# Patient Record
Sex: Female | Born: 1985 | Race: Black or African American | Hispanic: No | Marital: Single | Smoking: Never smoker
Health system: Southern US, Community
[De-identification: ages and names within clinical notes are randomized; demographics above are authoritative.]

## PROBLEM LIST (undated history)

## (undated) ENCOUNTER — Ambulatory Visit (HOSPITAL_COMMUNITY): Admission: EM | Source: Home / Self Care

## (undated) DIAGNOSIS — Z8751 Personal history of pre-term labor: Secondary | ICD-10-CM

## (undated) DIAGNOSIS — I10 Essential (primary) hypertension: Secondary | ICD-10-CM

## (undated) DIAGNOSIS — O24419 Gestational diabetes mellitus in pregnancy, unspecified control: Secondary | ICD-10-CM

## (undated) HISTORY — PX: IUD REMOVAL: SHX5392

## (undated) HISTORY — DX: Gestational diabetes mellitus in pregnancy, unspecified control: O24.419

## (undated) HISTORY — DX: Personal history of pre-term labor: Z87.51

---
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
## 2006-09-03 ENCOUNTER — Emergency Department (HOSPITAL_COMMUNITY): Admission: EM | Admit: 2006-09-03 | Discharge: 2006-09-03 | Payer: Self-pay | Admitting: Family Medicine

## 2007-08-26 ENCOUNTER — Ambulatory Visit: Payer: Self-pay | Admitting: Obstetrics & Gynecology

## 2007-09-02 ENCOUNTER — Ambulatory Visit (HOSPITAL_COMMUNITY): Admission: RE | Admit: 2007-09-02 | Discharge: 2007-09-02 | Payer: Self-pay | Admitting: Family Medicine

## 2007-09-02 ENCOUNTER — Ambulatory Visit: Payer: Self-pay | Admitting: Obstetrics and Gynecology

## 2007-09-09 ENCOUNTER — Ambulatory Visit: Payer: Self-pay | Admitting: Obstetrics & Gynecology

## 2007-09-16 ENCOUNTER — Ambulatory Visit: Payer: Self-pay | Admitting: Obstetrics and Gynecology

## 2007-09-16 ENCOUNTER — Ambulatory Visit (HOSPITAL_COMMUNITY): Admission: RE | Admit: 2007-09-16 | Discharge: 2007-09-16 | Payer: Self-pay | Admitting: Obstetrics & Gynecology

## 2007-09-23 ENCOUNTER — Ambulatory Visit: Payer: Self-pay | Admitting: Obstetrics & Gynecology

## 2007-09-30 ENCOUNTER — Ambulatory Visit: Payer: Self-pay | Admitting: Obstetrics & Gynecology

## 2007-10-07 ENCOUNTER — Ambulatory Visit: Payer: Self-pay | Admitting: Obstetrics & Gynecology

## 2007-10-14 ENCOUNTER — Ambulatory Visit: Payer: Self-pay | Admitting: *Deleted

## 2007-10-21 ENCOUNTER — Ambulatory Visit: Payer: Self-pay | Admitting: Obstetrics and Gynecology

## 2007-10-28 ENCOUNTER — Ambulatory Visit: Payer: Self-pay | Admitting: Family Medicine

## 2007-11-04 ENCOUNTER — Ambulatory Visit: Payer: Self-pay | Admitting: Obstetrics and Gynecology

## 2007-11-11 ENCOUNTER — Ambulatory Visit: Payer: Self-pay | Admitting: Family Medicine

## 2007-11-18 ENCOUNTER — Ambulatory Visit: Payer: Self-pay | Admitting: Obstetrics & Gynecology

## 2007-11-25 ENCOUNTER — Ambulatory Visit: Payer: Self-pay | Admitting: Obstetrics & Gynecology

## 2007-12-02 ENCOUNTER — Ambulatory Visit: Payer: Self-pay | Admitting: Obstetrics & Gynecology

## 2007-12-09 ENCOUNTER — Ambulatory Visit: Payer: Self-pay | Admitting: Obstetrics & Gynecology

## 2007-12-16 ENCOUNTER — Ambulatory Visit: Payer: Self-pay | Admitting: Obstetrics & Gynecology

## 2007-12-23 ENCOUNTER — Ambulatory Visit: Payer: Self-pay | Admitting: Obstetrics & Gynecology

## 2007-12-30 ENCOUNTER — Ambulatory Visit: Payer: Self-pay | Admitting: Obstetrics & Gynecology

## 2008-01-06 ENCOUNTER — Ambulatory Visit: Payer: Self-pay | Admitting: Family Medicine

## 2008-01-11 ENCOUNTER — Ambulatory Visit (HOSPITAL_COMMUNITY): Admission: RE | Admit: 2008-01-11 | Discharge: 2008-01-11 | Payer: Self-pay | Admitting: Family Medicine

## 2008-01-13 ENCOUNTER — Ambulatory Visit: Payer: Self-pay | Admitting: Obstetrics and Gynecology

## 2008-01-20 ENCOUNTER — Ambulatory Visit: Payer: Self-pay | Admitting: *Deleted

## 2008-01-20 ENCOUNTER — Ambulatory Visit (HOSPITAL_COMMUNITY): Admission: RE | Admit: 2008-01-20 | Discharge: 2008-01-20 | Payer: Self-pay | Admitting: *Deleted

## 2008-01-22 ENCOUNTER — Inpatient Hospital Stay (HOSPITAL_COMMUNITY): Admission: AD | Admit: 2008-01-22 | Discharge: 2008-01-25 | Payer: Self-pay | Admitting: Family Medicine

## 2008-01-22 ENCOUNTER — Inpatient Hospital Stay (HOSPITAL_COMMUNITY): Admission: AD | Admit: 2008-01-22 | Discharge: 2008-01-25 | Payer: Self-pay | Admitting: Obstetrics & Gynecology

## 2008-01-22 ENCOUNTER — Ambulatory Visit: Payer: Self-pay | Admitting: Obstetrics and Gynecology

## 2008-03-22 ENCOUNTER — Emergency Department (HOSPITAL_COMMUNITY): Admission: EM | Admit: 2008-03-22 | Discharge: 2008-03-22 | Payer: Self-pay | Admitting: Emergency Medicine

## 2008-07-19 ENCOUNTER — Emergency Department (HOSPITAL_COMMUNITY): Admission: EM | Admit: 2008-07-19 | Discharge: 2008-07-19 | Payer: Self-pay | Admitting: Emergency Medicine

## 2010-03-29 ENCOUNTER — Inpatient Hospital Stay (HOSPITAL_COMMUNITY): Admission: AD | Admit: 2010-03-29 | Discharge: 2010-03-29 | Payer: Self-pay | Admitting: Obstetrics & Gynecology

## 2010-05-30 ENCOUNTER — Ambulatory Visit: Payer: Self-pay | Admitting: Family Medicine

## 2010-06-13 ENCOUNTER — Ambulatory Visit: Payer: Self-pay | Admitting: Obstetrics and Gynecology

## 2010-06-13 ENCOUNTER — Other Ambulatory Visit: Payer: Self-pay

## 2010-06-13 ENCOUNTER — Encounter: Payer: Self-pay | Admitting: Obstetrics and Gynecology

## 2010-06-13 LAB — CONVERTED CEMR LAB
ALT: 17 units/L (ref 0–35)
AST: 15 units/L (ref 0–37)
Albumin: 3.8 g/dL (ref 3.5–5.2)
Alkaline Phosphatase: 51 units/L (ref 39–117)
BUN: 6 mg/dL (ref 6–23)
CO2: 25 meq/L (ref 19–32)
Calcium: 9.2 mg/dL (ref 8.4–10.5)
Chloride: 103 meq/L (ref 96–112)
Collection Interval-CRCL: 24 hr
Creatinine 24 HR UR: 954 mg/24hr (ref 700–1800)
Creatinine Clearance: 116 mL/min — ABNORMAL HIGH (ref 75–115)
Creatinine, Ser: 0.56 mg/dL (ref 0.40–1.20)
Creatinine, Urine: 119.3 mg/dL
Glucose, Bld: 85 mg/dL (ref 70–99)
HCT: 35.4 % — ABNORMAL LOW (ref 36.0–46.0)
Hemoglobin: 11.7 g/dL — ABNORMAL LOW (ref 12.0–15.0)
MCHC: 33.1 g/dL (ref 30.0–36.0)
MCV: 91.5 fL (ref 78.0–100.0)
Platelets: 254 10*3/uL (ref 150–400)
Potassium: 4.1 meq/L (ref 3.5–5.3)
Protein, Ur: 32 mg/24hr — ABNORMAL LOW (ref 50–100)
RBC: 3.87 M/uL (ref 3.87–5.11)
RDW: 13.4 % (ref 11.5–15.5)
Sodium: 137 meq/L (ref 135–145)
Total Bilirubin: 0.4 mg/dL (ref 0.3–1.2)
Total Protein: 6.6 g/dL (ref 6.0–8.3)
WBC: 8.4 10*3/uL (ref 4.0–10.5)

## 2010-06-19 ENCOUNTER — Ambulatory Visit: Payer: Self-pay | Admitting: Obstetrics and Gynecology

## 2010-06-25 ENCOUNTER — Ambulatory Visit (HOSPITAL_COMMUNITY)
Admission: RE | Admit: 2010-06-25 | Discharge: 2010-06-25 | Payer: Self-pay | Source: Home / Self Care | Admitting: Family Medicine

## 2010-06-25 ENCOUNTER — Encounter: Payer: Self-pay | Admitting: Family Medicine

## 2010-06-26 ENCOUNTER — Ambulatory Visit: Payer: Self-pay | Admitting: Obstetrics and Gynecology

## 2010-07-04 ENCOUNTER — Encounter: Payer: Self-pay | Admitting: Physician Assistant

## 2010-07-04 ENCOUNTER — Ambulatory Visit: Payer: Self-pay | Admitting: Obstetrics & Gynecology

## 2010-07-11 ENCOUNTER — Ambulatory Visit: Payer: Self-pay | Admitting: Obstetrics & Gynecology

## 2010-07-18 ENCOUNTER — Ambulatory Visit: Payer: Self-pay | Admitting: Family Medicine

## 2010-07-25 ENCOUNTER — Ambulatory Visit: Payer: Self-pay | Admitting: Obstetrics & Gynecology

## 2010-08-01 ENCOUNTER — Ambulatory Visit
Admission: RE | Admit: 2010-08-01 | Discharge: 2010-08-01 | Payer: Self-pay | Source: Home / Self Care | Attending: Family Medicine | Admitting: Family Medicine

## 2010-08-01 LAB — POCT URINALYSIS DIPSTICK
Bilirubin Urine: NEGATIVE
Hemoglobin, Urine: NEGATIVE
Ketones, ur: NEGATIVE mg/dL
Nitrite: NEGATIVE
Protein, ur: NEGATIVE mg/dL
Specific Gravity, Urine: 1.02 (ref 1.005–1.030)
Urine Glucose, Fasting: NEGATIVE mg/dL
Urobilinogen, UA: 1 mg/dL (ref 0.0–1.0)
pH: 8 (ref 5.0–8.0)

## 2010-08-08 ENCOUNTER — Ambulatory Visit
Admission: RE | Admit: 2010-08-08 | Discharge: 2010-08-08 | Payer: Self-pay | Source: Home / Self Care | Attending: Obstetrics and Gynecology | Admitting: Obstetrics and Gynecology

## 2010-08-15 ENCOUNTER — Ambulatory Visit
Admission: RE | Admit: 2010-08-15 | Discharge: 2010-08-15 | Payer: Self-pay | Source: Home / Self Care | Attending: Obstetrics & Gynecology | Admitting: Obstetrics & Gynecology

## 2010-08-15 ENCOUNTER — Ambulatory Visit: Admit: 2010-08-15 | Payer: Self-pay | Admitting: Obstetrics and Gynecology

## 2010-08-22 ENCOUNTER — Ambulatory Visit
Admission: RE | Admit: 2010-08-22 | Discharge: 2010-08-22 | Payer: Self-pay | Source: Home / Self Care | Attending: Obstetrics and Gynecology | Admitting: Obstetrics and Gynecology

## 2010-08-22 ENCOUNTER — Encounter: Payer: Self-pay | Admitting: Obstetrics & Gynecology

## 2010-08-22 LAB — POCT URINALYSIS DIPSTICK
Bilirubin Urine: NEGATIVE
Ketones, ur: NEGATIVE mg/dL
Nitrite: NEGATIVE
Protein, ur: NEGATIVE mg/dL
Specific Gravity, Urine: 1.025 (ref 1.005–1.030)
Urine Glucose, Fasting: NEGATIVE mg/dL
Urobilinogen, UA: 1 mg/dL (ref 0.0–1.0)
pH: 7 (ref 5.0–8.0)

## 2010-08-22 LAB — CONVERTED CEMR LAB
HCT: 33.8 % — ABNORMAL LOW (ref 36.0–46.0)
HIV: NONREACTIVE
Hemoglobin: 11 g/dL — ABNORMAL LOW (ref 12.0–15.0)
MCHC: 32.5 g/dL (ref 30.0–36.0)
MCV: 93.4 fL (ref 78.0–100.0)
Platelets: 233 10*3/uL (ref 150–400)
RBC: 3.62 M/uL — ABNORMAL LOW (ref 3.87–5.11)
RDW: 13.7 % (ref 11.5–15.5)
WBC: 7.4 10*3/uL (ref 4.0–10.5)

## 2010-08-29 ENCOUNTER — Ambulatory Visit: Payer: Self-pay

## 2010-08-29 ENCOUNTER — Encounter: Payer: Self-pay | Admitting: Obstetrics & Gynecology

## 2010-08-29 DIAGNOSIS — O09219 Supervision of pregnancy with history of pre-term labor, unspecified trimester: Secondary | ICD-10-CM

## 2010-09-05 ENCOUNTER — Other Ambulatory Visit: Payer: Self-pay

## 2010-09-05 DIAGNOSIS — O09219 Supervision of pregnancy with history of pre-term labor, unspecified trimester: Secondary | ICD-10-CM

## 2010-09-05 LAB — POCT URINALYSIS DIPSTICK
Bilirubin Urine: NEGATIVE
Hgb urine dipstick: NEGATIVE
Ketones, ur: NEGATIVE mg/dL
Nitrite: NEGATIVE
Protein, ur: NEGATIVE mg/dL
Specific Gravity, Urine: 1.02 (ref 1.005–1.030)
Urine Glucose, Fasting: NEGATIVE mg/dL
Urobilinogen, UA: 0.2 mg/dL (ref 0.0–1.0)
pH: 7 (ref 5.0–8.0)

## 2010-09-12 ENCOUNTER — Ambulatory Visit: Payer: Medicaid Other

## 2010-09-12 DIAGNOSIS — O09219 Supervision of pregnancy with history of pre-term labor, unspecified trimester: Secondary | ICD-10-CM

## 2010-09-19 ENCOUNTER — Other Ambulatory Visit: Payer: Self-pay

## 2010-09-19 DIAGNOSIS — O219 Vomiting of pregnancy, unspecified: Secondary | ICD-10-CM

## 2010-09-19 DIAGNOSIS — O09219 Supervision of pregnancy with history of pre-term labor, unspecified trimester: Secondary | ICD-10-CM

## 2010-09-19 LAB — POCT URINALYSIS DIPSTICK
Bilirubin Urine: NEGATIVE
Hgb urine dipstick: NEGATIVE
Ketones, ur: NEGATIVE mg/dL
Nitrite: NEGATIVE
Protein, ur: NEGATIVE mg/dL
Specific Gravity, Urine: 1.02 (ref 1.005–1.030)
Urine Glucose, Fasting: NEGATIVE mg/dL
Urobilinogen, UA: 0.2 mg/dL (ref 0.0–1.0)
pH: 7 (ref 5.0–8.0)

## 2010-09-19 LAB — GLUCOSE, CAPILLARY: Glucose-Capillary: 87 mg/dL (ref 70–99)

## 2010-09-26 ENCOUNTER — Ambulatory Visit: Payer: Medicaid Other

## 2010-09-26 ENCOUNTER — Other Ambulatory Visit: Payer: Self-pay | Admitting: Family Medicine

## 2010-09-26 DIAGNOSIS — I10 Essential (primary) hypertension: Secondary | ICD-10-CM

## 2010-09-26 DIAGNOSIS — O09219 Supervision of pregnancy with history of pre-term labor, unspecified trimester: Secondary | ICD-10-CM

## 2010-09-26 DIAGNOSIS — Z8751 Personal history of pre-term labor: Secondary | ICD-10-CM

## 2010-09-30 ENCOUNTER — Ambulatory Visit (HOSPITAL_COMMUNITY)
Admission: RE | Admit: 2010-09-30 | Discharge: 2010-09-30 | Disposition: A | Discharge status: Home / Self Care | Payer: Medicaid Other | Source: Ambulatory Visit | Attending: Family Medicine | Admitting: Family Medicine

## 2010-09-30 ENCOUNTER — Inpatient Hospital Stay (HOSPITAL_COMMUNITY)
Admission: AD | Admit: 2010-09-30 | Discharge: 2010-10-01 | DRG: 778 | Disposition: A | Discharge status: Home / Self Care | Payer: Medicaid Other | Source: Ambulatory Visit | Attending: Obstetrics & Gynecology | Admitting: Obstetrics & Gynecology

## 2010-09-30 ENCOUNTER — Encounter (HOSPITAL_COMMUNITY): Payer: Self-pay

## 2010-09-30 DIAGNOSIS — Z8751 Personal history of pre-term labor: Secondary | ICD-10-CM

## 2010-09-30 DIAGNOSIS — O139 Gestational [pregnancy-induced] hypertension without significant proteinuria, unspecified trimester: Secondary | ICD-10-CM | POA: Insufficient documentation

## 2010-09-30 DIAGNOSIS — I10 Essential (primary) hypertension: Secondary | ICD-10-CM

## 2010-09-30 DIAGNOSIS — O26879 Cervical shortening, unspecified trimester: Secondary | ICD-10-CM | POA: Diagnosis present

## 2010-09-30 DIAGNOSIS — Z3689 Encounter for other specified antenatal screening: Secondary | ICD-10-CM | POA: Insufficient documentation

## 2010-09-30 DIAGNOSIS — O4700 False labor before 37 completed weeks of gestation, unspecified trimester: Principal | ICD-10-CM | POA: Diagnosis present

## 2010-10-01 LAB — GLUCOSE, CAPILLARY: Glucose-Capillary: 142 mg/dL — ABNORMAL HIGH (ref 70–99)

## 2010-10-03 ENCOUNTER — Other Ambulatory Visit: Payer: Self-pay

## 2010-10-03 DIAGNOSIS — O09219 Supervision of pregnancy with history of pre-term labor, unspecified trimester: Secondary | ICD-10-CM

## 2010-10-03 DIAGNOSIS — O169 Unspecified maternal hypertension, unspecified trimester: Secondary | ICD-10-CM

## 2010-10-03 LAB — POCT URINALYSIS DIPSTICK
Bilirubin Urine: NEGATIVE
Glucose, UA: NEGATIVE mg/dL
Ketones, ur: NEGATIVE mg/dL
Nitrite: NEGATIVE
Protein, ur: 30 mg/dL — AB
Specific Gravity, Urine: 1.02 (ref 1.005–1.030)
Urobilinogen, UA: 0.2 mg/dL (ref 0.0–1.0)
pH: 7 (ref 5.0–8.0)

## 2010-10-04 NOTE — Discharge Summary (Signed)
  NAME:  Mcphillips, Annis                  ACCOUNT NO.:  [id number]  MEDICAL RECORD NO.:  [id number]           PATIENT TYPE:  I  LOCATION:  9155                          [facility]  [facility]  PHYSICIAN:  Scheryl Darter, MD       DATE OF BIRTH:  1985/11/01  DATE OF ADMISSION:  09/30/2010 DATE OF DISCHARGE:  10/01/2010                              DISCHARGE SUMMARY   DIAGNOSES: 1. Intrauterine pregnancy at 32 weeks and 3 days' gestation. 2. History of preterm birth. 3. Threatened preterm labor.  The patient is a 25 year old black female, gravida 3, para 0-2-0-2, estimated date of confinement November 23, 2010.  She presented with ultrasound that demonstrated short cervix and cervical funneling. Because of her history preterm birth, she was receiving 17- hydroxyprogesterone caproate injections weekly at the [facility] at [facility].  The patient denied contractions, cramps, vaginal bleeding, or leaking fluid.  She had a good fetal movement.  PAST MEDICAL HISTORY:  Hypertension during her first pregnancy.  PAST SURGICAL HISTORY:  None.  MEDICATIONS: 1. 17-hydroxyprogesterone caproate 250 mg IM very week. 2. Tums daily for heartburn. 3. Prenatal vitamin daily.  No known drug allergies.  PHYSICAL EXAMINATION:  GENERAL:  No acute distress. VITAL SIGNS:  Stable, afebrile. CHEST:  Clear. HEART:  Regular rate and rhythm. ABDOMEN:  Gravid consistent with dates, nontender. PELVIC:  Cervix was 1.5 cm dilated, 80% effaced, -2 station, lower uterine segment bulging.  Ultrasound showed cervix 1.4 cm long with funneling.  Normal amniotic fluid.  Normal growth.  The patient was admitted to antenatal for observation.  She had contractions demonstrated on monitor about every 10 minutes.  She was given Procardia p.o. and contractions dissipated.  She had contractions next day.  She received two doses of betamethasone 12 mg IM 24 hours apart.  Cervix was checked at 1500 hours on October 01, 2010  and cervix was 50% effaced, fingertip, -3 station, posterior.  IMPRESSION: 1. Intrauterine pregnancy at 32 weeks, 3 days' gestation with     threatened preterm labor. 2. Short cervix. 3. History of preterm birth.  PLAN:  The patient was discharged home.  She was continued Procardia 30 mg XL 1 p.o. daily.  She was scheduled to return on October 03, 2010 at [facility] for her next 17 P injection.  Because of her heart burn, which was not relieved by antacids, I gave her a prescription for Protonix 40 mg p.o. daily.  I gave her precautions for preterm labor.     Scheryl Darter, MD     JA/MEDQ  D:  10/01/2010  T:  10/02/2010  Job:  [id number]  Electronically Signed by Scheryl Darter MD on 10/03/2010 01:54:36 PM

## 2010-10-07 LAB — POCT URINALYSIS DIPSTICK
Bilirubin Urine: NEGATIVE
Bilirubin Urine: NEGATIVE
Glucose, UA: NEGATIVE mg/dL
Glucose, UA: NEGATIVE mg/dL
Hgb urine dipstick: NEGATIVE
Ketones, ur: NEGATIVE mg/dL
Ketones, ur: NEGATIVE mg/dL
Nitrite: NEGATIVE
Nitrite: NEGATIVE
Protein, ur: NEGATIVE mg/dL
Protein, ur: NEGATIVE mg/dL
Specific Gravity, Urine: 1.02 (ref 1.005–1.030)
Specific Gravity, Urine: 1.02 (ref 1.005–1.030)
Urobilinogen, UA: 1 mg/dL (ref 0.0–1.0)
Urobilinogen, UA: 0.2 mg/dL (ref 0.0–1.0)
pH: 8.5 — ABNORMAL HIGH (ref 5.0–8.0)
pH: 7.5 (ref 5.0–8.0)

## 2010-10-08 LAB — POCT URINALYSIS DIPSTICK
Bilirubin Urine: NEGATIVE
Bilirubin Urine: NEGATIVE
Bilirubin Urine: NEGATIVE
Glucose, UA: NEGATIVE mg/dL
Glucose, UA: NEGATIVE mg/dL
Glucose, UA: NEGATIVE mg/dL
Hgb urine dipstick: NEGATIVE
Ketones, ur: NEGATIVE mg/dL
Ketones, ur: NEGATIVE mg/dL
Ketones, ur: NEGATIVE mg/dL
Nitrite: NEGATIVE
Nitrite: NEGATIVE
Nitrite: NEGATIVE
Protein, ur: NEGATIVE mg/dL
Protein, ur: NEGATIVE mg/dL
Protein, ur: NEGATIVE mg/dL
Specific Gravity, Urine: 1.025 (ref 1.005–1.030)
Specific Gravity, Urine: 1.025 (ref 1.005–1.030)
Specific Gravity, Urine: >=1.03 (ref 1.005–1.030)
Urobilinogen, UA: 0.2 mg/dL (ref 0.0–1.0)
Urobilinogen, UA: 0.2 mg/dL (ref 0.0–1.0)
Urobilinogen, UA: 1 mg/dL (ref 0.0–1.0)
pH: 7 (ref 5.0–8.0)
pH: 7 (ref 5.0–8.0)
pH: 7.5 (ref 5.0–8.0)

## 2010-10-10 ENCOUNTER — Other Ambulatory Visit: Payer: Medicaid Other

## 2010-10-10 ENCOUNTER — Other Ambulatory Visit: Payer: Self-pay

## 2010-10-10 DIAGNOSIS — O09219 Supervision of pregnancy with history of pre-term labor, unspecified trimester: Secondary | ICD-10-CM

## 2010-10-10 LAB — URINALYSIS, ROUTINE W REFLEX MICROSCOPIC
Bilirubin Urine: NEGATIVE
Glucose, UA: NEGATIVE mg/dL
Ketones, ur: NEGATIVE mg/dL
Nitrite: NEGATIVE
Protein, ur: NEGATIVE mg/dL
Specific Gravity, Urine: 1.02 (ref 1.005–1.030)
Urobilinogen, UA: 0.2 mg/dL (ref 0.0–1.0)
pH: 6.5 (ref 5.0–8.0)

## 2010-10-10 LAB — GLUCOSE, CAPILLARY: Glucose-Capillary: 96 mg/dL (ref 70–99)

## 2010-10-10 LAB — CBC
MCHC: 33.9 g/dL (ref 30.0–36.0)
Platelets: 257 10*3/uL (ref 150–400)
RDW: 13.2 % (ref 11.5–15.5)
WBC: 6.7 10*3/uL (ref 4.0–10.5)

## 2010-10-10 LAB — URINE MICROSCOPIC-ADD ON

## 2010-10-10 LAB — URINE CULTURE
Colony Count: >=100000
Culture  Setup Time: 201109022026

## 2010-10-10 LAB — HCG, QUANTITATIVE, PREGNANCY: hCG, Beta Chain, Quant, S: 16953 m[IU]/mL — ABNORMAL HIGH (ref <5)

## 2010-10-10 LAB — WET PREP, GENITAL: Yeast Wet Prep HPF POC: NONE SEEN

## 2010-10-10 LAB — ABO/RH: ABO/RH(D): O POS

## 2010-10-10 LAB — POCT PREGNANCY, URINE: Preg Test, Ur: POSITIVE

## 2010-10-17 ENCOUNTER — Other Ambulatory Visit: Payer: Self-pay | Admitting: Obstetrics & Gynecology

## 2010-10-17 DIAGNOSIS — O09219 Supervision of pregnancy with history of pre-term labor, unspecified trimester: Secondary | ICD-10-CM

## 2010-10-17 LAB — POCT URINALYSIS DIP (DEVICE)
Glucose, UA: NEGATIVE mg/dL
Ketones, ur: 80 mg/dL — AB
Nitrite: POSITIVE — AB
pH: 6 (ref 5.0–8.0)

## 2010-10-18 ENCOUNTER — Encounter: Payer: Self-pay | Admitting: Obstetrics & Gynecology

## 2010-10-24 ENCOUNTER — Ambulatory Visit: Payer: Medicaid Other

## 2010-10-24 DIAGNOSIS — O09219 Supervision of pregnancy with history of pre-term labor, unspecified trimester: Secondary | ICD-10-CM

## 2010-10-31 ENCOUNTER — Other Ambulatory Visit: Payer: Self-pay | Admitting: Obstetrics and Gynecology

## 2010-10-31 DIAGNOSIS — O09219 Supervision of pregnancy with history of pre-term labor, unspecified trimester: Secondary | ICD-10-CM

## 2010-10-31 LAB — POCT URINALYSIS DIP (DEVICE)
Bilirubin Urine: NEGATIVE
Ketones, ur: NEGATIVE mg/dL
Protein, ur: 30 mg/dL — AB
Specific Gravity, Urine: 1.02 (ref 1.005–1.030)
pH: 7 (ref 5.0–8.0)

## 2010-11-01 ENCOUNTER — Inpatient Hospital Stay (HOSPITAL_COMMUNITY)
Admission: AD | Admit: 2010-11-01 | Discharge: 2010-11-04 | DRG: 775 | Disposition: A | Discharge status: Home / Self Care | Payer: Medicaid Other | Source: Ambulatory Visit | Attending: Obstetrics & Gynecology | Admitting: Obstetrics & Gynecology

## 2010-11-01 LAB — CBC
HCT: 37.1 % (ref 36.0–46.0)
Hemoglobin: 12.3 g/dL (ref 12.0–15.0)
MCHC: 33.2 g/dL (ref 30.0–36.0)
MCV: 92.8 fL (ref 78.0–100.0)
WBC: 9 10*3/uL (ref 4.0–10.5)

## 2010-11-15 ENCOUNTER — Ambulatory Visit: Payer: Medicaid Other | Admitting: Family Medicine

## 2010-11-18 ENCOUNTER — Ambulatory Visit (INDEPENDENT_AMBULATORY_CARE_PROVIDER_SITE_OTHER): Payer: Medicaid Other | Admitting: Physician Assistant

## 2010-11-18 DIAGNOSIS — F329 Major depressive disorder, single episode, unspecified: Secondary | ICD-10-CM

## 2010-11-19 NOTE — Group Therapy Note (Signed)
NAME:  [name] [name]                  ACCOUNT NO.:  [id number]  MEDICAL RECORD NO.:  [id number]           PATIENT TYPE:  A  LOCATION:  [facility]                   FACILITY:  [facility]  PHYSICIAN:  Maylon Cos, CNM    DATE OF BIRTH:  09/20/1985  DATE OF SERVICE:  11/18/2010                                 CLINIC NOTE  The patient is being seen in GYN Clinic at [facility].  REASON FOR TODAY'S VISIT:  Two week postpartum exam and complaints of depression.  HISTORY OF PRESENT ILLNESS:  The patient is 2 weeks status post spontaneous vaginal delivery of a live female infant weighing 5 pounds and 9 ounces with a 9 and 9 Apgars.  Infant was delivered by [name] [name], nurse midwife.  Her pregnancy was complicated by history of 2 preterm births and chronic hypertension.  She returns today with complaints of depression.  She has uncontrolled crying and does state that approximately 5 days after delivery, she had thoughts of harming the baby, she did call her sister.  Her sister quickly came over and she was able to remove herself from the situation.  She states that she is now having improved bonding with the baby.  She is now picking the baby up where as the first week of life, she was not bonding at all, not picking the baby up at all.  She is tearful during our interaction and is desiring to help for her symptoms.  OTHER COMPLICATIONS:  She has had many tragedies in her recent history. Her boyfriend passed away in a shooting on New Year's Eve and her brother who she is very close to was recently incarcerated.  Her support system is mentally her sister right now who is also pregnant and is preoccupied by the upcoming arrival of her new baby.  Her [place] postpartum depression screen today scores an 18.  She is amenable to counseling and medication which we will start.  PHYSICAL EXAMINATION:  GENERAL:  Natasha [name] is a 25 year old African American female who is in no apparent distress. HEENT:   Grossly normal. VITAL SIGNS:  Stable.  Her temperature is 99.1, her pulse is 95, her blood pressure is 134/91, her weight is 134.2, and her height is 60 inches secondary to no complaints of the physical nature.  All her further examination was deferred secondary to the priority of her emotional state.  We will recheck blood pressure prior to leaving clinic today.  ASSESSMENT:  Postpartum depression, moderate to severe.  PLAN: 1. Start Zoloft 50 mg one p.o. bedtime. 2. Social worker to see patient in clinic today to refer for     Counseling Services immediately. 3. We have reviewed emergency need, if she is feeling symptoms of     harming herself or her baby, she is unable to stay at her resources     in the community and going to the ER.  FOLLOWUP:  The patient should follow up in 3 weeks in clinic or p.r.n. before if needed.          ______________________________ Maylon Cos, CNM    SS/MEDQ  D:  11/18/2010  T:  11/19/2010  Job:  [id number]

## 2010-11-25 ENCOUNTER — Ambulatory Visit: Payer: Medicaid Other | Admitting: Physician Assistant

## 2010-12-11 ENCOUNTER — Other Ambulatory Visit: Payer: Self-pay | Admitting: Physician Assistant

## 2010-12-11 ENCOUNTER — Ambulatory Visit (INDEPENDENT_AMBULATORY_CARE_PROVIDER_SITE_OTHER): Payer: Medicaid Other | Admitting: Physician Assistant

## 2010-12-11 DIAGNOSIS — Z30430 Encounter for insertion of intrauterine contraceptive device: Secondary | ICD-10-CM

## 2010-12-11 LAB — POCT PREGNANCY, URINE: Preg Test, Ur: NEGATIVE

## 2010-12-12 NOTE — Group Therapy Note (Signed)
NAME:  Natasha [name], Natasha [name]                  ACCOUNT NO.:  [id number]  MEDICAL RECORD NO.:  [id number]           PATIENT TYPE:  A  LOCATION:  [facility]                   FACILITY:  [facility]  PHYSICIAN:  Maylon Cos, CNM    DATE OF BIRTH:  04-Apr-1986  DATE OF SERVICE:  12/11/2010                                 CLINIC NOTE  REASON FOR TODAY'S VISIT:  IUD insertion and recheck of postpartum depression.  She is approximately 4 weeks status post from her previous visit, at which time she was 2 weeks postpartum with complaints of depression.  At that time, she was started on 50 mg of Zoloft at bedtime.  She says that she is feeling much better if she elected the Zoloft twice and she does state that she is feeling better, not feeling that she needs medications at this time.  She has not followed up with the social worker community counseling as recommended, however, she does have community resources available and is able to verbalize them.  She is interested in having Mirena IUD inserted today.  Her informed consent was obtained and time- out was done.  The patient was placed in lithotomy position.  Graves speculum was used to visualize the paracervix which was visually dilated to approximately 1/2 cm.  Cervix was prepared with Betadine solution x3. Tenaculum was placed at 4 o'clock on the cervix and uterus was sounded to approximately 7 cm.  Mirena IUD introducer was calibrated to the same and Mirena IUD was inserted without difficulty into the uterus.  IUD strings were trimmed to approximately 1 cm below the external os. Tenaculum was removed and Fox swab was used to remain hemostasis from the removal of the tenaculum.  Speculum was removed.  The patient tolerated this procedure well.  Post medications of 600 mg of ibuprofen were given for uterine cramping.  ASSESSMENT: 1. Postpartum depression, stable. 2. Mirena intrauterine device insertion.  PLAN: 1. The patient should follow up in 5-6  weeks for IUD string check. 2. The patient should follow up with community counseling and social     work as needed for depression management or sooner here in our     clinic p.r.n. problems.          ______________________________ Maylon Cos, CNM    SS/MEDQ  D:  12/11/2010  T:  12/12/2010  Job:  [id number]

## 2010-12-13 NOTE — Discharge Summary (Signed)
[facility]  Patient:    Natasha [name], Natasha [name] Visit Number: [id number] MRN: [id number]          Service Type: OBS Location: 910A 9109 [id number] Attending Physician:  Antionette Char Dictated by:   Marvis Moeller, C.N.M. Adm. Date:  [id number] Disc. Date: [id number]                             Discharge Summary  ADMISSION DIAGNOSES:          1. A 29-2/7 weeks intrauterine pregnancy.                               2. Preterm cervical contractions.  DISCHARGE DIAGNOSES:          1. A 30-3/7 weeks intrauterine pregnancy.                               2. Trichomonas vaginitis.                               3. Chlamydia cervicitis.                               4. Preterm contractions resolved.  HISTORY:                      This is a 25 year old primigravida who presented with preterm uterine contractions noted at her clinic visit noted earlier in the day. She was sent to [facility] for further evaluation. Her best dating criteria was by a 26-week ultrasound. It was nearly three weeks discrepant with her menstrual dating history. She was unaware of having contractions. She denied leaking or bleeding. Fetal activity was good. She had reported a yellow greenish vaginal discharge. Her cultures for beta strep and STDs had been done at the clinic prior to her arrival at [facility]. Her wet prep showed too-numerous-too-count WBCs, 3+ bacteria, and large amount of Trichomonas.  PREGNANCY COURSE:             Her prenatal care was at [facility] with onset at 23 weeks and to this point had been uncomplicated.  MEDICATIONS:                  Prenatal vitamin one a day.  ALLERGIES:                    None.  OBSTETRICAL HISTORY:          Primigravida.  MEDICAL AND GYNECOLOGIC HISTORY:                      Significant of having had Chlamydia and Trichomonas in the past.  FAMILY AND SOCIAL HISTORY:    She is single. Denied any drug, alcohol,  tobacco use.  PRENATAL LABORATORY DATA:     Significant for negative GC, positive Chlamydia, which was treated a couple of weeks ago at [facility] and she had also had Trichomonas vaginitis during the pregnancy.  Her ultrasound done at 26 weeks gave a cervical length of 3.1 and normal fluid.  ADMISSION PHYSICAL EXAMINATION:  Vital signs were stable with blood pressure 126/71, temperature 98.5. General: NAD. Abdomen was nontender. There was no edema in her lower extremities. Heart and lung exam were later found to be normal. Her speculum exam was not repeated on admission. The digital cervical exam showed a cervix that was 1 cm, posterior, 20% with a vertex at a high station. Fetal heart rate was 135 with good variability and no decelerations. Contractions were occurring every seven to 12 minutes lasting about 90 seconds and there was also uterine irritability.  HOSPITAL COURSE:              The patient was given IV antibiotics, Unasyn, and it was noted that her contractions continued onto day #2 of hospitalization despite being given the Unasyn and Procardia. For that reason, she was started on magnesium sulfate 4 gram load and 2 g/hr. This was discontinued one day later since her cervix exam was unchanged and she was not having uterine activity. On day of discharge, she was placed on oral terbutaline x one day which was controlling her uterine activity very well. She denied leaking or bleeding. She was not feeling any contractions. Vital signs were stable. An ultrasound done on January 26, 2001 showed good fetal growth and a cervical length of 3.3. The vaginal exam was posterior, external fingertip, long; some lower uterine segment development. Fetal heart rate was stable, 155 baseline. She was discharged home on modified bed rest with oral terbutaline 2.5 mg one p.o. q.4h. and prenatal vitamin one p.o. q.d. She expressed that she would be able to follow the pelvic  precautions and the modified bed rest.  DISCHARGE FOLLOWUP:           The patient is to follow up at [facility] in one week.  CONDITION ON DISCHARGE:       Good. DD:  03/15/01 TD:  03/15/01 Job: [id number]

## 2011-01-16 ENCOUNTER — Other Ambulatory Visit: Payer: Self-pay | Admitting: Obstetrics & Gynecology

## 2011-01-16 ENCOUNTER — Ambulatory Visit (INDEPENDENT_AMBULATORY_CARE_PROVIDER_SITE_OTHER): Payer: Medicaid Other | Admitting: Physician Assistant

## 2011-01-16 DIAGNOSIS — Z30431 Encounter for routine checking of intrauterine contraceptive device: Secondary | ICD-10-CM

## 2011-01-16 DIAGNOSIS — K113 Abscess of salivary gland: Secondary | ICD-10-CM

## 2011-01-22 ENCOUNTER — Ambulatory Visit (HOSPITAL_COMMUNITY)
Admission: RE | Admit: 2011-01-22 | Discharge: 2011-01-22 | Disposition: A | Discharge status: Home / Self Care | Payer: Medicaid Other | Source: Ambulatory Visit | Attending: Family Medicine | Admitting: Family Medicine

## 2011-01-22 ENCOUNTER — Other Ambulatory Visit: Payer: Self-pay | Admitting: Family Medicine

## 2011-01-22 ENCOUNTER — Other Ambulatory Visit (HOSPITAL_COMMUNITY): Payer: Medicaid Other

## 2011-01-22 ENCOUNTER — Ambulatory Visit: Payer: Medicaid Other | Admitting: Obstetrics & Gynecology

## 2011-01-22 ENCOUNTER — Ambulatory Visit (HOSPITAL_COMMUNITY): Payer: Medicaid Other

## 2011-01-22 ENCOUNTER — Ambulatory Visit (HOSPITAL_COMMUNITY)
Admission: RE | Admit: 2011-01-22 | Discharge: 2011-01-22 | Disposition: A | Discharge status: Home / Self Care | Payer: Medicaid Other | Source: Ambulatory Visit | Attending: Obstetrics & Gynecology | Admitting: Obstetrics & Gynecology

## 2011-01-22 ENCOUNTER — Encounter (HOSPITAL_COMMUNITY): Payer: Self-pay

## 2011-01-22 DIAGNOSIS — Z3049 Encounter for surveillance of other contraceptives: Secondary | ICD-10-CM

## 2011-01-22 DIAGNOSIS — Z01818 Encounter for other preprocedural examination: Secondary | ICD-10-CM

## 2011-01-22 DIAGNOSIS — Z30431 Encounter for routine checking of intrauterine contraceptive device: Secondary | ICD-10-CM | POA: Insufficient documentation

## 2011-01-22 DIAGNOSIS — N83209 Unspecified ovarian cyst, unspecified side: Secondary | ICD-10-CM | POA: Insufficient documentation

## 2011-01-22 DIAGNOSIS — T839XXA Unspecified complication of genitourinary prosthetic device, implant and graft, initial encounter: Secondary | ICD-10-CM

## 2011-01-22 DIAGNOSIS — N949 Unspecified condition associated with female genital organs and menstrual cycle: Secondary | ICD-10-CM | POA: Insufficient documentation

## 2011-01-22 MED ORDER — IOHEXOL 300 MG/ML  SOLN
100.0000 mL | Freq: Once | INTRAMUSCULAR | Status: AC | PRN
  Administered 2011-01-22: 100 mL via INTRAVENOUS

## 2011-01-23 NOTE — Group Therapy Note (Signed)
NAME:  Natasha [name], Natasha [name]                  ACCOUNT NO.:  [id number]  MEDICAL RECORD NO.:  [id number]           PATIENT TYPE:  A  LOCATION:  [facility]                   [facility]ACILITY:  [facility]  PHYSICIAN:  Jaynie Collins, MD     DATE OF BIRTH:  May 06, 1986  DATE OF SERVICE:  01/22/2011                                 CLINIC NOTE  REASON FOR VISIT:  Discussion of management of misplaced IUD.  HISTORY OF PRESENT ILLNESS:  The patient is a 25 year old gravida 3 para 1-2-0-3 who is here after recent diagnosis of a misplaced IUD.  The patient had a Mirena that was placed on Dec 11, 2010, without any complication.  However, when she came for her string check, the IUD strings were not able to be visualized, and she was sent for ultrasound. The ultrasound did not visualize the IUD and so she underwent more imaging modalities including a CT scan which apparently showed that the IUD was outside the uterus and overlying the sigmoid colon.  The patient was sent down here for further discussion and management.  She does report having occasional abdominal cramping but no other concerns.  PAST MEDICAL AND SURGICAL HISTORY:  Unremarkable.  PAST OB/GYN HISTORY:  The patient has had all vaginal deliveries.  She does have a history of Trichomonas and Chlamydia and abnormal Pap smears in the past but none recently.  MEDICATIONS:  Mirena IUD.  ALLERGIES:  No known drug allergies.  SOCIAL HISTORY:  The patient denies any smoking, alcohol, or illicit drug use.  FAMILY HISTORY:  Noncontributory.  PHYSICAL EXAMINATION:  VITAL SIGNS:  Temperature is 98.8, pulse 74, blood pressure 124/82, weight 136.7 pounds, height 60 inches. GENERAL:  No apparent distress. ABDOMEN:  Soft, nontender, and nondistended. LUNGS:  Clear to auscultation bilaterally. HEART:  Regular rate and rhythm. EXTREMITIES:  No cyanosis, clubbing, or edema. PELVIC:  Deferred.  ASSESSMENT/PLAN:  The patient is a 25 year old gravida 3, para  1-2-0-3 with a misplaced IUD.  The patient was counseled regarding needing laparoscopy and removal of the IUD.  The risk of the surgery were explained in detail including but not limited to bleeding, infection, injury to surrounding organs, need for additional procedures.  All her questions were answered.  The patient does say she is going to use condoms in the meantime for contraception and is planning to Implanon after all this is said and done.  Her surgery was booked on February 06, 2011 at 3:00 p.m. and routine preoperative instructions were given to the patient.  She was told to call or come back in if she has any worsening pain or any other concerning symptoms.          ______________________________ Jaynie Collins, MD    UA/MEDQ  D:  01/22/2011  T:  01/23/2011  Job:  [id number]

## 2011-02-04 ENCOUNTER — Encounter (HOSPITAL_COMMUNITY)
Admission: RE | Admit: 2011-02-04 | Discharge: 2011-02-04 | Disposition: A | Discharge status: Home / Self Care | Payer: Medicaid Other | Source: Ambulatory Visit | Attending: Obstetrics & Gynecology | Admitting: Obstetrics & Gynecology

## 2011-02-04 ENCOUNTER — Encounter (HOSPITAL_COMMUNITY): Payer: Self-pay

## 2011-02-04 LAB — CBC
HCT: 40.3 % (ref 36.0–46.0)
Hemoglobin: 13 g/dL (ref 12.0–15.0)
MCV: 93.9 fL (ref 78.0–100.0)
WBC: 7.4 10*3/uL (ref 4.0–10.5)

## 2011-02-04 LAB — SURGICAL PCR SCREEN: Staphylococcus aureus: NEGATIVE

## 2011-02-04 NOTE — Patient Instructions (Addendum)
20 Natasha [name]  02/04/2011   Your procedure is scheduled on:  Thursday  Report to [facility] at1:30PM Call this number if you have problems the morning of surgery: [phone]   Remember:   Do not eat food:After Midnight.  Do not drink clear liquids: 4 Hours before arrival.  Take these medicines the morning of surgery with A SIP OF WATER: N/A   Do not wear jewelry, make-up or nail polish.  Do not bring valuables to the hospital.  Contacts, dentures or bridgework may not be worn into surgery.  Leave suitcase in the car. After surgery it may be brought to your room.  For patients admitted to the hospital, checkout time is 11:00 AM the day of discharge.   Patients discharged the day of surgery will not be allowed to drive home.  Name and phone number of your driver: x  Special Instructions:    Please read over the following fact sheets that you were given:

## 2011-02-05 NOTE — H&P (Signed)
  REASON FOR VISIT: Discussion of management of misplaced IUD.  HISTORY OF PRESENT ILLNESS: The patient is Natasha [name] 25 year old gravida 3 para 1-2-0-3 who is here after recent diagnosis of Natasha [name] misplaced IUD. The patient had Natasha [name] Mirena that was placed on Dec 11, 2010, without any complication. However, when she came for her string check, the IUD strings were not able to be visualized, and she was sent for ultrasound. The ultrasound did not visualize the IUD and so she underwent more imaging modalities including Natasha [name] CT scan which apparently showed that the IUD was outside the uterus and overlying the sigmoid colon. The patient was sent to clinic for further discussion and management. She does report having occasional abdominal cramping but no other concerns.  PAST MEDICAL AND SURGICAL HISTORY: Unremarkable.  PAST OB/GYN HISTORY: The patient has had all vaginal deliveries. She does have Natasha [name] history of Trichomonas and Chlamydia and abnormal Pap smears in the past but none recently.  MEDICATIONS: Mirena IUD.  ALLERGIES: No known drug allergies.  SOCIAL HISTORY: The patient denies any smoking, alcohol, or illicit drug use.  FAMILY HISTORY: Noncontributory.  PHYSICAL EXAMINATION: VITAL SIGNS: Temperature is 98.4, pulse 80, blood pressure 128/70, weight 136.7 pounds, height 60 inches.  GENERAL: No apparent distress.  ABDOMEN: Soft, nontender, and nondistended.  LUNGS: Clear to auscultation bilaterally.  HEART: Regular rate and rhythm.  EXTREMITIES: No cyanosis, clubbing, or edema.  PELVIC: Deferred.  ASSESSMENT/PLAN: The patient is Natasha [name] 25 year old gravida 3, para 1-2-0-3 with Natasha [name] misplaced IUD. The patient was counseled regarding needing laparoscopy and removal of the IUD. The risks of the surgery were explained in detail including but not limited to bleeding, infection,  injury to surrounding organs, need for additional procedures. All her questions were answered. The patient does say she is going to use condoms in the  meantime for contraception and is planning to use Implanon after surgery. Her surgery was booked on February 06, 2011 at 3:00 p.m. and routine preoperative instructions were given to the patient. She was told to call or come back in if she has any worsening pain or any other concerning symptoms.   Natasha Natasha [name]

## 2011-02-06 ENCOUNTER — Encounter (HOSPITAL_COMMUNITY): Payer: Self-pay | Admitting: Anesthesiology

## 2011-02-06 ENCOUNTER — Ambulatory Visit (HOSPITAL_COMMUNITY)
Admission: AD | Admit: 2011-02-06 | Discharge: 2011-02-06 | Disposition: A | Discharge status: Home / Self Care | Payer: Medicaid Other | Source: Ambulatory Visit | Attending: Obstetrics & Gynecology | Admitting: Obstetrics & Gynecology

## 2011-02-06 ENCOUNTER — Encounter (HOSPITAL_COMMUNITY)
Admission: AD | Disposition: A | Discharge status: Home / Self Care | Payer: Self-pay | Source: Ambulatory Visit | Attending: Obstetrics & Gynecology

## 2011-02-06 ENCOUNTER — Other Ambulatory Visit: Payer: Self-pay | Admitting: Obstetrics & Gynecology

## 2011-02-06 ENCOUNTER — Ambulatory Visit (HOSPITAL_COMMUNITY): Payer: Medicaid Other | Admitting: Anesthesiology

## 2011-02-06 DIAGNOSIS — T839XXA Unspecified complication of genitourinary prosthetic device, implant and graft, initial encounter: Secondary | ICD-10-CM

## 2011-02-06 DIAGNOSIS — T8339XA Other mechanical complication of intrauterine contraceptive device, initial encounter: Secondary | ICD-10-CM | POA: Insufficient documentation

## 2011-02-06 DIAGNOSIS — Z01812 Encounter for preprocedural laboratory examination: Secondary | ICD-10-CM | POA: Insufficient documentation

## 2011-02-06 DIAGNOSIS — IMO0002 Reserved for concepts with insufficient information to code with codable children: Secondary | ICD-10-CM

## 2011-02-06 HISTORY — PX: PROCTOSCOPY: SHX2266

## 2011-02-06 HISTORY — PX: LAPAROSCOPY: SHX197

## 2011-02-06 SURGERY — LAPAROSCOPY OPERATIVE
Anesthesia: General | Site: Rectum | Wound class: Clean Contaminated

## 2011-02-06 MED ORDER — LACTATED RINGERS IV SOLN
INTRAVENOUS | Status: DC
  Administered 2011-02-06: 14:00:00 via INTRAVENOUS

## 2011-02-06 MED ORDER — CEFAZOLIN SODIUM 1-5 GM-% IV SOLN
INTRAVENOUS | Status: AC
  Filled 2011-02-06: qty 50

## 2011-02-06 MED ORDER — LACTATED RINGERS IR SOLN
Status: DC | PRN
  Administered 2011-02-06: 3000 mL

## 2011-02-06 MED ORDER — PROPOFOL 10 MG/ML IV EMUL
INTRAVENOUS | Status: DC | PRN
  Administered 2011-02-06: 150 mg via INTRAVENOUS

## 2011-02-06 MED ORDER — LIDOCAINE HCL (CARDIAC) 20 MG/ML IV SOLN
INTRAVENOUS | Status: AC
  Filled 2011-02-06: qty 5

## 2011-02-06 MED ORDER — KETOROLAC TROMETHAMINE 30 MG/ML IJ SOLN
INTRAMUSCULAR | Status: AC
  Filled 2011-02-06: qty 1

## 2011-02-06 MED ORDER — ROCURONIUM BROMIDE 100 MG/10ML IV SOLN
INTRAVENOUS | Status: DC | PRN
  Administered 2011-02-06: 10 mg via INTRAVENOUS
  Administered 2011-02-06: 5 mg via INTRAVENOUS
  Administered 2011-02-06: 35 mg via INTRAVENOUS

## 2011-02-06 MED ORDER — IBUPROFEN 600 MG PO TABS: 600.0000 mg | ORAL_TABLET | Freq: Four times a day (QID) | ORAL | Status: AC | PRN

## 2011-02-06 MED ORDER — FENTANYL CITRATE 0.05 MG/ML IJ SOLN
INTRAMUSCULAR | Status: AC
  Filled 2011-02-06: qty 5

## 2011-02-06 MED ORDER — ONDANSETRON HCL 4 MG/2ML IJ SOLN
INTRAMUSCULAR | Status: DC | PRN
  Administered 2011-02-06: 4 mg via INTRAVENOUS

## 2011-02-06 MED ORDER — METRONIDAZOLE IN NACL 5-0.79 MG/ML-% IV SOLN
500.0000 mg | INTRAVENOUS | Status: DC
  Filled 2011-02-06: qty 100

## 2011-02-06 MED ORDER — NEOSTIGMINE METHYLSULFATE 1 MG/ML IJ SOLN
INTRAMUSCULAR | Status: DC | PRN
  Administered 2011-02-06: 3 mg via INTRAMUSCULAR

## 2011-02-06 MED ORDER — MIDAZOLAM HCL 2 MG/2ML IJ SOLN
INTRAMUSCULAR | Status: AC
  Filled 2011-02-06: qty 2

## 2011-02-06 MED ORDER — PROPOFOL 10 MG/ML IV EMUL
INTRAVENOUS | Status: AC
  Filled 2011-02-06: qty 20

## 2011-02-06 MED ORDER — OXYCODONE-ACETAMINOPHEN 5-325 MG PO TABS: 1.0000 | ORAL_TABLET | ORAL | Status: AC | PRN

## 2011-02-06 MED ORDER — BUPIVACAINE HCL (PF) 0.25 % IJ SOLN
INTRAMUSCULAR | Status: DC | PRN
  Administered 2011-02-06 (×2): 2 mL

## 2011-02-06 MED ORDER — ROCURONIUM BROMIDE 50 MG/5ML IV SOLN
INTRAVENOUS | Status: AC
  Filled 2011-02-06: qty 1

## 2011-02-06 MED ORDER — METRONIDAZOLE IN NACL 5-0.79 MG/ML-% IV SOLN
INTRAVENOUS | Status: DC | PRN
  Administered 2011-02-06: 500 mg via INTRAVENOUS

## 2011-02-06 MED ORDER — FENTANYL CITRATE 0.05 MG/ML IJ SOLN
INTRAMUSCULAR | Status: DC | PRN
  Administered 2011-02-06: 100 ug via INTRAVENOUS
  Administered 2011-02-06: 50 ug via INTRAVENOUS
  Administered 2011-02-06: 100 ug via INTRAVENOUS

## 2011-02-06 MED ORDER — GLYCOPYRROLATE 0.2 MG/ML IJ SOLN
INTRAMUSCULAR | Status: AC
  Filled 2011-02-06: qty 1

## 2011-02-06 MED ORDER — LACTATED RINGERS IV SOLN
INTRAVENOUS | Status: DC | PRN
  Administered 2011-02-06 (×2): via INTRAVENOUS

## 2011-02-06 MED ORDER — KETOROLAC TROMETHAMINE 30 MG/ML IJ SOLN
30.0000 mg | Freq: Once | INTRAMUSCULAR | Status: AC
  Administered 2011-02-06: 30 mg via INTRAVENOUS

## 2011-02-06 MED ORDER — DEXAMETHASONE SODIUM PHOSPHATE 10 MG/ML IJ SOLN
INTRAMUSCULAR | Status: DC | PRN
  Administered 2011-02-06: 10 mg via INTRAVENOUS

## 2011-02-06 MED ORDER — NEOSTIGMINE METHYLSULFATE 1 MG/ML IJ SOLN
INTRAMUSCULAR | Status: AC
  Filled 2011-02-06: qty 10

## 2011-02-06 MED ORDER — FENTANYL CITRATE 0.05 MG/ML IJ SOLN
INTRAMUSCULAR | Status: AC
  Filled 2011-02-06: qty 2

## 2011-02-06 MED ORDER — CEFAZOLIN SODIUM 1-5 GM-% IV SOLN
INTRAVENOUS | Status: DC | PRN
  Administered 2011-02-06: 1 g via INTRAVENOUS

## 2011-02-06 MED ORDER — LIDOCAINE HCL (CARDIAC) 20 MG/ML IV SOLN
INTRAVENOUS | Status: DC | PRN
  Administered 2011-02-06: 80 mg via INTRAVENOUS

## 2011-02-06 MED ORDER — GLYCOPYRROLATE 0.2 MG/ML IJ SOLN
INTRAMUSCULAR | Status: DC | PRN
  Administered 2011-02-06: .4 mg via INTRAVENOUS

## 2011-02-06 MED ORDER — ONDANSETRON HCL 4 MG/2ML IJ SOLN
INTRAMUSCULAR | Status: AC
  Filled 2011-02-06: qty 2

## 2011-02-06 MED ORDER — MIDAZOLAM HCL 5 MG/5ML IJ SOLN
INTRAMUSCULAR | Status: DC | PRN
  Administered 2011-02-06: 2 mg via INTRAVENOUS

## 2011-02-06 MED ORDER — FENTANYL CITRATE 0.05 MG/ML IJ SOLN
25.0000 ug | INTRAMUSCULAR | Status: DC | PRN
  Administered 2011-02-06: 50 ug via INTRAVENOUS
  Administered 2011-02-06: 25 ug via INTRAVENOUS

## 2011-02-06 SURGICAL SUPPLY — 29 items
CABLE HIGH FREQUENCY MONO STRZ (ELECTRODE) IMPLANT
CHLORAPREP W/TINT 26ML (MISCELLANEOUS) ×3 IMPLANT
CLOTH BEACON ORANGE TIMEOUT ST (SAFETY) ×3 IMPLANT
DRAPE UTILITY XL STRL (DRAPES) ×3 IMPLANT
DRSG COVADERM PLUS 2X2 (GAUZE/BANDAGES/DRESSINGS) ×3 IMPLANT
GLOVE BIO SURGEON STRL SZ7 (GLOVE) ×3 IMPLANT
GLOVE BIOGEL PI IND STRL 7.0 (GLOVE) ×4 IMPLANT
GLOVE BIOGEL PI INDICATOR 7.0 (GLOVE) ×2
GOWN BRE IMP SLV AUR LG STRL (GOWN DISPOSABLE) ×6 IMPLANT
NEEDLE GYRUS 33CM (NEEDLE) IMPLANT
NEEDLE INSUFFLATION 14GA 120MM (NEEDLE) ×3 IMPLANT
NS IRRIG 1000ML POUR BTL (IV SOLUTION) ×3 IMPLANT
PACK LAPAROSCOPY BASIN (CUSTOM PROCEDURE TRAY) ×3 IMPLANT
POUCH SPECIMEN RETRIEVAL 10MM (ENDOMECHANICALS) IMPLANT
SEALER TISSUE G2 CVD JAW 35 (ENDOMECHANICALS) IMPLANT
SEALER TISSUE G2 CVD JAW 45CM (ENDOMECHANICALS) IMPLANT
SET IRRIG TUBING LAPAROSCOPIC (IRRIGATION / IRRIGATOR) ×3 IMPLANT
SUT VIC AB 2-0 CT1 27 (SUTURE)
SUT VIC AB 2-0 CT1 TAPERPNT 27 (SUTURE) IMPLANT
SUT VIC AB 3-0 X1 27 (SUTURE) IMPLANT
SUT VICRYL 0 UR6 27IN ABS (SUTURE) ×3 IMPLANT
SUT VICRYL 4-0 PS2 18IN ABS (SUTURE) ×3 IMPLANT
TOWEL OR 17X24 6PK STRL BLUE (TOWEL DISPOSABLE) ×6 IMPLANT
TRAY FOLEY CATH 14FR (SET/KITS/TRAYS/PACK) ×3 IMPLANT
TROCAR Z-THREAD BLADED 11X100M (TROCAR) ×3 IMPLANT
TROCAR Z-THREAD BLADED 5X100MM (TROCAR) ×6 IMPLANT
TUBING NON-CON 1/4 X 20 CONN (TUBING) ×3 IMPLANT
WARMER LAPAROSCOPE (MISCELLANEOUS) ×3 IMPLANT
WATER STERILE IRR 1000ML POUR (IV SOLUTION) IMPLANT

## 2011-02-06 NOTE — Op Note (Signed)
Operative Laparoscopy Procedure Note  Indications: The patient is a 25 y.o. female gravida 3 para 1-2-0-3 who is here after recent diagnosis of a misplaced IUD. The patient had a Mirena that was placed on Dec 11, 2010, without any complication. However, when she came for her string check, the IUD strings were not able to be visualized, and she was sent for ultrasound. The ultrasound did not visualize the IUD and so she underwent more imaging modalities including a CT scan which apparently showed that the IUD was outside the uterus and overlying the sigmoid colon.  Pre-operative Diagnosis: Misplaced IUD  Post-operative Diagnosis: The same  Procedures:  Operative Laparoscopy, enterolysis, removal of misplaced IUD from the serosa of the sigmoid colon, proctoscopy  Surgeon: Jaynie Collins A   Assistants: Dr. Estelle Grumbles ([name]) - Intra-operative consultation  Anesthesia: [name] endotracheal anesthesia  ASA Class: 1  Findings:   Patient had normal pelvic anatomy.   The Mirena IUD was found to be involved in the serosa of sigmoid colon and was also entangled with other small bowel. It was not able to be removed easily, and there was great concern about possible bowel injury. Subsequently, an intraoperative consultation was done with the [name] team. Dr. Estelle Grumbles  was able to evaluate the bowel and remove the IUD in its entirety. Proctoscopy was done after the procedure which showed no apparent bowel injury. For details of this portion of the procedure, please refer to separate dictated operative report.   Estimated Blood Loss:  Minimal         Drains: None         Total IV Fluids:         Specimens: None    Procedure Details  The patient was seen in the Holding Room. The risks, benefits, complications, treatment options, and expected outcomes were discussed with the patient. The possibilities of reaction to medication, pulmonary aspiration, perforation of viscus,  bleeding, recurrent infection, the need for additional procedures, failure to diagnose a condition, and creating a complication requiring transfusion or operation were discussed with the patient. The patient concurred with the proposed plan, giving informed consent. The patient was taken to the Operating Room, identified as Natasha [name] and the procedure verified as operative laparoscopy. A Time Out was held and the above information confirmed.  After induction of [name] anesthesia, the patient was placed in modified dorsal lithotomy position where she was prepped, draped, and catheterized in the normal, sterile fashion.  The cervix was visualized and an intrauterine manipulator was placed. A 10 mm umbilical incision was then performed. Veress needle was passed and pneumoperitoneum was established. A 10 mm trocar was then placed through the umbilicus, and a laparoscope was placed .The above findings were noted.  Bilateral  5 mm lower quadrant ports were then placed. Using blunt graspers, the IUD strings were able to be grasped, but the body of the IUD was unable to be visualized. After careful manipulation, part of the IUD was noted to be involved in the serosa of the sigmoid colon and also entangled with small bowel. There was no evidence of any inflammation or purulent material. Given that the IUD could not be easily removed without more extensive manipulation of the bowel, the decision was made to consult [name] surgery. Dr. Estelle Grumbles was the [name] surgeon who assisted with the rest of the case. Please refer to his dictated report for details. He was able to manipulate the bowel and freed the IUD  from the serosa of the sigmoid. Proctoscopy was done which did not show any bowel injury. The peritoneal cavity was irrigated and hemostasis was confirmed. The abdomen was then desufflated, and all instruments were removed. The fascial incision of the umbilical port was closed using 0 Vicryl. All skin sutures  were then closed using subcuticular sutures of 4-0 Vicryl. The intrauterine manipulator was then removed.  Instrument, sponge, and needle counts were correct prior to abdominal closure and at the conclusion of the case.          Complications:  None; patient tolerated the procedure well.         Disposition: PACU - hemodynamically stable.         Condition: stable

## 2011-02-06 NOTE — Anesthesia Procedure Notes (Addendum)
Procedure Name: Intubation Performed by: Truitt Leep Pre-anesthesia Checklist: Patient identified, Emergency Drugs available, Patient being monitored and Timeout performed Patient Re-evaluated:Patient Re-evaluated prior to inductionOxygen Delivery Method: Circle System Utilized Preoxygenation: Pre-oxygenation with 100% oxygen Intubation Type: IV induction Ventilation: Mask ventilation without difficulty Laryngoscope Size: Mac and 3 Grade View: Grade II Tube type: Oral Tube size: 7.0 mm Number of attempts: 1 Airway Equipment and Method: patient positioned with wedge pillow Secured at: 20 cm

## 2011-02-06 NOTE — Transfer of Care (Signed)
Immediate Anesthesia Transfer of Care Note  Patient: Natasha [name]  Procedure(s) Performed:  LAPAROSCOPY OPERATIVE - Removal of Mirena IUD from sigmoid serosa, enterolysis, proctoscopy; PROCTOSCOPY  Patient Location: PACU  Anesthesia Type: General  Level of Consciousness: awake, alert  and oriented  Airway & Oxygen Therapy: Patient Spontanous Breathing  Post-op Assessment: Report given to PACU RN  Post vital signs: stable  Complications: No apparent anesthesia complications

## 2011-02-06 NOTE — OR Nursing (Signed)
Dr. Jasmine Awe returned call to OR. States she will send out all page to find a surgeon to come here.

## 2011-02-06 NOTE — OR Nursing (Signed)
General surgeon called to speak to Dr. Macon Large at [phone]

## 2011-02-06 NOTE — Anesthesia Preprocedure Evaluation (Addendum)
Anesthesia Evaluation  Name, MR# and DOB Patient awake  General Assessment Comment  Reviewed: Allergy & Precautions, H&P  and Patient's Chart, lab work & pertinent test results  Airway Mallampati: II TM Distance: >3 FB Neck ROM: full    Dental No notable dental hx (+) Teeth Intact   Pulmonaryneg pulmonary ROS    clear to auscultation  pulmonary exam normal   Cardiovascular regular Normal+ Systolic murmurs   Neuro/PsychNegative Neurological ROS   GI/Hepatic/Renal negative GI ROS, negative Liver ROS, and negative Renal ROS (+)       Endo/Other  Negative Endocrine ROS (+)   Abdominal   Musculoskeletal  Hematology negative hematology ROS (+)   Peds  Reproductive/Obstetrics negative OB ROS   Anesthesia Other Findings             Anesthesia Physical Anesthesia Plan  ASA: II  Anesthesia Plan: General   Post-op Pain Management:    Induction:   Airway Management Planned:   Additional Equipment:   Intra-op Plan:   Post-operative Plan:   Informed Consent: I have reviewed the patients History and Physical, chart, labs and discussed the procedure including the risks, benefits and alternatives for the proposed anesthesia with the patient or authorized representative who has indicated his/her understanding and acceptance.   Dental Advisory Given  Plan Discussed with: Anesthesiologist  Anesthesia Plan Comments:         Anesthesia Quick Evaluation

## 2011-02-07 NOTE — Anesthesia Postprocedure Evaluation (Signed)
  Anesthesia Post-op Note  Patient: Natasha [name]  Procedure(s) Performed:  LAPAROSCOPY OPERATIVE - Removal of Mirena IUD from sigmoid serosa, enterolysis, proctoscopy; PROCTOSCOPY  Patient Location: PACU  Anesthesia Type: General  Level of Consciousness: awake, alert  and oriented  Airway and Oxygen Therapy: Patient Spontanous Breathing  Post-op Pain: mild  Post-op Assessment: Post-op Vital signs reviewed, Patient's Cardiovascular Status Stable, Respiratory Function Stable, Patent Airway and No signs of Nausea or vomiting  Post-op Vital Signs: Reviewed and stable  Complications: No apparent anesthesia complications

## 2011-02-07 NOTE — Op Note (Unsigned)
NAME:  Natasha [name], Natasha [name]                  ACCOUNT NO.:  [id number]  MEDICAL RECORD NO.:  [id number]  LOCATION:  SDC                           FACILITY:  [facility]  PHYSICIAN:  Ardeth Sportsman, MD     DATE OF BIRTH:  02/28/1986  DATE OF PROCEDURE: DATE OF DISCHARGE:  02/04/2011                              OPERATIVE REPORT   Requesting physician is Horton Chin, MD  SURGEON:  Ardeth Sportsman, MD  PREOPERATIVE DIAGNOSIS:  Migrating intrauterine contraceptive device, intraperitoneal question of colonic breach.  POSTOPERATIVE DIAGNOSIS:  Wandering intrauterine contraceptive device intraperitoneal cavity, no evidence of breach of digestive tract.  PROCEDURE PERFORMED:  Diagnostic laparoscopy, laparoscopic lysis of adhesions x3 minutes, and rigid proctoscopy.  ANESTHESIA:  General.  SPECIMENS:  IUD.  DRAINS:  None.  ESTIMATED BLOOD LOSS:  Minimal.  COMPLICATIONS:  None apparent.  INDICATIONS:  Ms. Natasha [name] is a 25 year old female who had an IUD placed several years ago.  She had complaints of worsening abdominal pain. Workup showed evidence of IUD outside of the uterus.  Dr. Macon [name] recommended diagnostic laparoscopy for probable migration into the peritoneal cavity.  The patient was sent to the operating room.  Dr. Macon [name] noted intra-abdominally that she could find the tales of the IUD in the peritoneal cavity, but it was very difficulty to find the IUD itself.  There was some inflammation along the bowel.  She was concerned about possible breach or erosion into the sigmoid colon.  She therefore requested the intraoperative surgical consult and evaluation.  I was available when he came in.  OPERATIVE FINDINGS:  She had an intraperitoneal IUD.  She had redundant sigmoid colon.  The epiploic appendages and sigmoid mesentery had wrapped around the IUD.  There was no evidence of any abscess.  There was no evidence of any breach into the sigmoid colon or any part of the digestive tract.   Rigid proctoscopy had a negative air leak test.  DESCRIPTION OF PROCEDURE:  The patient was already asleep.  She has already received IV cefazolin.  I requested she have IV Flagyl as well. She already had a periumbilical 10-mm port and 5-mm left lower quadrant port.  I placed the 5-mm port in the right lower quadrant.  Of note, the assist was Dr. Jaynie Collins.  Dr. Macon [name] and I performed surgery together.  She could elevate the uterus.  I could see the tales down in the pelvis.  It seemed to be wrapped around epiploic appendages.  It was difficult to tell initially what was first, but I found the descending colon followed by sigmoid colon.  It was quite redundant.  I could find the cecum in the appendix, which was normal.  I could find the terminal ileum.  The cecum, appendix, and terminal ileum were up in the abdominal cavity, but not down in the pelvis.  Therefore, we were able to elevate the uterus and find the peritoneal flexion of rectum.  We followed that proximally and found the tales.  The epiploic appendages of the mid redundant sigmoid colon were wrapped around the IUD.  Began to do some lysis of adhesions between the sigmoid colon and the  rectum as it started to fold upon itself accordion style.  With that, I could open up and see the left side of the sigmoid mesentery and proximal rectus better.  With that we could see the IUD from the tale ends.  We further freed off and opened up and gently, sharply freed off the epiploic appendages and with some careful blunt and sharp debridement and found the IUD lying on the distal sigmoid mesentery distally on the left side. With that elevated, we were able to pull tales and it easily came up.  I did not see any evidence of any perforation.  There was no abscess or stool.  We did copious irrigation and reinspected.  I inspected the appendix, colon, and small intestine, and appeared to be normal.  I went down and did rigid  proctoscopy.  She did have a moderate amount of stool in the rectal vault, so visualization was difficult.  However, I saw an obvious purulence, I did not see any blood.  I was able to insufflate the rectosigmoid.  Dr. Macon [name] held the colon clamped in the descending colon.  The sigmoid mesentery and rectum insufflated quite well.  We did copious irrigation.  There was no leaking of air and no air leak bubbles.  No gas leaking out.  This implied no evidence of any peritoneal breach.  We did more irrigation and did reinspection.  There was minimal inflammation at the epiploic appendages and sigmoid mesentery where the IUD was.  Therefore, I felt no evidence of peritoneal breach, and therefore no need for any stitching.  No need for colostomy.  I left the case with Dr. Horton Chin who completed some irrigation.     Ardeth Sportsman, MD     SCG/MEDQ  D:  02/06/2011  T:  02/07/2011  Job:  [id number]

## 2011-02-12 ENCOUNTER — Encounter (HOSPITAL_COMMUNITY): Payer: Self-pay | Admitting: *Deleted

## 2011-02-12 ENCOUNTER — Inpatient Hospital Stay (HOSPITAL_COMMUNITY)
Admission: AD | Admit: 2011-02-12 | Discharge: 2011-02-12 | Disposition: A | Discharge status: Home / Self Care | Payer: Medicaid Other | Source: Ambulatory Visit | Attending: Obstetrics and Gynecology | Admitting: Obstetrics and Gynecology

## 2011-02-12 DIAGNOSIS — L739 Follicular disorder, unspecified: Secondary | ICD-10-CM

## 2011-02-12 DIAGNOSIS — R5082 Postprocedural fever: Secondary | ICD-10-CM | POA: Insufficient documentation

## 2011-02-12 DIAGNOSIS — L738 Other specified follicular disorders: Secondary | ICD-10-CM | POA: Insufficient documentation

## 2011-02-12 LAB — CBC
HCT: 37.1 % (ref 36.0–46.0)
Hemoglobin: 12 g/dL (ref 12.0–15.0)
MCHC: 32.3 g/dL (ref 30.0–36.0)
RBC: 3.99 MIL/uL (ref 3.87–5.11)

## 2011-02-12 LAB — DIFFERENTIAL
Basophils Relative: 0 % (ref 0–1)
Lymphocytes Relative: 29 % (ref 12–46)
Lymphs Abs: 2.8 10*3/uL (ref 0.7–4.0)
Monocytes Absolute: 0.8 10*3/uL (ref 0.1–1.0)
Monocytes Relative: 8 % (ref 3–12)
Neutro Abs: 5.9 10*3/uL (ref 1.7–7.7)

## 2011-02-12 LAB — URINALYSIS, ROUTINE W REFLEX MICROSCOPIC
Bilirubin Urine: NEGATIVE
Ketones, ur: NEGATIVE mg/dL
Nitrite: NEGATIVE
Urobilinogen, UA: 1 mg/dL (ref 0.0–1.0)
pH: 8 (ref 5.0–8.0)

## 2011-02-12 LAB — URINE MICROSCOPIC-ADD ON

## 2011-02-12 MED ORDER — SULFAMETHOXAZOLE-TRIMETHOPRIM 800-160 MG PO TABS: 1.0000 | ORAL_TABLET | Freq: Two times a day (BID) | ORAL | Status: AC

## 2011-02-12 MED ORDER — SULFAMETHOXAZOLE-TRIMETHOPRIM 800-160 MG PO TABS: 1.0000 | ORAL_TABLET | Freq: Two times a day (BID) | ORAL | Status: DC

## 2011-02-12 NOTE — Progress Notes (Signed)
Had surgery last week for IUD removal, fever of 100.1, now feels dizzy

## 2011-02-12 NOTE — ED Provider Notes (Addendum)
History     Chief Complaint  Patient presents with  . Fever   HPI Pt seen at [facility] department today with temp of 100.4. Was told to come in for eval. 1 week s/p laproscopic removal of intraperitoneal Mirena IUD. Surgery was performed by Dr. Macon Large who called in consult Dr. Michaell Cowing d/t difficulty with removal of IUD because of involvement of sigmoid colon. No breach of peritoneum or colon was found. Pt reports only mild "soreness". Has not had any pain meds or medicine for fever today.   The patient also c/o a "boil" under her left arm that has been there for a few days. Tender.     Past Medical History  Diagnosis Date  . No pertinent past medical history     Past Surgical History  Procedure Date  . No past surgeries   . Iud removal     No family history on file.  History  Substance Use Topics  . Smoking status: Never Smoker   . Smokeless tobacco: Not on file  . Alcohol Use: No    Allergies: No Known Allergies  Prescriptions prior to admission  Medication Sig Dispense Refill  . acetaminophen (TYLENOL) 500 MG tablet Take 1,000 mg by mouth daily as needed. For pain.       Marland Kitchen ibuprofen (ADVIL,MOTRIN) 600 MG tablet Take 1 tablet (600 mg total) by mouth every 6 (six) hours as needed for pain.  30 tablet  1  . oxyCODONE-acetaminophen (ROXICET) 5-325 MG per tablet Take 1 tablet by mouth every 4 (four) hours as needed for pain.  30 tablet  0    ROS Physical Exam   Blood pressure 124/58, pulse 83, temperature 98.8 F (37.1 C), temperature source Oral, resp. rate 16, height 5' 0.5" (1.537 m), weight 62.143 kg (137 lb), last menstrual period 01/24/2011.  Physical Exam  Constitutional: She is oriented to person, place, and time. She appears well-developed and well-nourished. No distress.  Cardiovascular: Normal rate.   Respiratory: Effort normal.  GI: Soft. She exhibits no distension and no mass. There is no tenderness. There is no guarding.  Neurological: She is alert and  oriented to person, place, and time.  Skin: Skin is warm, dry and intact.       Abd: three laproscopic incision sites well healed, no erythema or d/c Left axilla: 1 cm round indurated area, mild erythema, tender to palpation, several ingrown hairs noted in area  Psychiatric: She has a normal mood and affect.    MAU Course  Procedures   Assessment and Plan  25 yo s/p surgical IUD removal with h/o fever, stable status, afebrile and CBC WNL - infection precautions rev'd Folliculitis left axilla - rx Bactrim D/C home  FRAZIER,NATALIE 02/12/2011, 7:40 PM

## 2011-02-12 NOTE — ED Notes (Signed)
N. Bascom Levels CNM notified of pt.

## 2011-02-12 NOTE — Progress Notes (Signed)
Pt had mirena surgically removed 7/12, was seen today at [facility] for birth control.  Temp of 100.4.  Pt was instructed to be seen in MAU for fever.

## 2011-02-19 ENCOUNTER — Ambulatory Visit: Payer: Medicaid Other | Admitting: Obstetrics & Gynecology

## 2011-02-21 DIAGNOSIS — F53 Postpartum depression: Secondary | ICD-10-CM

## 2011-02-21 DIAGNOSIS — O99345 Other mental disorders complicating the puerperium: Secondary | ICD-10-CM

## 2011-02-21 DIAGNOSIS — O99891 Other specified diseases and conditions complicating pregnancy: Secondary | ICD-10-CM | POA: Insufficient documentation

## 2011-02-21 DIAGNOSIS — Z8659 Personal history of other mental and behavioral disorders: Secondary | ICD-10-CM

## 2011-02-21 HISTORY — DX: Other specified diseases and conditions complicating pregnancy: O99.891

## 2011-02-21 HISTORY — DX: Personal history of other mental and behavioral disorders: Z86.59

## 2011-02-21 HISTORY — DX: Postpartum depression: F53.0

## 2011-02-25 ENCOUNTER — Encounter (HOSPITAL_COMMUNITY): Payer: Self-pay | Admitting: Obstetrics & Gynecology

## 2011-02-28 ENCOUNTER — Ambulatory Visit (INDEPENDENT_AMBULATORY_CARE_PROVIDER_SITE_OTHER): Payer: Medicaid Other | Admitting: Obstetrics & Gynecology

## 2011-02-28 ENCOUNTER — Encounter: Payer: Self-pay | Admitting: Obstetrics & Gynecology

## 2011-02-28 VITALS — BP 128/77 | HR 77 | Temp 97.7°F | Ht 60.0 in | Wt 137.7 lb

## 2011-02-28 DIAGNOSIS — Z9889 Other specified postprocedural states: Secondary | ICD-10-CM

## 2011-02-28 NOTE — Progress Notes (Signed)
Subjective:     Oney Tatlock Chim is a 25 y.o. female who presents to the clinic 3 weeks status post laparoscopy and IUD removal for misplaced IUD. Eating a regular diet without difficulty. Bowel movements are normal. The patient is not having any pain.  The following portions of the patient's history were reviewed and updated as appropriate: allergies, current medications, past family history, past medical history, past social history, past surgical history and problem list.  Review of Systems Pertinent items are noted in HPI.    Objective:    BP 128/77  Pulse 77  Temp(Src) 97.7 F (36.5 C) (Oral)  Ht 5' (1.524 m)  Wt 137 lb 11.2 oz (62.46 kg)  BMI 26.89 kg/m2  LMP 01/24/2011 General:  alert, cooperative and no distress  Abdomen: soft, bowel sounds active, non-tender  Incision:   healing well, no drainage, no erythema, no hernia, no seroma, no swelling, no dehiscence, incision well approximated     Assessment:    Doing well postoperatively. Operative findings again reviewed. Pathology report discussed.    Plan:    1. Continue any current medications. 2. Wound care discussed. 3. Activity restrictions: none 4. Anticipated return to work: now. 5. Follow up: As needed.

## 2011-04-17 LAB — POCT URINALYSIS DIP (DEVICE)
Bilirubin Urine: NEGATIVE
Glucose, UA: NEGATIVE
Hgb urine dipstick: NEGATIVE
Ketones, ur: NEGATIVE
Specific Gravity, Urine: 1.02
pH: 7.5

## 2011-04-18 LAB — POCT URINALYSIS DIP (DEVICE)
Bilirubin Urine: NEGATIVE
Glucose, UA: NEGATIVE
Hgb urine dipstick: NEGATIVE
Ketones, ur: NEGATIVE
Specific Gravity, Urine: 1.02
pH: 7.5

## 2011-04-21 LAB — POCT URINALYSIS DIP (DEVICE)
Bilirubin Urine: NEGATIVE
Bilirubin Urine: NEGATIVE
Glucose, UA: NEGATIVE
Glucose, UA: NEGATIVE
Hgb urine dipstick: NEGATIVE
Hgb urine dipstick: NEGATIVE
Ketones, ur: NEGATIVE
Ketones, ur: NEGATIVE
Nitrite: NEGATIVE
Nitrite: NEGATIVE
pH: 7
pH: 6

## 2011-04-22 LAB — POCT URINALYSIS DIP (DEVICE)
Bilirubin Urine: NEGATIVE
Bilirubin Urine: NEGATIVE
Bilirubin Urine: NEGATIVE
Glucose, UA: NEGATIVE
Glucose, UA: NEGATIVE
Glucose, UA: NEGATIVE
Hgb urine dipstick: NEGATIVE
Ketones, ur: NEGATIVE
Ketones, ur: NEGATIVE
Ketones, ur: NEGATIVE
Nitrite: NEGATIVE
Nitrite: NEGATIVE
Nitrite: NEGATIVE
Operator id: 200901
pH: 7

## 2011-04-23 LAB — POCT URINALYSIS DIP (DEVICE)
Bilirubin Urine: NEGATIVE
Bilirubin Urine: NEGATIVE
Glucose, UA: NEGATIVE
Glucose, UA: NEGATIVE
Ketones, ur: NEGATIVE
Nitrite: NEGATIVE
Nitrite: NEGATIVE
Operator id: 297281
Operator id: 200901
pH: 7
pH: 7

## 2011-04-24 LAB — CBC
MCHC: 33.6
RDW: 13.9

## 2011-04-24 LAB — POCT URINALYSIS DIP (DEVICE)
Bilirubin Urine: NEGATIVE
Glucose, UA: NEGATIVE
Glucose, UA: NEGATIVE
Hgb urine dipstick: NEGATIVE
Nitrite: NEGATIVE
Nitrite: NEGATIVE
Urobilinogen, UA: 1
Urobilinogen, UA: 0.2
pH: 6.5
pH: 7

## 2011-04-24 LAB — RPR: RPR Ser Ql: NONREACTIVE

## 2011-12-19 ENCOUNTER — Emergency Department (HOSPITAL_COMMUNITY)
Admission: EM | Admit: 2011-12-19 | Discharge: 2011-12-20 | Disposition: A | Discharge status: Home / Self Care | Payer: Medicaid Other | Attending: Emergency Medicine | Admitting: Emergency Medicine

## 2011-12-19 ENCOUNTER — Encounter (HOSPITAL_COMMUNITY): Payer: Self-pay | Admitting: *Deleted

## 2011-12-19 DIAGNOSIS — S0181XA Laceration without foreign body of other part of head, initial encounter: Secondary | ICD-10-CM

## 2011-12-19 DIAGNOSIS — S0180XA Unspecified open wound of other part of head, initial encounter: Secondary | ICD-10-CM | POA: Insufficient documentation
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
## 2011-12-20 NOTE — ED Provider Notes (Signed)
Medical screening examination/treatment/procedure(s) were performed by non-physician practitioner and as supervising physician I was immediately available for consultation/collaboration.   Coltrane Tugwell, MD 12/20/11 0846 

## 2011-12-20 NOTE — Discharge Instructions (Signed)
Facial Laceration A facial laceration is a cut on the face. It can take 1 to 2 years for the scar to heal completely. HOME CARE  For stitches (sutures):  Keep the cut clean and dry.   If you have a bandage (dressing), change it at least once a day. Change the bandage if it gets wet or dirty, or as told by your doctor.   Wash the cut with soap and water 2 times a day. Rinse the cut with water. Pat it dry with a clean towel.   Put a thin layer of medicated cream on the cut as told by your doctor.   You may shower after the first 24 hours. Do not soak the cut in water until the stitches are removed.   Only take medicines as told by your doctor.   Have your stitches removed as told by your doctor.   Do not wear makeup until a few days after your stitches are removed.  For skin adhesive strips:  Keep the cut clean and dry.   Do not get the strips wet. You may take a bath, but be careful to keep the cut dry.   If the cut gets wet, pat it dry with a clean towel.   The strips will fall off on their own. Do not remove the strips that are still stuck to the cut.  For wound glue:  You may shower or take baths. Do not soak or scrub the cut. Do not swim. Avoid heavy sweating until the glue falls off on its own. After a shower or bath, pat the cut dry with a clean towel.   Do not put medicine or makeup on your cut until the glue falls off.   If you have a bandage, do not put tape over the glue.   Avoid lots of sunlight or tanning lamps until the glue falls off. Put sunscreen on the cut for the first year to reduce your scar.   The glue will fall off on its own. Do not pick at the glue.  You may need a tetanus shot if:  You cannot remember when you had your last tetanus shot.   You have never had a tetanus shot.  If you need a tetanus shot and you choose not to have one, you may get tetanus. Sickness from tetanus can be serious. GET HELP RIGHT AWAY IF:   Your cut gets red, painful,  or puffy (swollen).   There is yellowish-white fluid (pus) coming from the cut.   You have chills or a fever.  MAKE SURE YOU:   Understand these instructions.   Will watch your condition.   Will get help right away if you are not doing well or get worse.  Document Released: 12/31/2007 Document Revised: 07/03/2011 Document Reviewed: 01/07/2011 [employer] Patient Information 2012 [employer].Stitches, Staples, or Skin Adhesive Strips  Stitches (sutures), staples, and skin adhesive strips hold the skin together as it heals. They will usually be in place for 7 days or less. HOME CARE  Wash your hands with soap and water before and after you touch your wound.   Only take medicine as told by your doctor.   Cover your wound only if your doctor told you to. Otherwise, leave it open to air.   Do not get your stitches wet or dirty. If they get dirty, dab them gently with a clean washcloth. Wet the washcloth with soapy water. Do not rub. Pat them dry gently.     Do not put medicine or medicated cream on your stitches unless your doctor told you to.   Do not take out your own stitches or staples. Skin adhesive strips will fall off by themselves.   Do not pick at the wound. Picking can cause an infection.   Do not miss your follow-up appointment.   If you have problems or questions, call your doctor.  GET HELP RIGHT AWAY IF:   You have a temperature by mouth above 102 F (38.9 C), not controlled by medicine.   You have chills.   You have redness or pain around your stitches.   There is puffiness (swelling) around your stitches.   You notice fluid (drainage) from your stitches.   There is a bad smell coming from your wound.  MAKE SURE YOU:  Understand these instructions.   Will watch your condition.   Will get help if you are not doing well or get worse.  Document Released: 05/11/2009 Document Revised: 07/03/2011 Document Reviewed: 05/11/2009 [employer] Patient Information  2012 [employer]. 
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
## 2012-11-09 IMAGING — US US EXAM
1 series · 12 of 16 positions shown · non-contrast
Comparison: none

[Series 1: us ob follow up + us ob transvaginal · 12 of 68 slices shown]
[im 1/68]
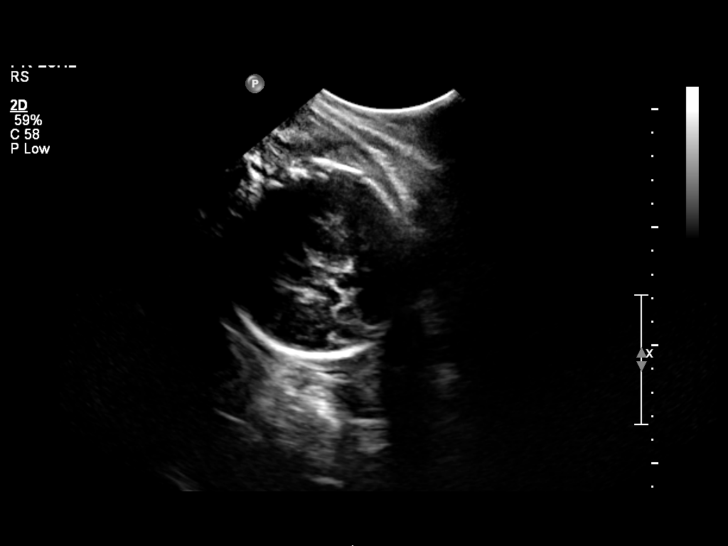
[im 9/68]
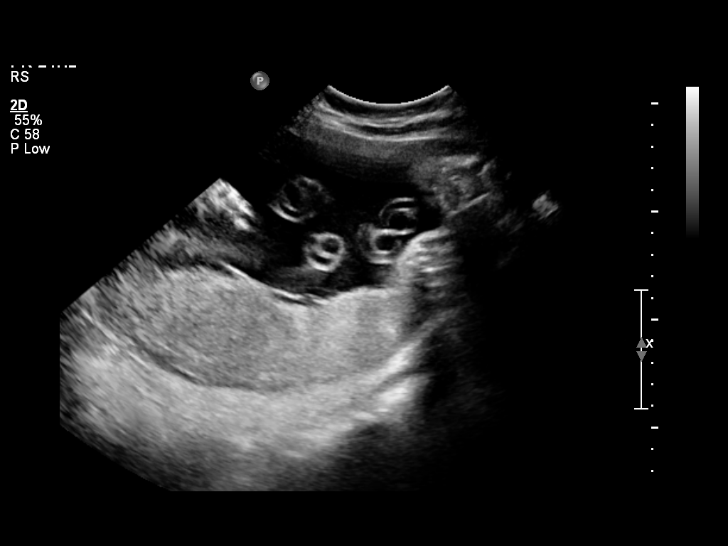
[im 14/68]
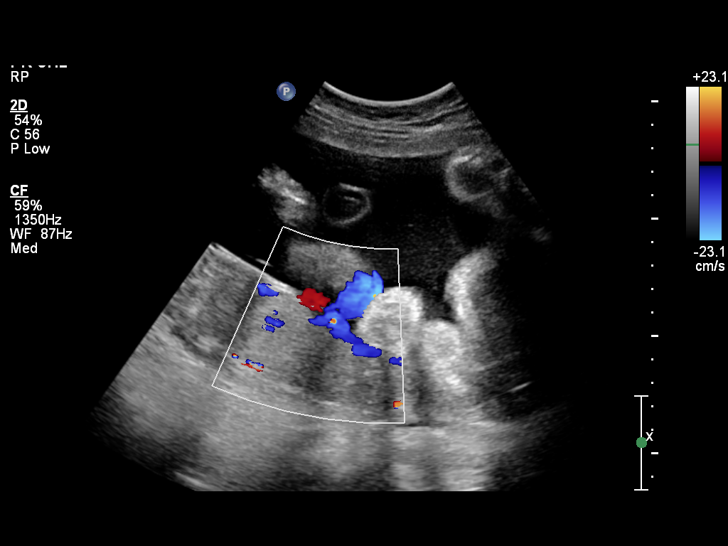
[im 18/68]
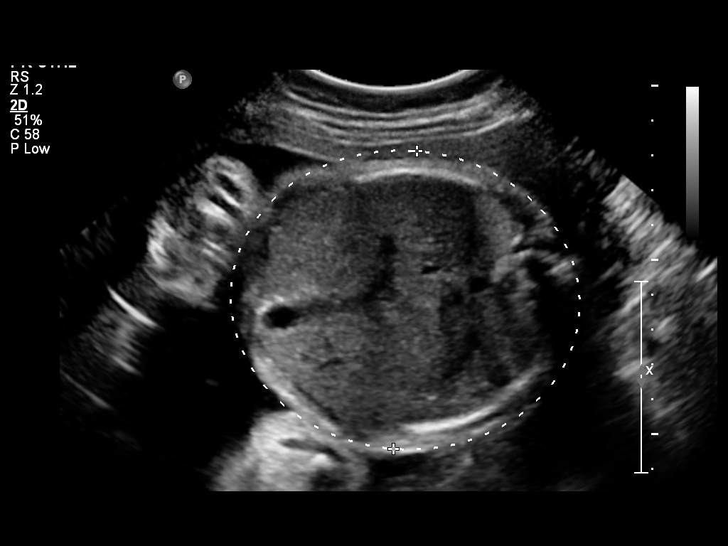
[im 27/68]
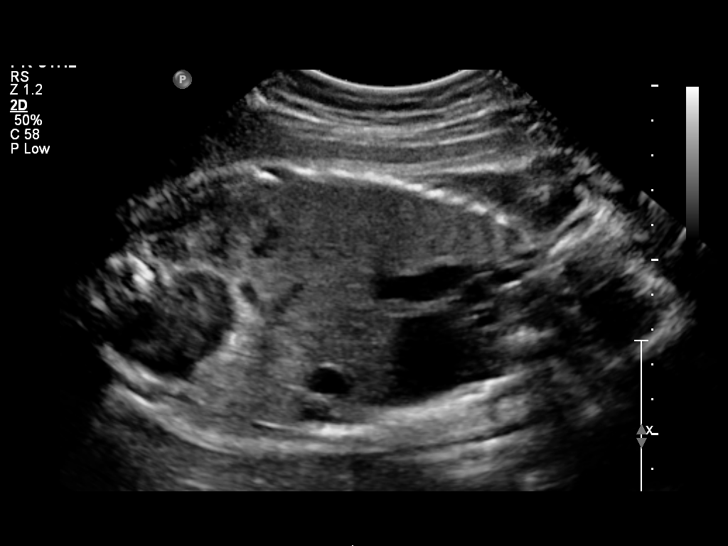
[im 32/68]
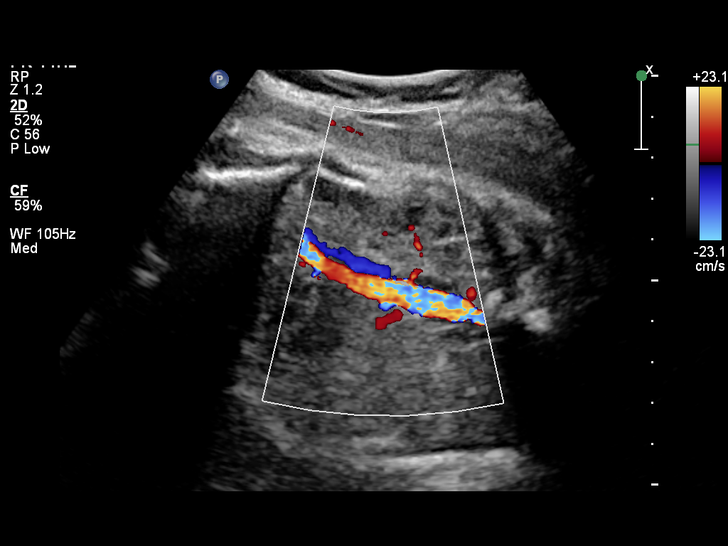
[im 36/68]
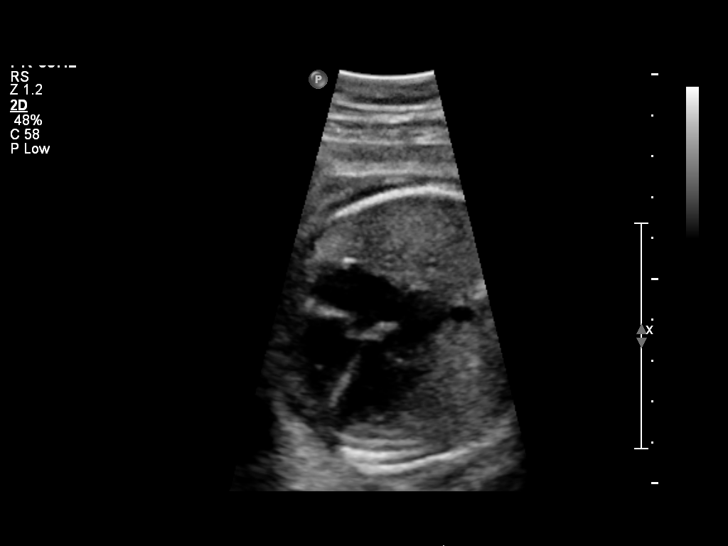
[im 45/68]
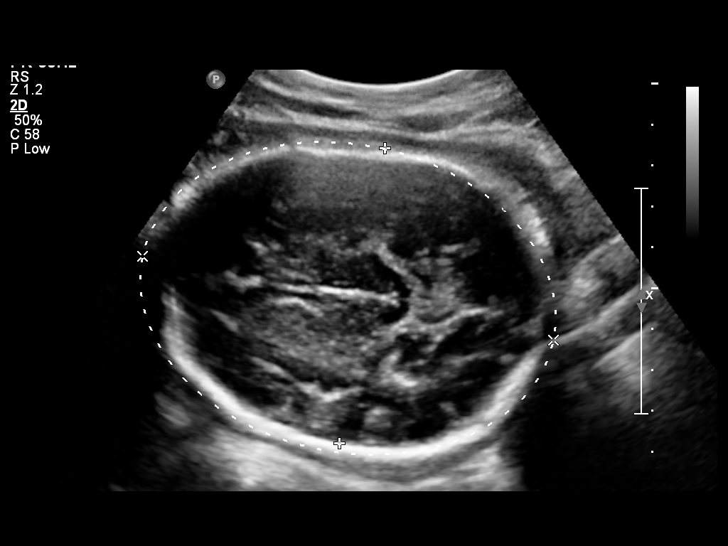
[im 50/68]
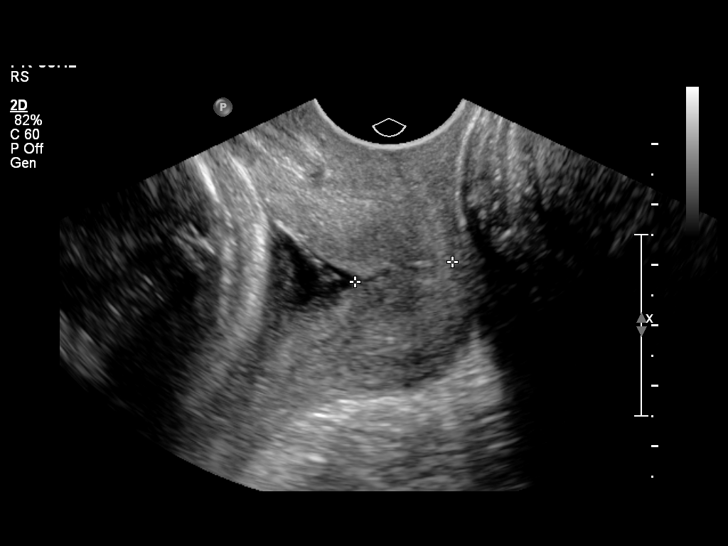
[im 54/68]
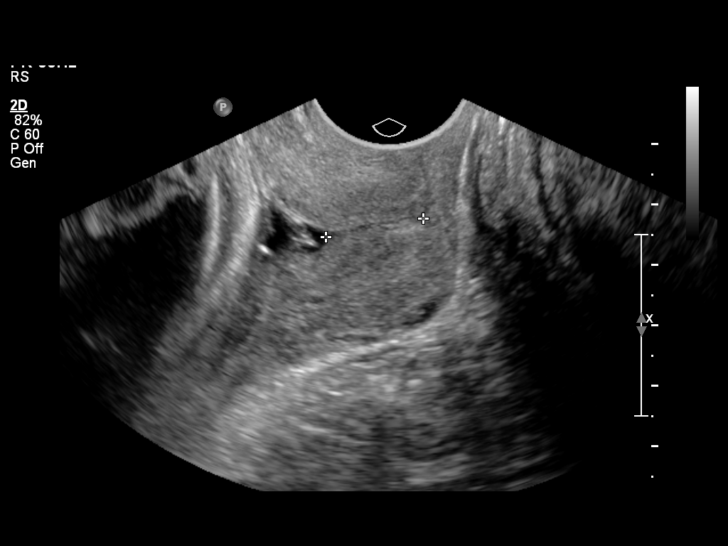
[im 63/68]
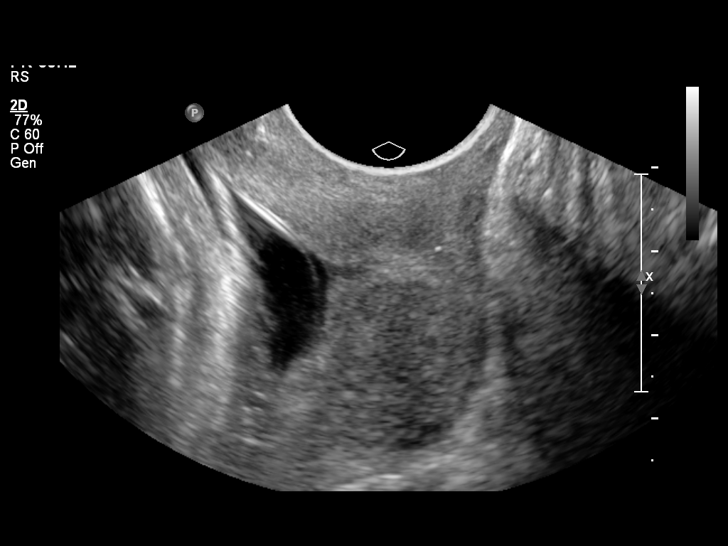
[im 68/68]
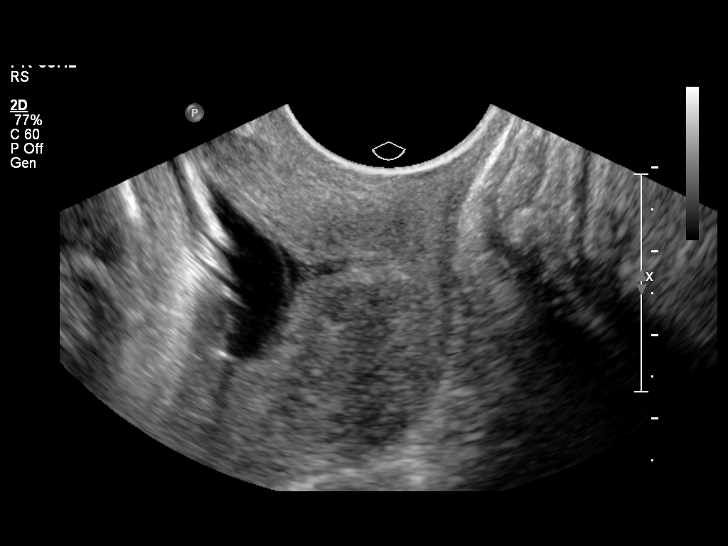

[12 of 16 positions shown; findings below may reference images not displayed]

OBSTETRICS REPORT
                      (Signed Final 09/30/2010 [DATE])

 Order#:         [id number]
Procedures

 US OB FOLLOW UP + US OB TRANSVAGINAL                  76816.4
Indications

 Poor obstetric history: Previous preterm delivery
 Assess Fetal Growth / Estimated Fetal Weight
 Assess cervical length
 Hypertension - Gestational
Fetal Evaluation

 Fetal Heart Rate:  150                          bpm
 Cardiac Activity:  Observed
 Presentation:      Cephalic
 Placenta:          Posterior, above cervical
                    os
 P. Cord            Previously Visualized
 Insertion:

 Amniotic Fluid
 AFI FV:      Subjectively within normal limits
 AFI Sum:     15.49   cm       55  %Tile     Larg Pckt:     4.5  cm
 RUQ:   4.5     cm   RLQ:    2.58   cm    LUQ:   4.07    cm   LLQ:    4.34   cm
Biometry

 BPD:     74.1  mm     G. Age:  29w 5d                CI:        70.61   70 - 86
                                                      FL/HC:      22.6   19.1 -

 HC:     281.1  mm     G. Age:  30w 6d      < 3  %    HC/AC:      1.00   0.96 -

 AC:     279.9  mm     G. Age:  32w 0d       42  %    FL/BPD:     85.8   71 - 87
 FL:      63.6  mm     G. Age:  32w 6d       54  %    FL/AC:      22.7   20 - 24

 Est. FW:    6888  gm      4 lb 2 oz     50  %
Gestational Age

 LMP:           32w 2d        Date:  02/16/10                 EDD:   11/23/10
 U/S Today:     31w 3d                                        EDD:   11/29/10
 Best:          32w 2d     Det. By:  LMP  (02/16/10)          EDD:   11/23/10
Anatomy
 Cranium:           Previously seen     Aortic Arch:       Previously seen
 Fetal Cavum:       Previously seen     Ductal Arch:       Previously seen
 Ventricles:        Appears normal      Diaphragm:         Appears normal
 Choroid Plexus:    Previously seen     Stomach:           Appears normal
 Cerebellum:        Previously seen     Abdomen:           Previously seen
 Posterior Fossa:   Previously seen     Abdominal Wall:    Previously seen
 Nuchal Fold:       Previously seen     Cord Vessels:      Previously seen
 Face:              Previously seen     Kidneys:           Appear normal
 Heart:             Appears normal      Bladder:           Appears normal
                    (4 chamber &
                    axis)
 RVOT:              Previously seen     Spine:             Previously seen
 LVOT:              Previously seen     Limbs:             Previously seen

 Other:     Female gender. Heels and 5th digit previously seen.
Cervix Uterus Adnexa

 Cervical Length:    1.4      cm

 Cervix:       Measured transvaginally.

 Left Ovary:    Not visualized. No adnexal mass visualized.
 Right Ovary:   Not visualized. No adnexal mass visualized.
Impression

 Assigned GA is currently 32w 2d.   Appropriate fetal growth,
 with EFW at 50 %ile.
 Amniotic fluid within normal limits, with AFI of 15.49 cm.
 Shortened cervix measuring 1.4cm in length, with funneling of
 internal os.

 questions or concerns.

## 2012-12-29 ENCOUNTER — Telehealth: Payer: Self-pay | Admitting: *Deleted

## 2012-12-29 NOTE — Telephone Encounter (Signed)
Patient called to ask the name of procedure that was done to remove her mirena. I told her that it was called a laparoscopy. Pt had no further questions.

## 2013-06-03 ENCOUNTER — Emergency Department (HOSPITAL_COMMUNITY)
Admission: EM | Admit: 2013-06-03 | Discharge: 2013-06-03 | Disposition: A | Discharge status: Home / Self Care | Payer: Self-pay | Source: Home / Self Care | Attending: Emergency Medicine | Admitting: Emergency Medicine

## 2013-06-03 ENCOUNTER — Encounter (HOSPITAL_COMMUNITY): Payer: Self-pay | Admitting: Emergency Medicine

## 2013-06-03 DIAGNOSIS — L732 Hidradenitis suppurativa: Secondary | ICD-10-CM

## 2013-06-03 MED ORDER — SULFAMETHOXAZOLE-TMP DS 800-160 MG PO TABS: 2.0000 | ORAL_TABLET | Freq: Two times a day (BID) | ORAL | Status: DC

## 2013-06-03 MED ORDER — PREDNISONE 20 MG PO TABS: 20.0000 mg | ORAL_TABLET | Freq: Two times a day (BID) | ORAL | Status: DC

## 2013-06-03 MED ORDER — CLINDAMYCIN PHOSPHATE 1 % EX SOLN: Freq: Two times a day (BID) | CUTANEOUS | Status: DC

## 2013-06-03 NOTE — ED Notes (Signed)
Abscess under right arm, history of the same.  Onset one week ago.

## 2013-06-03 NOTE — ED Provider Notes (Signed)
Chief Complaint:   Chief Complaint  Patient presents with  . Abscess    History of Present Illness:    Natasha [name] is a 27 year old female who has had a one-week history of a boil in her right axilla. She's had boils in both axillas off-and-on for years. Her last abscess was about 2 months ago. She's not had any abscess in her groin. She's had these incised and drained. She cannot recall ever hearing about the culture results. She's never been told that she has hidradenitis suppurativa.  Review of Systems:  Other than noted above, the patient denies any of the following symptoms: Systemic:  No fever, chills or sweats. Skin:  No rash or itching.  PMFSH:  Past medical history, family history, social history, meds, and allergies were reviewed.  No history of diabetes or prior history of MRSA.   Physical Exam:   Vital signs:  BP 124/72  Pulse 93  Temp(Src) 99.4 F (37.4 C) (Oral)  Resp 17  SpO2 96% Skin:  She has scarring in both axillas from previous abscesses. In the right axilla there is a 1.5 x 3 cm oval, fluctuant mass which was tender to palpation.  Skin exam was otherwise normal.  No rash. Ext:  Distal pulses were full, patient has full ROM of all joints.  Procedure:  Verbal informed consent was obtained.  The patient was informed of the risks and benefits of the procedure and understands and accepts.  Identity of the patient was verified verbally and by wristband.   The abscess area described above was prepped with Betadine and alcohol and anesthetized with 3 mL of 2% Xylocaine with epinephrine.  Using a #11 scalpel blade, a singe straight incision was made into the area of fluctulence, yielding a large amount of malodorous prurulent drainage.  Routine cultures were obtained.  Blunt dissection was used to break up loculations and the resulting wound cavity was packed with 1/4 inch Iodoform gauze.  A sterile pressure dressing was applied.  Assessment:  The encounter diagnosis was  Hidradenitis suppurativa.  Suggested followup with a dermatologist. Return here in 48 hours for removal of packing. She can start using Cleocin T for prevention after this has cleared up.  Plan:   1.  Meds:  The following meds were prescribed:   Discharge Medication List as of 06/03/2013  1:47 PM    START taking these medications   Details  clindamycin (CLEOCIN-T) 1 % external solution Apply topically 2 (two) times daily., Starting 06/03/2013, Until Discontinued, Normal    predniSONE (DELTASONE) 20 MG tablet Take 1 tablet (20 mg total) by mouth 2 (two) times daily., Starting 06/03/2013, Until Discontinued, Normal    sulfamethoxazole-trimethoprim (BACTRIM DS) 800-160 MG per tablet Take 2 tablets by mouth 2 (two) times daily., Starting 06/03/2013, Until Discontinued, Normal        2.  Patient Education/Counseling:  The patient was given appropriate handouts, self care instructions, and instructed in symptomatic relief.    3.  Follow up:  The patient was instructed to leave the dressing in place and return again in 48 hours for packing removal, if becoming worse in any way, and given some red flag symptoms such as fever which would prompt immediate return.  Follow up with a dermatologist once this has healed up for management of her hidradenitis suppurativa. I told her that this would be a chronic condition, and she would need to have long-term followup. She can use the Cleocin T topically for prevention.  Reuben Likes, MD 06/03/13 [phone]

## 2013-06-05 ENCOUNTER — Emergency Department (INDEPENDENT_AMBULATORY_CARE_PROVIDER_SITE_OTHER)
Admission: EM | Admit: 2013-06-05 | Discharge: 2013-06-05 | Disposition: A | Discharge status: Home / Self Care | Payer: Self-pay | Source: Home / Self Care | Attending: Family Medicine | Admitting: Family Medicine

## 2013-06-05 ENCOUNTER — Encounter (HOSPITAL_COMMUNITY): Payer: Self-pay | Admitting: Emergency Medicine

## 2013-06-05 DIAGNOSIS — L732 Hidradenitis suppurativa: Secondary | ICD-10-CM

## 2013-06-05 DIAGNOSIS — Z4800 Encounter for change or removal of nonsurgical wound dressing: Secondary | ICD-10-CM

## 2013-06-05 NOTE — ED Provider Notes (Signed)
CSN: [id number]     Arrival date & time 06/05/13  1335 History   First MD Initiated Contact with Patient 06/05/13 1403     Chief Complaint  Patient presents with  . Recurrent Skin Infections   (Consider location/radiation/quality/duration/timing/severity/associated sxs/prior Treatment) HPI Comments: 27 year old female presents for followup. She had an abscess of the right axilla incised and drained 2 days ago. She was prescribed prednisone, Bactrim, and clindamycin solution but she has not filled any of these prescriptions. She says the pain is significantly better but she is still having drainage. She says the packing is still in place. She denies any fever, chills, NVD, or any other systemic symptoms.   Past Medical History  Diagnosis Date  . No pertinent past medical history    Past Surgical History  Procedure Laterality Date  . No past surgeries    . Iud removal    . Laparoscopy  02/06/2011    Procedure: LAPAROSCOPY OPERATIVE;  Surgeon: Tereso Newcomer, MD;  Location: [facility];  Service: Gynecology;  Laterality: N/A;  Removal of Mirena IUD from sigmoid serosa, enterolysis, proctoscopy  . Proctoscopy  02/06/2011    Procedure: PROCTOSCOPY;  Surgeon: Ardeth Sportsman, MD;  Location: [facility];  Service: General;  Laterality: N/A;   Family History  Problem Relation Age of Onset  . Hypertension Maternal Grandmother   . Hypertension Maternal Grandfather   . Hypertension Paternal Grandmother   . Hypertension Paternal Grandfather    History  Substance Use Topics  . Smoking status: Never Smoker   . Smokeless tobacco: Never Used  . Alcohol Use: No   OB History   Grav Para Term Preterm Abortions TAB SAB Ect Mult Living   3 3 1 2      3      Review of Systems  Constitutional: Negative for fever and chills.  Eyes: Negative for visual disturbance.  Respiratory: Negative for cough and shortness of breath.   Cardiovascular: Negative for chest pain, palpitations and leg swelling.   Gastrointestinal: Negative for nausea, vomiting and abdominal pain.  Endocrine: Negative for polydipsia and polyuria.  Genitourinary: Negative for dysuria, urgency and frequency.  Musculoskeletal: Negative for arthralgias and myalgias.  Skin: Positive for wound. Negative for rash.  Neurological: Negative for dizziness, weakness and light-headedness.    Allergies  Review of patient's allergies indicates no known allergies.  Home Medications   Current Outpatient Rx  Name  Route  Sig  Dispense  Refill  . acetaminophen (TYLENOL) 500 MG tablet   Oral   Take 1,000 mg by mouth daily as needed. For pain.          . clindamycin (CLEOCIN-T) 1 % external solution   Topical   Apply topically 2 (two) times daily.   60 mL   99   . predniSONE (DELTASONE) 20 MG tablet   Oral   Take 1 tablet (20 mg total) by mouth 2 (two) times daily.   10 tablet   0   . sulfamethoxazole-trimethoprim (BACTRIM DS) 800-160 MG per tablet   Oral   Take 2 tablets by mouth 2 (two) times daily.   40 tablet   0    BP 116/78  Pulse 83  Temp(Src) 99.3 F (37.4 C) (Oral)  Resp 16  SpO2 100% Physical Exam  Nursing note and vitals reviewed. Constitutional: She is oriented to person, place, and time. Vital signs are normal. She appears well-developed and well-nourished. No distress.  HENT:  Head: Normocephalic and atraumatic.  Pulmonary/Chest: Effort  normal. No respiratory distress.  Neurological: She is alert and oriented to person, place, and time. She has normal strength. Coordination normal.  Skin: Skin is warm and dry. She is not diaphoretic.  Abscess under right axilla, packing in place.  Minimal surrounding induration. Site is still tender to palpation.  Psychiatric: She has a normal mood and affect. Judgment normal.    ED Course  Procedures (including critical care time) Labs Review Labs Reviewed - No data to display Imaging Review No results found.    MDM   1. Hydradenitis     Packing removed there is still a significant abscess cavity. Repacked with quarter-inch iodoform gauze. Followup in 3 days for removal and reassessment.    Graylon Good, PA-C 06/05/13 1505

## 2013-06-05 NOTE — ED Provider Notes (Signed)
Medical screening examination/treatment/procedure(s) were performed by resident physician or non-physician practitioner and as supervising physician I was immediately available for consultation/collaboration.   KINDL,JAMES DOUGLAS MD.   James D Kindl, MD 06/05/13 1959 

## 2013-06-05 NOTE — ED Notes (Signed)
FOLLOW UP ON BOIL UNDER RIGHT ARM PIT PATIENT HAS NOT BEEN TAKING ANTIBIOTICS

## 2013-06-06 ENCOUNTER — Encounter (HOSPITAL_COMMUNITY): Payer: Self-pay | Admitting: Emergency Medicine

## 2013-06-06 ENCOUNTER — Emergency Department (INDEPENDENT_AMBULATORY_CARE_PROVIDER_SITE_OTHER)
Admission: EM | Admit: 2013-06-06 | Discharge: 2013-06-06 | Disposition: A | Discharge status: Home / Self Care | Payer: Self-pay | Source: Home / Self Care

## 2013-06-06 DIAGNOSIS — L02411 Cutaneous abscess of right axilla: Secondary | ICD-10-CM

## 2013-06-06 DIAGNOSIS — Z4800 Encounter for change or removal of nonsurgical wound dressing: Secondary | ICD-10-CM

## 2013-06-06 DIAGNOSIS — IMO0002 Reserved for concepts with insufficient information to code with codable children: Secondary | ICD-10-CM

## 2013-06-06 NOTE — ED Provider Notes (Signed)
CSN: [id number]     Arrival date & time 06/06/13  1517 History   None    Chief Complaint  Patient presents with  . Follow-up   (Consider location/radiation/quality/duration/timing/severity/associated sxs/prior Treatment) Patient is a 27 y.o. female presenting with wound check.  Wound Check This is a new problem. Episode onset: seen 11/9 for recheck, wound was repacked, today packing came out, pt uncertain.  The problem has been rapidly improving.    Past Medical History  Diagnosis Date  . No pertinent past medical history    Past Surgical History  Procedure Laterality Date  . No past surgeries    . Iud removal    . Laparoscopy  02/06/2011    Procedure: LAPAROSCOPY OPERATIVE;  Surgeon: Tereso Newcomer, MD;  Location: [facility];  Service: Gynecology;  Laterality: N/A;  Removal of Mirena IUD from sigmoid serosa, enterolysis, proctoscopy  . Proctoscopy  02/06/2011    Procedure: PROCTOSCOPY;  Surgeon: Ardeth Sportsman, MD;  Location: [facility];  Service: General;  Laterality: N/A;   Family History  Problem Relation Age of Onset  . Hypertension Maternal Grandmother   . Hypertension Maternal Grandfather   . Hypertension Paternal Grandmother   . Hypertension Paternal Grandfather    History  Substance Use Topics  . Smoking status: Never Smoker   . Smokeless tobacco: Never Used  . Alcohol Use: No   OB History   Grav Para Term Preterm Abortions TAB SAB Ect Mult Living   3 3 1 2      3      Review of Systems  Constitutional: Negative.   Skin: Positive for wound.    Allergies  Review of patient's allergies indicates no known allergies.  Home Medications   Current Outpatient Rx  Name  Route  Sig  Dispense  Refill  . acetaminophen (TYLENOL) 500 MG tablet   Oral   Take 1,000 mg by mouth daily as needed. For pain.          . clindamycin (CLEOCIN-T) 1 % external solution   Topical   Apply topically 2 (two) times daily.   60 mL   99   . predniSONE (DELTASONE) 20 MG tablet  Oral   Take 1 tablet (20 mg total) by mouth 2 (two) times daily.   10 tablet   0   . sulfamethoxazole-trimethoprim (BACTRIM DS) 800-160 MG per tablet   Oral   Take 2 tablets by mouth 2 (two) times daily.   40 tablet   0    BP 117/60  Pulse 76  Temp(Src) 98.2 F (36.8 C) (Oral)  Resp 16  SpO2 100% Physical Exam  Nursing note and vitals reviewed. Constitutional: She is oriented to person, place, and time. She appears well-developed and well-nourished.  Neurological: She is alert and oriented to person, place, and time.  Skin: Skin is warm and dry.  Right axillary abscess healed, no drainage, no erythema, nontender.    ED Course  Procedures (including critical care time) Labs Review Labs Reviewed - No data to display Imaging Review No results found.  EKG Interpretation     Ventricular Rate:    PR Interval:    QRS Duration:   QT Interval:    QTC Calculation:   R Axis:     Text Interpretation:              MDM      Linna Hoff, MD 06/06/13 [phone]

## 2013-06-06 NOTE — ED Notes (Signed)
Packing fell out while bathing this AM; placed yesterday

## 2013-06-07 LAB — CULTURE, ROUTINE-ABSCESS

## 2013-06-07 NOTE — ED Notes (Signed)
Abscess culture R axilla:  Few Proteus Mirabilis. Pt. adequately treated with Bactrim DS and Cleocin external solution.   Vassie Moselle 06/07/2013

## 2013-07-02 ENCOUNTER — Encounter (HOSPITAL_COMMUNITY): Payer: Self-pay | Admitting: Emergency Medicine

## 2013-07-02 ENCOUNTER — Emergency Department (HOSPITAL_COMMUNITY): Payer: Self-pay

## 2013-07-02 ENCOUNTER — Emergency Department (HOSPITAL_COMMUNITY)
Admission: EM | Admit: 2013-07-02 | Discharge: 2013-07-02 | Disposition: A | Discharge status: Home / Self Care | Payer: Self-pay | Attending: Emergency Medicine | Admitting: Emergency Medicine

## 2013-07-02 DIAGNOSIS — S61219A Laceration without foreign body of unspecified finger without damage to nail, initial encounter: Secondary | ICD-10-CM

## 2013-07-02 DIAGNOSIS — Y929 Unspecified place or not applicable: Secondary | ICD-10-CM | POA: Insufficient documentation

## 2013-07-02 DIAGNOSIS — W230XXA Caught, crushed, jammed, or pinched between moving objects, initial encounter: Secondary | ICD-10-CM | POA: Insufficient documentation

## 2013-07-02 DIAGNOSIS — Z23 Encounter for immunization: Secondary | ICD-10-CM | POA: Insufficient documentation

## 2013-07-02 DIAGNOSIS — Y9389 Activity, other specified: Secondary | ICD-10-CM | POA: Insufficient documentation

## 2013-07-02 DIAGNOSIS — S61209A Unspecified open wound of unspecified finger without damage to nail, initial encounter: Secondary | ICD-10-CM | POA: Insufficient documentation

## 2013-07-02 MED ORDER — CEPHALEXIN 500 MG PO CAPS: 500.0000 mg | ORAL_CAPSULE | Freq: Two times a day (BID) | ORAL | Status: DC

## 2013-07-02 MED ORDER — IBUPROFEN 800 MG PO TABS: 800.0000 mg | ORAL_TABLET | Freq: Three times a day (TID) | ORAL | Status: DC | PRN

## 2013-07-02 MED ORDER — TETANUS-DIPHTH-ACELL PERTUSSIS 5-2.5-18.5 LF-MCG/0.5 IM SUSP
0.5000 mL | Freq: Once | INTRAMUSCULAR | Status: AC
  Administered 2013-07-02: 0.5 mL via INTRAMUSCULAR
  Filled 2013-07-02: qty 0.5

## 2013-07-02 NOTE — ED Provider Notes (Signed)
CSN: [id number]     Arrival date & time 07/02/13  1628 History  This chart was scribed for non-physician practitioner Trixie Dredge, PA-C working with Gerhard Munch, MD by Valera Castle, ED scribe. This patient was seen in room TR09C/TR09C and the patient's care was started at 4:40 PM.   Chief Complaint  Patient presents with  . Finger Injury    The history is provided by the patient. No language interpreter was used.   HPI Comments: Natasha [name] is a 27 y.o. female who presents to the Emergency Department complaining of small laceration to her right 4th finger, with associated sudden, mild, constant pain and tingling, onset immediately PTA when she smashed her finger in a car door while on her way to work. Pt reports being right hand dominant, and is able to move her right hand and fingers. She denies having taken any medication PTA. She denies knowing if her tetanus is UTD. She denies numbness in her right 4th finger, any other injuries, and any other associated symptoms. She denies any allergies to medication. She denies h/o bleeding problems, clots.  She requests a note for work today.   PCP - No PCP Per Patient  Past Medical History  Diagnosis Date  . No pertinent past medical history    Past Surgical History  Procedure Laterality Date  . No past surgeries    . Iud removal    . Laparoscopy  02/06/2011    Procedure: LAPAROSCOPY OPERATIVE;  Surgeon: Tereso Newcomer, MD;  Location: [facility];  Service: Gynecology;  Laterality: N/A;  Removal of Mirena IUD from sigmoid serosa, enterolysis, proctoscopy  . Proctoscopy  02/06/2011    Procedure: PROCTOSCOPY;  Surgeon: Ardeth Sportsman, MD;  Location: [facility];  Service: General;  Laterality: N/A;   Family History  Problem Relation Age of Onset  . Hypertension Maternal Grandmother   . Hypertension Maternal Grandfather   . Hypertension Paternal Grandmother   . Hypertension Paternal Grandfather    History  Substance Use Topics  . Smoking  status: Never Smoker   . Smokeless tobacco: Never Used  . Alcohol Use: No   OB History   Grav Para Term Preterm Abortions TAB SAB Ect Mult Living   3 3 1 2      3      Review of Systems  Skin: Positive for wound (laceration to right ring finger). Negative for color change.  Allergic/Immunologic: Negative for immunocompromised state.  Neurological: Negative for weakness and numbness.       Positive for tingling in her right ring finger.  Hematological: Does not bruise/bleed easily.    Allergies  Review of patient's allergies indicates no known allergies.  Home Medications   No current outpatient prescriptions on file.  Triage Vitals: BP 114/71  Pulse 90  Temp(Src) 98 F (36.7 C) (Oral)  Resp 16  Ht 5' (1.524 m)  Wt 135 lb 8 oz (61.462 kg)  BMI 26.46 kg/m2  SpO2 99%  Physical Exam  Nursing note and vitals reviewed. Constitutional: She is oriented to person, place, and time. She appears well-developed and well-nourished. No distress.  HENT:  Head: Normocephalic and atraumatic.  Eyes: EOM are normal.  Neck: Neck supple.  Cardiovascular: Normal rate.   Cap refill > 2 seconds.   Pulmonary/Chest: Effort normal.  Musculoskeletal:  Right 4th finger with small vertical laceration over DIP of palmar aspect. Pt has full active ROM of finger.   Neurological: She is alert and oriented to person, place,  and time.  Distal sensation intact.   Skin: She is not diaphoretic.  Psychiatric: She has a normal mood and affect. Her behavior is normal.    ED Course  Procedures (including critical care time)  DIAGNOSTIC STUDIES: Oxygen Saturation is 99% on room air, normal by my interpretation.    COORDINATION OF CARE: 4:43 PM-Discussed treatment plan which includes right ring finger X-ray with pt at bedside and pt agreed to plan.   5:57 PM - Discussed with pt imaging results were negative for fracture.   LACERATION REPAIR PROCEDURE NOTE The patient's identification was confirmed  and consent was obtained. This procedure was performed by Trixie Dredge, PA-C at 5:59 PM. Site: Right 4th finger Sterile procedures observed: Saline Anesthetic used (type and amt): Digital Block, 2 % Lidocaine w/o Epinephrine Suture type/size: 6.0 Vicryl  Length: 1.2 cm # of Sutures: 3 Technique: Uninterrupted Complexity: Simple Tetanus ordered  6:15 PM - Discussed with pt the sutures are dissolvable and to keep area clean and covered, especially at work. Advised pt she may desire to wear a glove. Advised pt to f/u if she notices infection, worsening symptoms.  Labs Review Labs Reviewed - No data to display Imaging Review Dg Finger Ring Right  07/02/2013   CLINICAL DATA:  Injury to the right ring finger.  EXAM: RIGHT RING FINGER 2+V  COMPARISON:  None.  FINDINGS: Negative for an acute fracture or dislocation. Mild hyperextension at the DIP joint which may be normal for this patient. Otherwise, alignment of the ring finger is normal.  IMPRESSION: No acute bone abnormality.   Electronically Signed   By: Richarda Overlie M.D.   On: 07/02/2013 17:44    EKG Interpretation   None       MDM   1. Finger laceration, initial encounter    Pt with injury to right 4th finger after accidentally closing finger in car door.  Very superficial laceration.  Neurovascularly intact.  Lac repaired in ED.  Discussed result, findings, treatment, and follow up  with patient.  Pt given return precautions.  Pt verbalizes understanding and agrees with plan.        I personally performed the services described in this documentation, which was scribed in my presence. The recorded information has been reviewed and is accurate.    Trixie Dredge, PA-C 07/02/13 1922

## 2013-07-02 NOTE — ED Notes (Signed)
Pt states she smashed finger in the car door. No bleeding noted at triage. Capillary refill noted. No numbness upon palpation.

## 2013-07-02 NOTE — ED Notes (Signed)
PA at bedside.

## 2013-07-03 NOTE — ED Provider Notes (Signed)
  Medical screening examination/treatment/procedure(s) were performed by non-physician practitioner and as supervising physician I was immediately available for consultation/collaboration.  EKG Interpretation   None          Tayna Smethurst, MD 07/03/13 0033 

## 2013-07-18 ENCOUNTER — Encounter (HOSPITAL_COMMUNITY): Payer: Self-pay | Admitting: Emergency Medicine

## 2013-07-18 ENCOUNTER — Emergency Department (HOSPITAL_COMMUNITY): Payer: Self-pay

## 2013-07-18 ENCOUNTER — Emergency Department (HOSPITAL_COMMUNITY)
Admission: EM | Admit: 2013-07-18 | Discharge: 2013-07-18 | Disposition: A | Discharge status: Home / Self Care | Payer: Self-pay | Attending: Emergency Medicine | Admitting: Emergency Medicine

## 2013-07-18 DIAGNOSIS — S0510XA Contusion of eyeball and orbital tissues, unspecified eye, initial encounter: Secondary | ICD-10-CM | POA: Insufficient documentation

## 2013-07-18 DIAGNOSIS — S0512XA Contusion of eyeball and orbital tissues, left eye, initial encounter: Secondary | ICD-10-CM

## 2013-07-18 MED ORDER — SODIUM CHLORIDE 0.9 % IV BOLUS (SEPSIS): 1000.0000 mL | Freq: Once | INTRAVENOUS | Status: DC

## 2013-07-18 MED ORDER — HYDROCODONE-ACETAMINOPHEN 5-325 MG PO TABS: 1.0000 | ORAL_TABLET | ORAL | Status: DC | PRN

## 2013-07-18 NOTE — ED Provider Notes (Signed)
Medical screening examination/treatment/procedure(s) were performed by non-physician practitioner and as supervising physician I was immediately available for consultation/collaboration.     Olivia Mackie, MD 07/18/13 [phone]
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
## 2014-05-29 ENCOUNTER — Encounter (HOSPITAL_COMMUNITY): Payer: Self-pay | Admitting: Emergency Medicine

## 2015-08-12 ENCOUNTER — Encounter (HOSPITAL_COMMUNITY): Payer: Self-pay

## 2015-08-12 DIAGNOSIS — N390 Urinary tract infection, site not specified: Secondary | ICD-10-CM | POA: Insufficient documentation

## 2015-08-12 DIAGNOSIS — Z3202 Encounter for pregnancy test, result negative: Secondary | ICD-10-CM | POA: Insufficient documentation

## 2015-08-12 LAB — LIPASE, BLOOD: LIPASE: 34 U/L (ref 11–51)

## 2015-08-12 LAB — URINE MICROSCOPIC-ADD ON

## 2015-08-12 LAB — COMPREHENSIVE METABOLIC PANEL
ALK PHOS: 60 U/L (ref 38–126)
ALT: 28 U/L (ref 14–54)
AST: 25 U/L (ref 15–41)
Albumin: 4 g/dL (ref 3.5–5.0)
Anion gap: 10 (ref 5–15)
BILIRUBIN TOTAL: 0.3 mg/dL (ref 0.3–1.2)
BUN: 13 mg/dL (ref 6–20)
CALCIUM: 9.6 mg/dL (ref 8.9–10.3)
CHLORIDE: 104 mmol/L (ref 101–111)
CO2: 26 mmol/L (ref 22–32)
CREATININE: 0.91 mg/dL (ref 0.44–1.00)
Glucose, Bld: 114 mg/dL — ABNORMAL HIGH (ref 65–99)
Potassium: 4.1 mmol/L (ref 3.5–5.1)
Sodium: 140 mmol/L (ref 135–145)
Total Protein: 7.2 g/dL (ref 6.5–8.1)

## 2015-08-12 LAB — CBC
HCT: 38.4 % (ref 36.0–46.0)
Hemoglobin: 12.6 g/dL (ref 12.0–15.0)
MCH: 30.4 pg (ref 26.0–34.0)
MCHC: 32.8 g/dL (ref 30.0–36.0)
MCV: 92.8 fL (ref 78.0–100.0)
PLATELETS: 267 10*3/uL (ref 150–400)
RBC: 4.14 MIL/uL (ref 3.87–5.11)
RDW: 13.3 % (ref 11.5–15.5)
WBC: 8.7 10*3/uL (ref 4.0–10.5)

## 2015-08-12 LAB — URINALYSIS, ROUTINE W REFLEX MICROSCOPIC
Bilirubin Urine: NEGATIVE
GLUCOSE, UA: NEGATIVE mg/dL
Ketones, ur: NEGATIVE mg/dL
LEUKOCYTES UA: NEGATIVE
Nitrite: NEGATIVE
PROTEIN: NEGATIVE mg/dL
Specific Gravity, Urine: 1.022 (ref 1.005–1.030)
pH: 8 (ref 5.0–8.0)

## 2015-08-12 LAB — POC URINE PREG, ED: PREG TEST UR: NEGATIVE

## 2015-08-12 NOTE — ED Notes (Signed)
Pt started having lower abd pain three days ago with nausea. Not vomiting or having any diarrhea. No vaginal bleeding or discharge.

## 2015-08-13 ENCOUNTER — Emergency Department (HOSPITAL_COMMUNITY)
Admission: EM | Admit: 2015-08-13 | Discharge: 2015-08-13 | Disposition: A | Discharge status: Home / Self Care | Payer: Medicaid Other | Attending: Emergency Medicine | Admitting: Emergency Medicine

## 2015-08-13 DIAGNOSIS — N390 Urinary tract infection, site not specified: Secondary | ICD-10-CM

## 2015-08-13 MED ORDER — NITROFURANTOIN MONOHYD MACRO 100 MG PO CAPS: 100.0000 mg | ORAL_CAPSULE | Freq: Two times a day (BID) | ORAL | Status: DC

## 2015-08-13 NOTE — ED Notes (Signed)
Pt reports possible UTI, states urinary frequency. Eating granola bar in room.

## 2015-08-13 NOTE — ED Provider Notes (Signed)
CSN: [id number]     Arrival date & time 08/12/15  2215 History   First MD Initiated Contact with Patient 08/13/15 [phone]     Chief Complaint  Patient presents with  . Abdominal Pain     (Consider location/radiation/quality/duration/timing/severity/associated sxs/prior Treatment) HPI Comments: 30 year old female who presents with lower abdominal pain. Patient reports 3 days of intermittent, mild lower abdominal pain associated with mild nausea. No vomiting or diarrhea. No vaginal bleeding or discharge. She denies any burning with urination but does endorse increased urinary frequency. She has had a UTI in the distant past and states that these symptoms are similar. No fevers. Able to eat and drink normally.  Patient is a 30 y.o. female presenting with abdominal pain. The history is provided by the patient.  Abdominal Pain   Past Medical History  Diagnosis Date  . No pertinent past medical history    Past Surgical History  Procedure Laterality Date  . No past surgeries    . Iud removal    . Laparoscopy  02/06/2011    Procedure: LAPAROSCOPY OPERATIVE;  Surgeon: Osborne Oman, MD;  Location: [facility];  Service: Gynecology;  Laterality: N/A;  Removal of Mirena IUD from sigmoid serosa, enterolysis, proctoscopy  . Proctoscopy  02/06/2011    Procedure: PROCTOSCOPY;  Surgeon: Adin Hector, MD;  Location: [facility];  Service: General;  Laterality: N/A;   Family History  Problem Relation Age of Onset  . Hypertension Maternal Grandmother   . Hypertension Maternal Grandfather   . Hypertension Paternal Grandmother   . Hypertension Paternal Grandfather    Social History  Substance Use Topics  . Smoking status: Never Smoker   . Smokeless tobacco: Never Used  . Alcohol Use: No   OB History    Gravida Para Term Preterm AB TAB SAB Ectopic Multiple Living   3 3 1 2      3      Review of Systems  Gastrointestinal: Positive for abdominal pain.   10 Systems reviewed and are negative for acute  change except as noted in the HPI.    Allergies  Review of patient's allergies indicates no known allergies.  Home Medications   Prior to Admission medications   Medication Sig Start Date End Date Taking? Authorizing Provider  HYDROcodone-acetaminophen (NORCO/VICODIN) 5-325 MG per tablet Take 1 tablet by mouth every 4 (four) hours as needed. 07/18/13   Marissa Sciacca, PA-C  nitrofurantoin, macrocrystal-monohydrate, (MACROBID) 100 MG capsule Take 1 capsule (100 mg total) by mouth 2 (two) times daily. 08/13/15   Wenda Overland Jasmane Brockway, MD   BP 108/66 mmHg  Pulse 85  Temp(Src) 98.6 F (37 C) (Oral)  Resp 16  SpO2 99% Physical Exam  Constitutional: She is oriented to person, place, and time. She appears well-developed and well-nourished. No distress.  HENT:  Head: Normocephalic and atraumatic.  Moist mucous membranes  Eyes: Conjunctivae are normal. Pupils are equal, round, and reactive to light.  Neck: Neck supple.  Cardiovascular: Normal rate, regular rhythm and normal heart sounds.   No murmur heard. Pulmonary/Chest: Effort normal and breath sounds normal.  Abdominal: Soft. Bowel sounds are normal. She exhibits no distension. There is no tenderness.  Musculoskeletal: She exhibits no edema.  Neurological: She is alert and oriented to person, place, and time.  Fluent speech  Skin: Skin is warm and dry.  Psychiatric: She has a normal mood and affect. Judgment normal.  Pleasant  Nursing note and vitals reviewed.   ED Course  Procedures (including critical  care time) Labs Review Labs Reviewed  COMPREHENSIVE METABOLIC PANEL - Abnormal; Notable for the following:    Glucose, Bld 114 (*)    All other components within normal limits  URINALYSIS, ROUTINE W REFLEX MICROSCOPIC (NOT AT [facility]) - Abnormal; Notable for the following:    APPearance CLOUDY (*)    Hgb urine dipstick TRACE (*)    All other components within normal limits  URINE MICROSCOPIC-ADD ON - Abnormal; Notable for the  following:    Squamous Epithelial / LPF 6-30 (*)    Bacteria, UA RARE (*)    All other components within normal limits  LIPASE, BLOOD  CBC  POC URINE PREG, ED    Imaging Review No results found. I have personally reviewed and evaluated these lab results as part of my medical decision-making.   EKG Interpretation None      MDM   Final diagnoses:  UTI (lower urinary tract infection)    Pt w/ 3 d intermittent lower abd pain w/ urinary frequency, no change in appetite or bowel movements. She was well-appearing with normal vital signs. No abdominal tenderness on exam. Labs show negative UPT, UA with small amount of blood and rare bacteria. Given that patient's symptoms are similar to previous UTI, discussed treatment with Macrobid. Given that she has normal appetite and is otherwise well-appearing, my suspicion for life-threatening processes such as appendicitis is low. I did discuss return precautions and patient voiced understanding. Patient discharged in satisfactory condition.  Sharlett Iles, MD 08/13/15 [phone]

## 2015-10-26 LAB — HM PAP SMEAR: HM Pap smear: NEGATIVE

## 2015-12-31 ENCOUNTER — Ambulatory Visit (HOSPITAL_COMMUNITY)
Admission: EM | Admit: 2015-12-31 | Discharge: 2015-12-31 | Disposition: A | Discharge status: Home / Self Care | Payer: Medicaid Other | Attending: Family Medicine | Admitting: Family Medicine

## 2015-12-31 ENCOUNTER — Encounter (HOSPITAL_COMMUNITY): Payer: Self-pay | Admitting: Family Medicine

## 2015-12-31 DIAGNOSIS — L0291 Cutaneous abscess, unspecified: Secondary | ICD-10-CM | POA: Diagnosis not present

## 2015-12-31 MED ORDER — CLINDAMYCIN HCL 150 MG PO CAPS: 150.0000 mg | ORAL_CAPSULE | Freq: Four times a day (QID) | ORAL | Status: DC

## 2015-12-31 NOTE — Discharge Instructions (Signed)
Abscess °An abscess (boil or furuncle) is an infected area on or under the skin. This area is filled with yellowish-white fluid (pus) and other material (debris). °HOME CARE  °· Only take medicines as told by your doctor. °· If you were given antibiotic medicine, take it as directed. Finish the medicine even if you start to feel better. °· If gauze is used, follow your doctor's directions for changing the gauze. °· To avoid spreading the infection: °¨ Keep your abscess covered with a bandage. °¨ Wash your hands well. °¨ Do not share personal care items, towels, or whirlpools with others. °¨ Avoid skin contact with others. °· Keep your skin and clothes clean around the abscess. °· Keep all doctor visits as told. °GET HELP RIGHT AWAY IF:  °· You have more pain, puffiness (swelling), or redness in the wound site. °· You have more fluid or blood coming from the wound site. °· You have muscle aches, chills, or you feel sick. °· You have a fever. °MAKE SURE YOU:  °· Understand these instructions. °· Will watch your condition. °· Will get help right away if you are not doing well or get worse. °  °This information is not intended to replace advice given to you by your health care provider. Make sure you discuss any questions you have with your health care provider. °  °Document Released: 12/31/2007 Document Revised: 01/13/2012 Document Reviewed: 09/27/2011 °Elsevier Interactive Patient Education ©2016 [employer] ° °
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
## 2015-12-31 NOTE — ED Provider Notes (Signed)
CSN: [id number]     Arrival date & time 12/31/15  1724 History   First MD Initiated Contact with Patient 12/31/15 [place]     Chief Complaint  Patient presents with  . Abscess   (Consider location/radiation/quality/duration/timing/severity/associated sxs/prior Treatment) HPI Comments:       Patient is a 30 y.o. female presenting with abscess. The history is provided by the patient. No language interpreter was used.  Abscess Location:  Shoulder/arm Shoulder/arm abscess location:  L axilla Size:  2 Abscess quality: induration, painful and warmth   Red streaking: no   Duration:  2 days Progression:  Worsening Pain details:    Quality:  Dull, pressure and tightness   Duration:  2 days   Timing:  Constant   Progression:  Worsening   Past Medical History  Diagnosis Date  . No pertinent past medical history    Past Surgical History  Procedure Laterality Date  . No past surgeries    . Iud removal    . Laparoscopy  02/06/2011    Procedure: LAPAROSCOPY OPERATIVE;  Surgeon: Osborne Oman, MD;  Location: [facility];  Service: Gynecology;  Laterality: N/A;  Removal of Mirena IUD from sigmoid serosa, enterolysis, proctoscopy  . Proctoscopy  02/06/2011    Procedure: PROCTOSCOPY;  Surgeon: Adin Hector, MD;  Location: [facility];  Service: General;  Laterality: N/A;   Family History  Problem Relation Age of Onset  . Hypertension Maternal Grandmother   . Hypertension Maternal Grandfather   . Hypertension Paternal Grandmother   . Hypertension Paternal Grandfather    Social History  Substance Use Topics  . Smoking status: Never Smoker   . Smokeless tobacco: Never Used  . Alcohol Use: No   OB History    Gravida Para Term Preterm AB TAB SAB Ectopic Multiple Living   3 3 1 2      3      Review of Systems  Constitutional: Negative for activity change.  HENT: Negative.   Eyes: Negative.   Respiratory: Negative.   Cardiovascular: Negative.   Genitourinary: Negative.   Skin:        Abscess    Allergies  Review of patient's allergies indicates no known allergies.  Home Medications   Prior to Admission medications   Medication Sig Start Date End Date Taking? Authorizing Provider  HYDROcodone-acetaminophen (NORCO/VICODIN) 5-325 MG per tablet Take 1 tablet by mouth every 4 (four) hours as needed. 07/18/13   Marissa Sciacca, PA-C  nitrofurantoin, macrocrystal-monohydrate, (MACROBID) 100 MG capsule Take 1 capsule (100 mg total) by mouth 2 (two) times daily. 08/13/15   Sharlett Iles, MD   Meds Ordered and Administered this Visit  Medications - No data to display  BP 115/78 mmHg  Pulse 86  Temp(Src) 97.4 F (36.3 C) (Oral)  Resp 12  SpO2 96% No data found.   Physical Exam  Constitutional: She is oriented to person, place, and time. She appears well-developed and well-nourished.  HENT:  Head: Normocephalic and atraumatic.  Pulmonary/Chest: Effort normal and breath sounds normal.  Neurological: She is alert and oriented to person, place, and time.  Skin: Skin is warm and dry.  Left axilla, swollen tender, firm lesion, 2 cm diameter not fluctuant.   Psychiatric: She has a normal mood and affect.  Nursing note and vitals reviewed.   ED Course  Procedures (including critical care time)  Labs Review Labs Reviewed - No data to display  Imaging Review No results found.   Visual Acuity Review  Right Eye Distance:   Left Eye Distance:   Bilateral Distance:    Right Eye Near:   Left Eye Near:    Bilateral Near:         MDM   1. Abscess     Patient is reassured that there are no issues that require transfer to higher level of care at this time or additional tests. Patient is advised to continue home symptomatic treatment. Patient is advised that if there are new or worsening symptoms to attend the emergency department, contact primary care provider, or return to UC. Instructions of care provided discharged home in stable  condition.    THIS NOTE WAS GENERATED USING A VOICE RECOGNITION SOFTWARE PROGRAM. ALL REASONABLE EFFORTS  WERE MADE TO PROOFREAD THIS DOCUMENT FOR ACCURACY.  I have verbally reviewed the discharge instructions with the patient. A printed AVS was given to the patient.  All questions were answered prior to discharge.      Konrad Felix, [place] 01/02/16 1304

## 2016-07-28 NOTE — L&D Delivery Note (Signed)
Patient is 31 y.o. [id number] [redacted]w[redacted]d admitted for preterm labor. SROM around 2045. Prenatal course also complicated by history of preterm delivery and A1GDM.  Delivery Note At 1:24 AM a viable female was delivered via Vaginal, Spontaneous (Presentation: cephalic, OA).  APGAR: 8 ,9 ; weight pending  .   Placenta status: spontaneous,intact .  Cord: 3 vessel    Anesthesia:  none Episiotomy: None Lacerations: None Suture Repair: n/a Est. Blood Loss (mL): 250  Mom to postpartum.  Baby to NICU.    Upon arrival, patient was complete. She pushed with good maternal effort to deliver a viable female infant in cephalic, OA position over intact perineum. No nuchal cord present. Anterior shoulder delivered easily. Baby was noted to have good tone and place on maternal abdomen for oral suctioning, drying and stimulation. Delayed cord clamping performed. Placenta delivered spontaneously with gentle cord traction. Fundus firm with massage and Pitocin. Perineum inspected and found to have no laceration, which was found to be hemostatic. Counts of sharps, instruments, and lap pads were all correct. Placenta to pathology.   Dannielle Huh, DO [place] Fellow

## 2016-12-12 DIAGNOSIS — Z3200 Encounter for pregnancy test, result unknown: Secondary | ICD-10-CM | POA: Diagnosis not present

## 2017-01-19 ENCOUNTER — Encounter: Payer: Self-pay | Admitting: Advanced Practice Midwife

## 2017-01-19 ENCOUNTER — Ambulatory Visit (INDEPENDENT_AMBULATORY_CARE_PROVIDER_SITE_OTHER): Payer: BLUE CROSS/BLUE SHIELD | Admitting: Advanced Practice Midwife

## 2017-01-19 ENCOUNTER — Encounter: Payer: BLUE CROSS/BLUE SHIELD | Admitting: Family Medicine

## 2017-01-19 VITALS — BP 121/82 | HR 100 | Wt 163.0 lb

## 2017-01-19 DIAGNOSIS — Z8751 Personal history of pre-term labor: Secondary | ICD-10-CM

## 2017-01-19 DIAGNOSIS — Z113 Encounter for screening for infections with a predominantly sexual mode of transmission: Secondary | ICD-10-CM | POA: Diagnosis not present

## 2017-01-19 DIAGNOSIS — Z3492 Encounter for supervision of normal pregnancy, unspecified, second trimester: Secondary | ICD-10-CM

## 2017-01-19 DIAGNOSIS — O09212 Supervision of pregnancy with history of pre-term labor, second trimester: Secondary | ICD-10-CM

## 2017-01-19 DIAGNOSIS — O0992 Supervision of high risk pregnancy, unspecified, second trimester: Secondary | ICD-10-CM | POA: Diagnosis not present

## 2017-01-19 DIAGNOSIS — Z3491 Encounter for supervision of normal pregnancy, unspecified, first trimester: Secondary | ICD-10-CM

## 2017-01-19 DIAGNOSIS — O0990 Supervision of high risk pregnancy, unspecified, unspecified trimester: Secondary | ICD-10-CM

## 2017-01-19 HISTORY — DX: Personal history of pre-term labor: Z87.51

## 2017-01-19 NOTE — Progress Notes (Signed)
Pap smear completed at the [facility] May 2016

## 2017-01-19 NOTE — Patient Instructions (Signed)
First Trimester of Pregnancy The first trimester of pregnancy is from week 1 until the end of week 13 (months 1 through 3). A week after a sperm fertilizes an egg, the egg will implant on the wall of the uterus. This embryo will begin to develop into a baby. Genes from you and your partner will form the baby. The female genes will determine whether the baby will be a boy or a girl. At 6-8 weeks, the eyes and face will be formed, and the heartbeat can be seen on ultrasound. At the end of 12 weeks, all the baby's organs will be formed. Now that you are pregnant, you will want to do everything you can to have a healthy baby. Two of the most important things are to get good prenatal care and to follow your health care provider's instructions. Prenatal care is all the medical care you receive before the baby's birth. This care will help prevent, find, and treat any problems during the pregnancy and childbirth. Body changes during your first trimester Your body goes through many changes during pregnancy. The changes vary from woman to woman.  You may gain or lose a couple of pounds at first.  You may feel sick to your stomach (nauseous) and you may throw up (vomit). If the vomiting is uncontrollable, call your health care provider.  You may tire easily.  You may develop headaches that can be relieved by medicines. All medicines should be approved by your health care provider.  You may urinate more often. Painful urination may mean you have a bladder infection.  You may develop heartburn as a result of your pregnancy.  You may develop constipation because certain hormones are causing the muscles that push stool through your intestines to slow down.  You may develop hemorrhoids or swollen veins (varicose veins).  Your breasts may begin to grow larger and become tender. Your nipples may stick out more, and the tissue that surrounds them (areola) may become darker.  Your gums may bleed and may be  sensitive to brushing and flossing.  Dark spots or blotches (chloasma, mask of pregnancy) may develop on your face. This will likely fade after the baby is born.  Your menstrual periods will stop.  You may have a loss of appetite.  You may develop cravings for certain kinds of food.  You may have changes in your emotions from day to day, such as being excited to be pregnant or being concerned that something may go wrong with the pregnancy and baby.  You may have more vivid and strange dreams.  You may have changes in your hair. These can include thickening of your hair, rapid growth, and changes in texture. Some women also have hair loss during or after pregnancy, or hair that feels dry or thin. Your hair will most likely return to normal after your baby is born.  What to expect at prenatal visits During a routine prenatal visit:  You will be weighed to make sure you and the baby are growing normally.  Your blood pressure will be taken.  Your abdomen will be measured to track your baby's growth.  The fetal heartbeat will be listened to between weeks 10 and 14 of your pregnancy.  Test results from any previous visits will be discussed.  Your health care provider may ask you:  How you are feeling.  If you are feeling the baby move.  If you have had any abnormal symptoms, such as leaking fluid, bleeding, severe headaches,   or abdominal cramping.  If you are using any tobacco products, including cigarettes, chewing tobacco, and electronic cigarettes.  If you have any questions.  Other tests that may be performed during your first trimester include:  Blood tests to find your blood type and to check for the presence of any previous infections. The tests will also be used to check for low iron levels (anemia) and protein on red blood cells (Rh antibodies). Depending on your risk factors, or if you previously had diabetes during pregnancy, you may have tests to check for high blood  sugar that affects pregnant women (gestational diabetes).  Urine tests to check for infections, diabetes, or protein in the urine.  An ultrasound to confirm the proper growth and development of the baby.  Fetal screens for spinal cord problems (spina bifida) and Down syndrome.  HIV (human immunodeficiency virus) testing. Routine prenatal testing includes screening for HIV, unless you choose not to have this test.  You may need other tests to make sure you and the baby are doing well.  Follow these instructions at home: Medicines  Follow your health care provider's instructions regarding medicine use. Specific medicines may be either safe or unsafe to take during pregnancy.  Take a prenatal vitamin that contains at least 600 micrograms (mcg) of folic acid.  If you develop constipation, try taking a stool softener if your health care provider approves. Eating and drinking  Eat a balanced diet that includes fresh fruits and vegetables, whole grains, good sources of protein such as meat, eggs, or tofu, and low-fat dairy. Your health care provider will help you determine the amount of weight gain that is right for you.  Avoid raw meat and uncooked cheese. These carry germs that can cause birth defects in the baby.  Eating four or five small meals rather than three large meals a day may help relieve nausea and vomiting. If you start to feel nauseous, eating a few soda crackers can be helpful. Drinking liquids between meals, instead of during meals, also seems to help ease nausea and vomiting.  Limit foods that are high in fat and processed sugars, such as fried and sweet foods.  To prevent constipation: ? Eat foods that are high in fiber, such as fresh fruits and vegetables, whole grains, and beans. ? Drink enough fluid to keep your urine clear or pale yellow. Activity  Exercise only as directed by your health care provider. Most women can continue their usual exercise routine during  pregnancy. Try to exercise for 30 minutes at least 5 days a week. Exercising will help you: ? Control your weight. ? Stay in shape. ? Be prepared for labor and delivery.  Experiencing pain or cramping in the lower abdomen or lower back is a good sign that you should stop exercising. Check with your health care provider before continuing with normal exercises.  Try to avoid standing for long periods of time. Move your legs often if you must stand in one place for a long time.  Avoid heavy lifting.  Wear low-heeled shoes and practice good posture.  You may continue to have sex unless your health care provider tells you not to. Relieving pain and discomfort  Wear a good support bra to relieve breast tenderness.  Take warm sitz baths to soothe any pain or discomfort caused by hemorrhoids. Use hemorrhoid cream if your health care provider approves.  Rest with your legs elevated if you have leg cramps or low back pain.  If you develop   varicose veins in your legs, wear support hose. Elevate your feet for 15 minutes, 3-4 times a day. Limit salt in your diet. Prenatal care  Schedule your prenatal visits by the twelfth week of pregnancy. They are usually scheduled monthly at first, then more often in the last 2 months before delivery.  Write down your questions. Take them to your prenatal visits.  Keep all your prenatal visits as told by your health care provider. This is important. Safety  Wear your seat belt at all times when driving.  Make a list of emergency phone numbers, including numbers for family, friends, the hospital, and police and fire departments. General instructions  Ask your health care provider for a referral to a local prenatal education class. Begin classes no later than the beginning of month 6 of your pregnancy.  Ask for help if you have counseling or nutritional needs during pregnancy. Your health care provider can offer advice or refer you to specialists for help  with various needs.  Do not use hot tubs, steam rooms, or saunas.  Do not douche or use tampons or scented sanitary pads.  Do not cross your legs for long periods of time.  Avoid cat litter boxes and soil used by cats. These carry germs that can cause birth defects in the baby and possibly loss of the fetus by miscarriage or stillbirth.  Avoid all smoking, herbs, alcohol, and medicines not prescribed by your health care provider. Chemicals in these products affect the formation and growth of the baby.  Do not use any products that contain nicotine or tobacco, such as cigarettes and e-cigarettes. If you need help quitting, ask your health care provider. You may receive counseling support and other resources to help you quit.  Schedule a dentist appointment. At home, brush your teeth with a soft toothbrush and be gentle when you floss. Contact a health care provider if:  You have dizziness.  You have mild pelvic cramps, pelvic pressure, or nagging pain in the abdominal area.  You have persistent nausea, vomiting, or diarrhea.  You have a bad smelling vaginal discharge.  You have pain when you urinate.  You notice increased swelling in your face, hands, legs, or ankles.  You are exposed to fifth disease or chickenpox.  You are exposed to German measles (rubella) and have never had it. Get help right away if:  You have a fever.  You are leaking fluid from your vagina.  You have spotting or bleeding from your vagina.  You have severe abdominal cramping or pain.  You have rapid weight gain or loss.  You vomit blood or material that looks like coffee grounds.  You develop a severe headache.  You have shortness of breath.  You have any kind of trauma, such as from a fall or a car accident. Summary  The first trimester of pregnancy is from week 1 until the end of week 13 (months 1 through 3).  Your body goes through many changes during pregnancy. The changes vary from  woman to woman.  You will have routine prenatal visits. During those visits, your health care provider will examine you, discuss any test results you may have, and talk with you about how you are feeling. This information is not intended to replace advice given to you by your health care provider. Make sure you discuss any questions you have with your health care provider. Document Released: 07/08/2001 Document Revised: 06/25/2016 Document Reviewed: 06/25/2016 Elsevier Interactive Patient Education  2017 Elsevier   Inc.  

## 2017-01-19 NOTE — Progress Notes (Signed)
Subjective:    Natasha [name] is a [id number] [redacted]w[redacted]d being seen today for her first obstetrical visit.  Her obstetrical history is significant for Preterm delivery x 3. Patient does intend to breast feed. Pregnancy history fully reviewed.  Patient reports no complaints.  Vitals:   01/19/17 1301  BP: 121/82  Pulse: 100  Weight: 163 lb (73.9 kg)    HISTORY: OB History  Gravida Para Term Preterm AB Living  4 3 0 3   3  SAB TAB Ectopic Multiple Live Births          3    # Outcome Date GA Lbr Len/2nd Weight Sex Delivery Anes PTL Lv  4 Current           3 Preterm 11/02/10 [redacted]w[redacted]d   F   Y LIV  2 Preterm 01/23/08 [redacted]w[redacted]d  5 lb 4 oz (2.381 kg)    Y LIV  1 Preterm 03/08/01 [redacted]w[redacted]d  4 lb 5 oz (1.956 kg) F Vag-Spont  Y LIV     Past Medical History:  Diagnosis Date  . No pertinent past medical history   . Vaginal Pap smear, abnormal    Past Surgical History:  Procedure Laterality Date  . IUD REMOVAL    . LAPAROSCOPY  02/06/2011   Procedure: LAPAROSCOPY OPERATIVE;  Surgeon: Osborne Oman, MD;  Location: [facility];  Service: Gynecology;  Laterality: N/A;  Removal of Mirena IUD from sigmoid serosa, enterolysis, proctoscopy  . NO PAST SURGERIES    . PROCTOSCOPY  02/06/2011   Procedure: PROCTOSCOPY;  Surgeon: Adin Hector, MD;  Location: [facility];  Service: General;  Laterality: N/A;   Family History  Problem Relation Age of Onset  . Hypertension Paternal Grandmother   . Hypertension Paternal Grandfather   . Hypertension Maternal Grandmother   . Hypertension Maternal Grandfather      Exam    Uterus:     Pelvic Exam:    Perineum: No Hemorrhoids, Normal Perineum   Vulva: Bartholin's, Urethra, Skene's normal   Vagina:  normal discharge   pH:    Cervix: no cervical motion tenderness   Cervix is firm, closed, and about 2-2.5cm long   Adnexa: no mass, fullness, tenderness   Bony Pelvis: gynecoid  System: Breast:  normal appearance, no masses or tenderness   Skin: normal coloration and  turgor, no rashes    Neurologic: oriented, grossly non-focal   Extremities: normal strength, tone, and muscle mass   HEENT neck supple with midline trachea   Mouth/Teeth mucous membranes moist, pharynx normal without lesions   Neck supple   Cardiovascular: regular rate and rhythm   Respiratory:  appears well, vitals normal, no respiratory distress, acyanotic, normal RR, ear and throat exam is normal, neck free of mass or lymphadenopathy, chest clear, no wheezing, crepitations, rhonchi, normal symmetric air entry   Abdomen: soft, non-tender; bowel sounds normal; no masses,  no organomegaly   Urinary: urethral meatus normal      Assessment:    Pregnancy: [id number] Patient Active Problem List   Diagnosis Date Noted  . History of preterm delivery 01/19/2017  . 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 Prenatal vitamins. Problem list reviewed and updated. Genetic Screening discussed First Screen: ordered.  Ultrasound discussed; fetal survey: ordered.  Follow up in 4 weeks. 50% of 30 min visit spent on counseling and coordination of care.   Routines reviewed Discussed Renata Caprice, patient agrees Baseline cervix  exam done, closed and firm   Hansel Feinstein 01/19/2017

## 2017-01-20 LAB — PRENATAL PROFILE I(LABCORP)
Antibody Screen: NEGATIVE
Basophils Absolute: 0 10*3/uL (ref 0.0–0.2)
Basos: 0 %
EOS (ABSOLUTE): 0.1 10*3/uL (ref 0.0–0.4)
Eos: 1 %
HEMOGLOBIN: 12.3 g/dL (ref 11.1–15.9)
Hematocrit: 37.7 % (ref 34.0–46.6)
Hepatitis B Surface Ag: NEGATIVE
Immature Grans (Abs): 0 10*3/uL (ref 0.0–0.1)
Immature Granulocytes: 0 %
LYMPHS ABS: 2.6 10*3/uL (ref 0.7–3.1)
Lymphs: 31 %
MCH: 29.4 pg (ref 26.6–33.0)
MCHC: 32.6 g/dL (ref 31.5–35.7)
MCV: 90 fL (ref 79–97)
Monocytes Absolute: 0.5 10*3/uL (ref 0.1–0.9)
Monocytes: 6 %
NEUTROS PCT: 62 %
Neutrophils Absolute: 5.1 10*3/uL (ref 1.4–7.0)
Platelets: 300 10*3/uL (ref 150–379)
RBC: 4.19 x10E6/uL (ref 3.77–5.28)
RDW: 14.1 % (ref 12.3–15.4)
RPR Ser Ql: NONREACTIVE
Rh Factor: POSITIVE
Rubella Antibodies, IGG: 5.5 index (ref >0.99)
WBC: 8.3 10*3/uL (ref 3.4–10.8)

## 2017-01-20 LAB — CERVICOVAGINAL ANCILLARY ONLY
CHLAMYDIA, DNA PROBE: NEGATIVE
Neisseria Gonorrhea: NEGATIVE

## 2017-01-20 LAB — HIV ANTIBODY (ROUTINE TESTING W REFLEX): HIV SCREEN 4TH GENERATION: NONREACTIVE

## 2017-01-21 LAB — CULTURE, OB URINE

## 2017-01-21 LAB — URINE CULTURE, OB REFLEX

## 2017-01-26 DIAGNOSIS — O0990 Supervision of high risk pregnancy, unspecified, unspecified trimester: Secondary | ICD-10-CM | POA: Insufficient documentation

## 2017-02-02 ENCOUNTER — Encounter (HOSPITAL_COMMUNITY): Payer: Self-pay

## 2017-02-02 ENCOUNTER — Ambulatory Visit (HOSPITAL_COMMUNITY)
Admission: RE | Admit: 2017-02-02 | Discharge: 2017-02-02 | Disposition: A | Discharge status: Home / Self Care | Payer: BLUE CROSS/BLUE SHIELD | Source: Ambulatory Visit | Attending: Advanced Practice Midwife | Admitting: Advanced Practice Midwife

## 2017-02-02 DIAGNOSIS — Z3A13 13 weeks gestation of pregnancy: Secondary | ICD-10-CM | POA: Insufficient documentation

## 2017-02-02 DIAGNOSIS — O09211 Supervision of pregnancy with history of pre-term labor, first trimester: Secondary | ICD-10-CM | POA: Diagnosis not present

## 2017-02-02 DIAGNOSIS — O281 Abnormal biochemical finding on antenatal screening of mother: Secondary | ICD-10-CM | POA: Diagnosis not present

## 2017-02-02 DIAGNOSIS — Z3491 Encounter for supervision of normal pregnancy, unspecified, first trimester: Secondary | ICD-10-CM

## 2017-02-02 DIAGNOSIS — Z3682 Encounter for antenatal screening for nuchal translucency: Secondary | ICD-10-CM | POA: Diagnosis not present

## 2017-02-11 ENCOUNTER — Other Ambulatory Visit (HOSPITAL_COMMUNITY): Payer: Self-pay

## 2017-02-16 ENCOUNTER — Encounter: Payer: Self-pay | Admitting: Family Medicine

## 2017-02-16 ENCOUNTER — Ambulatory Visit (INDEPENDENT_AMBULATORY_CARE_PROVIDER_SITE_OTHER): Payer: BLUE CROSS/BLUE SHIELD | Admitting: Family Medicine

## 2017-02-16 VITALS — BP 130/67 | HR 102 | Wt 162.1 lb

## 2017-02-16 DIAGNOSIS — Z8751 Personal history of pre-term labor: Secondary | ICD-10-CM

## 2017-02-16 DIAGNOSIS — O0990 Supervision of high risk pregnancy, unspecified, unspecified trimester: Secondary | ICD-10-CM

## 2017-02-16 DIAGNOSIS — O09212 Supervision of pregnancy with history of pre-term labor, second trimester: Secondary | ICD-10-CM

## 2017-02-16 DIAGNOSIS — O0992 Supervision of high risk pregnancy, unspecified, second trimester: Secondary | ICD-10-CM

## 2017-02-16 NOTE — Patient Instructions (Signed)
Ready to Activate Your MyChart Account? Your Activation Code is:  [email or web address] Expires: 04/17/2017  1:11 PM   Go to  [email or web address]  or download the MyChart app.   Go to the app store, search "MyChart", open the app, select [facility], and activate your account.   Need technical help? Call 336-83-CHART, or email [email or web address] For Medical questions please contact your provider.

## 2017-02-16 NOTE — Progress Notes (Signed)
   PRENATAL VISIT NOTE  Subjective:  Natasha [name] is a 31 y.o. [id number] at [redacted]w[redacted]d being seen today for ongoing prenatal care.  She is currently monitored for the following issues for this high-risk pregnancy and has Post partum depression; History of preterm delivery; and Supervision of high risk pregnancy, antepartum on her problem list.  Patient reports no complaints.  Contractions: Not present. Vag. Bleeding: None.  Movement: Present. Denies leaking of fluid.   The following portions of the patient's history were reviewed and updated as appropriate: allergies, current medications, past family history, past medical history, past social history, past surgical history and problem list. Problem list updated.  Objective:   Vitals:   02/16/17 1246  BP: 130/67  Pulse: (!) 102  Weight: 162 lb 1.6 oz (73.5 kg)    Fetal Status: Fetal Heart Rate (bpm): 154   Movement: Present     General:  Alert, oriented and cooperative. Patient is in no acute distress.  Skin: Skin is warm and dry. No rash noted.   Cardiovascular: Normal heart rate noted  Respiratory: Normal respiratory effort, no problems with respiration noted  Abdomen: Soft, gravid, appropriate for gestational age.  Pain/Pressure: Present     Pelvic: Cervical exam deferred        Extremities: Normal range of motion.  Edema: None  Mental Status:  Normal mood and affect. Normal behavior. Normal judgment and thought content.   Assessment and Plan:  Pregnancy: [id number] at [redacted]w[redacted]d  1. Supervision of high risk pregnancy, antepartum FHT and FH normal for GA.   2. History of preterm delivery Makena paperwork signed - [place] MFM OB DETAIL +14 WK; Future - AFP, Serum, Open Spina Bifida - [place] MFM OB Transvaginal; Future  Preterm labor symptoms and general obstetric precautions including but not limited to vaginal bleeding, contractions, leaking of fluid and fetal movement were reviewed in detail with the patient. Please refer to After Visit Summary  for other counseling recommendations.  Return in about 4 weeks (around 03/16/2017) for HR OB f/u.   Truett Mainland, DO

## 2017-02-16 NOTE — Progress Notes (Signed)
Makena application faxed. Pt notified that we will contact her once medication arrives.  Pt stated understanding.  Anatomy and cervical length [place] scheduled for August 17 @ 1300.  Pt notified.

## 2017-02-20 LAB — AFP, SERUM, OPEN SPINA BIFIDA
AFP MoM: 1.86
AFP VALUE AFPOSL: 56.9 ng/mL
GEST. AGE ON COLLECTION DATE: 15.3 wk
MATERNAL AGE AT EDD: 33.6 a
OSBR Risk 1 IN: 2217
Test Results:: NEGATIVE
WEIGHT: 162 [lb_av]

## 2017-03-03 ENCOUNTER — Telehealth: Payer: Self-pay

## 2017-03-03 NOTE — Telephone Encounter (Signed)
Patient called wanting to know when can she start [place]. I called patient but got no answer. Left message stating that the medication has not come in yet but when it does we will notify her.Left message to call the office with any further questions.

## 2017-03-13 ENCOUNTER — Other Ambulatory Visit: Payer: Self-pay

## 2017-03-13 ENCOUNTER — Ambulatory Visit (HOSPITAL_COMMUNITY)
Admission: RE | Admit: 2017-03-13 | Discharge: 2017-03-13 | Disposition: A | Discharge status: Home / Self Care | Payer: BLUE CROSS/BLUE SHIELD | Source: Ambulatory Visit | Attending: Family Medicine | Admitting: Family Medicine

## 2017-03-13 ENCOUNTER — Encounter (HOSPITAL_COMMUNITY): Payer: Self-pay

## 2017-03-13 ENCOUNTER — Other Ambulatory Visit: Payer: Self-pay | Admitting: Family Medicine

## 2017-03-13 DIAGNOSIS — Z3689 Encounter for other specified antenatal screening: Secondary | ICD-10-CM | POA: Diagnosis not present

## 2017-03-13 DIAGNOSIS — Z3686 Encounter for antenatal screening for cervical length: Secondary | ICD-10-CM | POA: Diagnosis not present

## 2017-03-13 DIAGNOSIS — Z8751 Personal history of pre-term labor: Secondary | ICD-10-CM

## 2017-03-13 DIAGNOSIS — O09212 Supervision of pregnancy with history of pre-term labor, second trimester: Secondary | ICD-10-CM | POA: Insufficient documentation

## 2017-03-13 DIAGNOSIS — Z3A18 18 weeks gestation of pregnancy: Secondary | ICD-10-CM

## 2017-03-16 ENCOUNTER — Ambulatory Visit (INDEPENDENT_AMBULATORY_CARE_PROVIDER_SITE_OTHER): Payer: BLUE CROSS/BLUE SHIELD | Admitting: Obstetrics and Gynecology

## 2017-03-16 ENCOUNTER — Other Ambulatory Visit (HOSPITAL_COMMUNITY): Payer: Self-pay | Admitting: *Deleted

## 2017-03-16 ENCOUNTER — Encounter: Payer: Self-pay | Admitting: Obstetrics and Gynecology

## 2017-03-16 VITALS — BP 132/78 | HR 98 | Wt 166.4 lb

## 2017-03-16 DIAGNOSIS — O280 Abnormal hematological finding on antenatal screening of mother: Secondary | ICD-10-CM

## 2017-03-16 DIAGNOSIS — O09899 Supervision of other high risk pregnancies, unspecified trimester: Secondary | ICD-10-CM

## 2017-03-16 DIAGNOSIS — Z8751 Personal history of pre-term labor: Secondary | ICD-10-CM

## 2017-03-16 DIAGNOSIS — O09892 Supervision of other high risk pregnancies, second trimester: Secondary | ICD-10-CM

## 2017-03-16 DIAGNOSIS — O0990 Supervision of high risk pregnancy, unspecified, unspecified trimester: Secondary | ICD-10-CM

## 2017-03-16 DIAGNOSIS — O0992 Supervision of high risk pregnancy, unspecified, second trimester: Secondary | ICD-10-CM

## 2017-03-16 LAB — POCT URINALYSIS DIP (DEVICE)
BILIRUBIN URINE: NEGATIVE
Glucose, UA: NEGATIVE mg/dL
Ketones, ur: NEGATIVE mg/dL
LEUKOCYTES UA: NEGATIVE
NITRITE: NEGATIVE
PH: 7.5 (ref 5.0–8.0)
Protein, ur: NEGATIVE mg/dL
SPECIFIC GRAVITY, URINE: 1.02 (ref 1.005–1.030)
Urobilinogen, UA: 1 mg/dL (ref 0.0–1.0)

## 2017-03-16 NOTE — Progress Notes (Signed)
Prenatal Visit Note Date: 03/16/2017 Clinic: [facility]  Subjective:  Natasha [name] is a 31 y.o. [phone] at [redacted]w[redacted]d being seen today for ongoing prenatal care.  She is currently monitored for the following issues for this high-risk pregnancy and has Post partum depression; History of preterm delivery; Supervision of high risk pregnancy, antepartum; and High risk pregnancy with low PAPPA (pregnancy-associated plasma protein A) on her problem list.  Patient reports no complaints.   Contractions: Not present. Vag. Bleeding: None.  Movement: Present. Denies leaking of fluid.   The following portions of the patient's history were reviewed and updated as appropriate: allergies, current medications, past family history, past medical history, past social history, past surgical history and problem list. Problem list updated.  Objective:   Vitals:   03/16/17 1258  BP: 132/78  Pulse: 98  Weight: 166 lb 6.4 oz (75.5 kg)    Fetal Status: Fetal Heart Rate (bpm): 149   Movement: Present     General:  Alert, oriented and cooperative. Patient is in no acute distress.  Skin: Skin is warm and dry. No rash noted.   Cardiovascular: Normal heart rate noted  Respiratory: Normal respiratory effort, no problems with respiration noted  Abdomen: Soft, gravid, appropriate for gestational age. Pain/Pressure: Present     Pelvic:  Cervical exam deferred        Extremities: Normal range of motion.  Edema: None  Mental Status: Normal mood and affect. Normal behavior. Normal judgment and thought content.   Urinalysis: Urine Protein: Negative Urine Glucose: Negative  Assessment and Plan:  Pregnancy: [id number] at [redacted]w[redacted]d  1. Supervision of high risk pregnancy, antepartum Routine care.   2. History of preterm delivery 8/17 negative anatomy u/s and CL >3cm on TVUS. Rpt scheduled by MFM for routine surveillance. Insurance company called her last week and RN to work on today about getting her 17p in  and started  3. High risk pregnancy with low PAPPA (pregnancy-associated plasma protein A) surveiillance growth in the 3rd trimester  Preterm labor symptoms and general obstetric precautions including but not limited to vaginal bleeding, contractions, leaking of fluid and fetal movement were reviewed in detail with the patient. Please refer to After Visit Summary for other counseling recommendations.  Return in about 1 week (around 03/23/2017) for for 17p and qwk thereafter. 3wk rob.   Aletha Halim, MD

## 2017-03-20 ENCOUNTER — Telehealth: Payer: Self-pay

## 2017-03-20 NOTE — Telephone Encounter (Signed)
Patient called stating that she needs a refill on her Oxycodone. Called patient to inform her that she will need to be seen in the office for a refill. Patient verbalized understanding and had no questions.

## 2017-03-23 ENCOUNTER — Ambulatory Visit: Payer: BLUE CROSS/BLUE SHIELD

## 2017-03-24 ENCOUNTER — Ambulatory Visit: Payer: BLUE CROSS/BLUE SHIELD

## 2017-03-24 DIAGNOSIS — Z8751 Personal history of pre-term labor: Secondary | ICD-10-CM

## 2017-03-24 NOTE — Progress Notes (Signed)
Patient  Presented to the office today for a 17-p however we have not received her Makena injection. I have placed a call to our point of contact with Makena and left a detail message regarding patient medications and when she can expect to start receiving.

## 2017-03-25 ENCOUNTER — Telehealth: Payer: Self-pay | Admitting: General Practice

## 2017-03-25 NOTE — Telephone Encounter (Signed)
[facility] called to set up shipment of patient's Makena. Called back and asked for med to be over-nighted. Makena should be here Friday.    Called and informed patient. Discussed with patient that she call prior to coming to ensure we have medication. Patient verbalized understanding and had no questions

## 2017-03-31 ENCOUNTER — Ambulatory Visit (INDEPENDENT_AMBULATORY_CARE_PROVIDER_SITE_OTHER): Payer: BLUE CROSS/BLUE SHIELD

## 2017-03-31 DIAGNOSIS — Z8751 Personal history of pre-term labor: Secondary | ICD-10-CM

## 2017-03-31 DIAGNOSIS — O09212 Supervision of pregnancy with history of pre-term labor, second trimester: Secondary | ICD-10-CM

## 2017-03-31 MED ORDER — HYDROXYPROGESTERONE CAPROATE 250 MG/ML IM OIL
250.0000 mg | TOPICAL_OIL | Freq: Once | INTRAMUSCULAR | Status: AC
  Administered 2017-03-31: 250 mg via INTRAMUSCULAR

## 2017-03-31 NOTE — Progress Notes (Signed)
Patient presented to the office for a 17p injection. Patient tolerated well. Advised patient to schedule next 17p at the front desk. Patient verbalized understanding and had no questions.

## 2017-04-01 NOTE — Progress Notes (Signed)
Agree with nursing staff's documentation of this patient's clinic encounter.  Kerry Hough, PA-C

## 2017-04-07 ENCOUNTER — Ambulatory Visit (INDEPENDENT_AMBULATORY_CARE_PROVIDER_SITE_OTHER): Payer: BLUE CROSS/BLUE SHIELD | Admitting: Obstetrics & Gynecology

## 2017-04-07 ENCOUNTER — Encounter: Payer: BLUE CROSS/BLUE SHIELD | Admitting: Obstetrics and Gynecology

## 2017-04-07 ENCOUNTER — Encounter: Payer: BLUE CROSS/BLUE SHIELD | Admitting: Family Medicine

## 2017-04-07 VITALS — BP 132/71 | HR 103 | Wt 167.1 lb

## 2017-04-07 DIAGNOSIS — Z8751 Personal history of pre-term labor: Secondary | ICD-10-CM

## 2017-04-07 DIAGNOSIS — O0990 Supervision of high risk pregnancy, unspecified, unspecified trimester: Secondary | ICD-10-CM

## 2017-04-07 DIAGNOSIS — O09212 Supervision of pregnancy with history of pre-term labor, second trimester: Secondary | ICD-10-CM

## 2017-04-07 MED ORDER — HYDROXYPROGESTERONE CAPROATE 250 MG/ML IM OIL
250.0000 mg | TOPICAL_OIL | Freq: Once | INTRAMUSCULAR | Status: AC
  Administered 2017-04-07: 250 mg via INTRAMUSCULAR

## 2017-04-07 NOTE — Progress Notes (Signed)
   PRENATAL VISIT NOTE  Subjective:  Natasha [name] is a 31 y.o. [phone] at [redacted]w[redacted]d being seen today for ongoing prenatal care.  She is currently monitored for the following issues for this high-risk pregnancy and has Post partum depression; History of preterm delivery; Supervision of high risk pregnancy, antepartum; and High risk pregnancy with low PAPPA (pregnancy-associated plasma protein A) on her problem list.  Patient reports no complaints.  Contractions: Not present.  .  Movement: Present. Denies leaking of fluid.   The following portions of the patient's history were reviewed and updated as appropriate: allergies, current medications, past family history, past medical history, past social history, past surgical history and problem list. Problem list updated.  Objective:   Vitals:   04/07/17 1538  BP: 132/71  Pulse: (!) 103  Weight: 75.8 kg (167 lb 1.6 oz)    Fetal Status: Fetal Heart Rate (bpm): 151   Movement: Present     General:  Alert, oriented and cooperative. Patient is in no acute distress.  Skin: Skin is warm and dry. No rash noted.   Cardiovascular: Normal heart rate noted  Respiratory: Normal respiratory effort, no problems with respiration noted  Abdomen: Soft, gravid, appropriate for gestational age.  Pain/Pressure: Present     Pelvic: Cervical exam deferred        Extremities: Normal range of motion.     Mental Status:  Normal mood and affect. Normal behavior. Normal judgment and thought content.   Assessment and Plan:  Pregnancy: [id number] at [redacted]w[redacted]d  1. Supervision of high risk pregnancy, antepartum F/u US in 3 days  2. History of preterm delivery 17 P weekly   Preterm labor symptoms and general obstetric precautions including but not limited to vaginal bleeding, contractions, leaking of fluid and fetal movement were reviewed in detail with the patient. Please refer to After Visit Summary for other counseling recommendations.  Return in about 4 weeks (around  05/05/2017).   Emeterio Reeve, MD

## 2017-04-07 NOTE — Patient Instructions (Signed)
Second Trimester of Pregnancy The second trimester is from week 13 through week 28, month 4 through 6. This is often the time in pregnancy that you feel your best. Often times, morning sickness has lessened or quit. You may have more energy, and you may get hungry more often. Your unborn baby (fetus) is growing rapidly. At the end of the sixth month, he or she is about 9 inches long and weighs about 1 pounds. You will likely feel the baby move (quickening) between 18 and 20 weeks of pregnancy. Follow these instructions at home:  Avoid all smoking, herbs, and alcohol. Avoid drugs not approved by your doctor.  Do not use any tobacco products, including cigarettes, chewing tobacco, and electronic cigarettes. If you need help quitting, ask your doctor. You may get counseling or other support to help you quit.  Only take medicine as told by your doctor. Some medicines are safe and some are not during pregnancy.  Exercise only as told by your doctor. Stop exercising if you start having cramps.  Eat regular, healthy meals.  Wear a good support bra if your breasts are tender.  Do not use hot tubs, steam rooms, or saunas.  Wear your seat belt when driving.  Avoid raw meat, uncooked cheese, and liter boxes and soil used by cats.  Take your prenatal vitamins.  Take 1500-2000 milligrams of calcium daily starting at the 20th week of pregnancy until you deliver your baby.  Try taking medicine that helps you poop (stool softener) as needed, and if your doctor approves. Eat more fiber by eating fresh fruit, vegetables, and whole grains. Drink enough fluids to keep your pee (urine) clear or pale yellow.  Take warm water baths (sitz baths) to soothe pain or discomfort caused by hemorrhoids. Use hemorrhoid cream if your doctor approves.  If you have puffy, bulging veins (varicose veins), wear support hose. Raise (elevate) your feet for 15 minutes, 3-4 times a day. Limit salt in your diet.  Avoid heavy  lifting, wear low heals, and sit up straight.  Rest with your legs raised if you have leg cramps or low back pain.  Visit your dentist if you have not gone during your pregnancy. Use a soft toothbrush to brush your teeth. Be gentle when you floss.  You can have sex (intercourse) unless your doctor tells you not to.  Go to your doctor visits. Get help if:  You feel dizzy.  You have mild cramps or pressure in your lower belly (abdomen).  You have a nagging pain in your belly area.  You continue to feel sick to your stomach (nauseous), throw up (vomit), or have watery poop (diarrhea).  You have bad smelling fluid coming from your vagina.  You have pain with peeing (urination). Get help right away if:  You have a fever.  You are leaking fluid from your vagina.  You have spotting or bleeding from your vagina.  You have severe belly cramping or pain.  You lose or gain weight rapidly.  You have trouble catching your breath and have chest pain.  You notice sudden or extreme puffiness (swelling) of your face, hands, ankles, feet, or legs.  You have not felt the baby move in over an hour.  You have severe headaches that do not go away with medicine.  You have vision changes. This information is not intended to replace advice given to you by your health care provider. Make sure you discuss any questions you have with your health care   provider. Document Released: 10/08/2009 Document Revised: 12/20/2015 Document Reviewed: 09/14/2012 Elsevier Interactive Patient Education  2017 [employer]  

## 2017-04-07 NOTE — Progress Notes (Signed)
17 p given

## 2017-04-10 ENCOUNTER — Other Ambulatory Visit (HOSPITAL_COMMUNITY): Payer: Self-pay | Admitting: Obstetrics and Gynecology

## 2017-04-10 ENCOUNTER — Encounter (HOSPITAL_COMMUNITY): Payer: Self-pay

## 2017-04-10 ENCOUNTER — Ambulatory Visit (HOSPITAL_COMMUNITY)
Admission: RE | Admit: 2017-04-10 | Discharge: 2017-04-10 | Disposition: A | Discharge status: Home / Self Care | Payer: BLUE CROSS/BLUE SHIELD | Source: Ambulatory Visit | Attending: Obstetrics & Gynecology | Admitting: Obstetrics & Gynecology

## 2017-04-10 ENCOUNTER — Encounter: Payer: BLUE CROSS/BLUE SHIELD | Admitting: Obstetrics and Gynecology

## 2017-04-10 DIAGNOSIS — Z3A22 22 weeks gestation of pregnancy: Secondary | ICD-10-CM

## 2017-04-10 DIAGNOSIS — O09212 Supervision of pregnancy with history of pre-term labor, second trimester: Secondary | ICD-10-CM | POA: Diagnosis not present

## 2017-04-10 DIAGNOSIS — Z3686 Encounter for antenatal screening for cervical length: Secondary | ICD-10-CM | POA: Diagnosis not present

## 2017-04-10 DIAGNOSIS — Z8751 Personal history of pre-term labor: Secondary | ICD-10-CM

## 2017-04-10 DIAGNOSIS — O09292 Supervision of pregnancy with other poor reproductive or obstetric history, second trimester: Secondary | ICD-10-CM | POA: Diagnosis not present

## 2017-04-14 ENCOUNTER — Ambulatory Visit (INDEPENDENT_AMBULATORY_CARE_PROVIDER_SITE_OTHER): Payer: BLUE CROSS/BLUE SHIELD | Admitting: General Practice

## 2017-04-14 VITALS — BP 128/71 | HR 102 | Ht 61.0 in | Wt 169.1 lb

## 2017-04-14 DIAGNOSIS — O09212 Supervision of pregnancy with history of pre-term labor, second trimester: Secondary | ICD-10-CM

## 2017-04-14 DIAGNOSIS — Z8751 Personal history of pre-term labor: Secondary | ICD-10-CM

## 2017-04-14 MED ORDER — HYDROXYPROGESTERONE CAPROATE 250 MG/ML IM OIL
250.0000 mg | TOPICAL_OIL | Freq: Once | INTRAMUSCULAR | Status: AC
  Administered 2017-04-14: 250 mg via INTRAMUSCULAR

## 2017-04-21 ENCOUNTER — Ambulatory Visit (INDEPENDENT_AMBULATORY_CARE_PROVIDER_SITE_OTHER): Payer: BLUE CROSS/BLUE SHIELD

## 2017-04-21 VITALS — BP 132/68 | HR 94 | Wt 170.5 lb

## 2017-04-21 DIAGNOSIS — O09212 Supervision of pregnancy with history of pre-term labor, second trimester: Secondary | ICD-10-CM | POA: Diagnosis not present

## 2017-04-21 DIAGNOSIS — Z8751 Personal history of pre-term labor: Secondary | ICD-10-CM

## 2017-04-21 MED ORDER — HYDROXYPROGESTERONE CAPROATE 250 MG/ML IM OIL
250.0000 mg | TOPICAL_OIL | INTRAMUSCULAR | Status: DC
  Administered 2017-04-21 – 2017-06-03 (×7): 250 mg via INTRAMUSCULAR

## 2017-04-21 NOTE — Progress Notes (Addendum)
Patient presented to the office for 17p injection. Makena was ordered on 04/21/17.

## 2017-04-28 ENCOUNTER — Ambulatory Visit (INDEPENDENT_AMBULATORY_CARE_PROVIDER_SITE_OTHER): Payer: BLUE CROSS/BLUE SHIELD | Admitting: *Deleted

## 2017-04-28 VITALS — BP 109/66 | HR 101 | Wt 171.2 lb

## 2017-04-28 DIAGNOSIS — O09212 Supervision of pregnancy with history of pre-term labor, second trimester: Secondary | ICD-10-CM

## 2017-04-28 DIAGNOSIS — Z8751 Personal history of pre-term labor: Secondary | ICD-10-CM

## 2017-04-28 NOTE — Progress Notes (Addendum)
Natasha [name] 250 mg IM administered as scheduled.  Pt tolerated well. Next appt scheduled on 10/9.  Medication refill requested from [facility]

## 2017-05-05 ENCOUNTER — Ambulatory Visit (INDEPENDENT_AMBULATORY_CARE_PROVIDER_SITE_OTHER): Payer: BLUE CROSS/BLUE SHIELD | Admitting: Family Medicine

## 2017-05-05 VITALS — BP 107/66 | HR 107 | Wt 168.8 lb

## 2017-05-05 DIAGNOSIS — O09212 Supervision of pregnancy with history of pre-term labor, second trimester: Secondary | ICD-10-CM | POA: Diagnosis not present

## 2017-05-05 DIAGNOSIS — Z8751 Personal history of pre-term labor: Secondary | ICD-10-CM

## 2017-05-05 DIAGNOSIS — O0992 Supervision of high risk pregnancy, unspecified, second trimester: Secondary | ICD-10-CM

## 2017-05-05 DIAGNOSIS — O0990 Supervision of high risk pregnancy, unspecified, unspecified trimester: Secondary | ICD-10-CM

## 2017-05-05 DIAGNOSIS — Z23 Encounter for immunization: Secondary | ICD-10-CM

## 2017-05-05 NOTE — Progress Notes (Signed)
   PRENATAL VISIT NOTE  Subjective:  Natasha [name] is a 31 y.o. [phone] at [redacted]w[redacted]d being seen today for ongoing prenatal care.  She is currently monitored for the following issues for this high-risk pregnancy and has Post partum depression; History of preterm delivery; Supervision of high risk pregnancy, antepartum; and High risk pregnancy with low PAPPA (pregnancy-associated plasma protein A) on her problem list.  Patient reports no complaints.  Contractions: Not present. Vag. Bleeding: None.  Movement: Present. Denies leaking of fluid.   The following portions of the patient's history were reviewed and updated as appropriate: allergies, current medications, past family history, past medical history, past social history, past surgical history and problem list. Problem list updated.  Objective:   Vitals:   05/05/17 1248  BP: 107/66  Pulse: (!) 107  Weight: 168 lb 12.8 oz (76.6 kg)    Fetal Status: Fetal Heart Rate (bpm): 145 Fundal Height: 27 cm Movement: Present     General:  Alert, oriented and cooperative. Patient is in no acute distress.  Skin: Skin is warm and dry. No rash noted.   Cardiovascular: Normal heart rate noted  Respiratory: Normal respiratory effort, no problems with respiration noted  Abdomen: Soft, gravid, appropriate for gestational age.  Pain/Pressure: Present     Pelvic: Cervical exam deferred        Extremities: Normal range of motion.  Edema: Trace  Mental Status:  Normal mood and affect. Normal behavior. Normal judgment and thought content.   Assessment and Plan:  Pregnancy: [id number] at [redacted]w[redacted]d  1. Supervision of high risk pregnancy, antepartum - Flu Vaccine QUAD 36+ mos IM  2. History of preterm delivery Weekly 17 P  Preterm labor symptoms and general obstetric precautions including but not limited to vaginal bleeding, contractions, leaking of fluid and fetal movement were reviewed in detail with the patient. Please refer to After Visit Summary for other  counseling recommendations.  Return in 2 weeks (on 05/19/2017) for 28 wk labs, 17 P weekly.   Natasha Jude, MD

## 2017-05-05 NOTE — Patient Instructions (Signed)
Breastfeeding Deciding to breastfeed is one of the best choices you can make for you and your baby. A change in hormones during pregnancy causes your breast tissue to grow and increases the number and size of your milk ducts. These hormones also allow proteins, sugars, and fats from your blood supply to make breast milk in your milk-producing glands. Hormones prevent breast milk from being released before your baby is born as well as prompt milk flow after birth. Once breastfeeding has begun, thoughts of your baby, as well as his or her sucking or crying, can stimulate the release of milk from your milk-producing glands. Benefits of breastfeeding For Your Baby  Your first milk (colostrum) helps your baby's digestive system function better.  There are antibodies in your milk that help your baby fight off infections.  Your baby has a lower incidence of asthma, allergies, and sudden infant death syndrome.  The nutrients in breast milk are better for your baby than infant formulas and are designed uniquely for your baby's needs.  Breast milk improves your baby's brain development.  Your baby is less likely to develop other conditions, such as childhood obesity, asthma, or type 2 diabetes mellitus.  For You  Breastfeeding helps to create a very special bond between you and your baby.  Breastfeeding is convenient. Breast milk is always available at the correct temperature and costs nothing.  Breastfeeding helps to burn calories and helps you lose the weight gained during pregnancy.  Breastfeeding makes your uterus contract to its prepregnancy size faster and slows bleeding (lochia) after you give birth.  Breastfeeding helps to lower your risk of developing type 2 diabetes mellitus, osteoporosis, and breast or ovarian cancer later in life.  Signs that your baby is hungry Early Signs of Hunger  Increased alertness or activity.  Stretching.  Movement of the head from side to  side.  Movement of the head and opening of the mouth when the corner of the mouth or cheek is stroked (rooting).  Increased sucking sounds, smacking lips, cooing, sighing, or squeaking.  Hand-to-mouth movements.  Increased sucking of fingers or hands.  Late Signs of Hunger  Fussing.  Intermittent crying.  Extreme Signs of Hunger Signs of extreme hunger will require calming and consoling before your baby will be able to breastfeed successfully. Do not wait for the following signs of extreme hunger to occur before you initiate breastfeeding:  Restlessness.  A loud, strong cry.  Screaming.  Breastfeeding basics Breastfeeding Initiation  Find a comfortable place to sit or lie down, with your neck and back well supported.  Place a pillow or rolled up blanket under your baby to bring him or her to the level of your breast (if you are seated). Nursing pillows are specially designed to help support your arms and your baby while you breastfeed.  Make sure that your baby's abdomen is facing your abdomen.  Gently massage your breast. With your fingertips, massage from your chest wall toward your nipple in a circular motion. This encourages milk flow. You may need to continue this action during the feeding if your milk flows slowly.  Support your breast with 4 fingers underneath and your thumb above your nipple. Make sure your fingers are well away from your nipple and your baby's mouth.  Stroke your baby's lips gently with your finger or nipple.  When your baby's mouth is open wide enough, quickly bring your baby to your breast, placing your entire nipple and as much of the colored area   around your nipple (areola) as possible into your baby's mouth. ? More areola should be visible above your baby's upper lip than below the lower lip. ? Your baby's tongue should be between his or her lower gum and your breast.  Ensure that your baby's mouth is correctly positioned around your nipple  (latched). Your baby's lips should create a seal on your breast and be turned out (everted).  It is common for your baby to suck about 2-3 minutes in order to start the flow of breast milk.  Latching Teaching your baby how to latch on to your breast properly is very important. An improper latch can cause nipple pain and decreased milk supply for you and poor weight gain in your baby. Also, if your baby is not latched onto your nipple properly, he or she may swallow some air during feeding. This can make your baby fussy. Burping your baby when you switch breasts during the feeding can help to get rid of the air. However, teaching your baby to latch on properly is still the best way to prevent fussiness from swallowing air while breastfeeding. Signs that your baby has successfully latched on to your nipple:  Silent tugging or silent sucking, without causing you pain.  Swallowing heard between every 3-4 sucks.  Muscle movement above and in front of his or her ears while sucking.  Signs that your baby has not successfully latched on to nipple:  Sucking sounds or smacking sounds from your baby while breastfeeding.  Nipple pain.  If you think your baby has not latched on correctly, slip your finger into the corner of your baby's mouth to break the suction and place it between your baby's gums. Attempt breastfeeding initiation again. Signs of Successful Breastfeeding Signs from your baby:  A gradual decrease in the number of sucks or complete cessation of sucking.  Falling asleep.  Relaxation of his or her body.  Retention of a small amount of milk in his or her mouth.  Letting go of your breast by himself or herself.  Signs from you:  Breasts that have increased in firmness, weight, and size 1-3 hours after feeding.  Breasts that are softer immediately after breastfeeding.  Increased milk volume, as well as a change in milk consistency and color by the fifth day of  breastfeeding.  Nipples that are not sore, cracked, or bleeding.  Signs That Your Baby is Getting Enough Milk  Wetting at least 1-2 diapers during the first 24 hours after birth.  Wetting at least 5-6 diapers every 24 hours for the first week after birth. The urine should be clear or pale yellow by 5 days after birth.  Wetting 6-8 diapers every 24 hours as your baby continues to grow and develop.  At least 3 stools in a 24-hour period by age 5 days. The stool should be soft and yellow.  At least 3 stools in a 24-hour period by age 7 days. The stool should be seedy and yellow.  No loss of weight greater than 10% of birth weight during the first 3 days of age.  Average weight gain of 4-7 ounces (113-198 g) per week after age 4 days.  Consistent daily weight gain by age 5 days, without weight loss after the age of 2 weeks.  After a feeding, your baby may spit up a small amount. This is common. Breastfeeding frequency and duration Frequent feeding will help you make more milk and can prevent sore nipples and breast engorgement. Breastfeed when   you feel the need to reduce the fullness of your breasts or when your baby shows signs of hunger. This is called "breastfeeding on demand." Avoid introducing a pacifier to your baby while you are working to establish breastfeeding (the first 4-6 weeks after your baby is born). After this time you may choose to use a pacifier. Research has shown that pacifier use during the first year of a baby's life decreases the risk of sudden infant death syndrome (SIDS). Allow your baby to feed on each breast as long as he or she wants. Breastfeed until your baby is finished feeding. When your baby unlatches or falls asleep while feeding from the first breast, offer the second breast. Because newborns are often sleepy in the first few weeks of life, you may need to awaken your baby to get him or her to feed. Breastfeeding times will vary from baby to baby. However,  the following rules can serve as a guide to help you ensure that your baby is properly fed:  Newborns (babies 4 weeks of age or younger) may breastfeed every 1-3 hours.  Newborns should not go longer than 3 hours during the day or 5 hours during the night without breastfeeding.  You should breastfeed your baby a minimum of 8 times in a 24-hour period until you begin to introduce solid foods to your baby at around 6 months of age.  Breast milk pumping Pumping and storing breast milk allows you to ensure that your baby is exclusively fed your breast milk, even at times when you are unable to breastfeed. This is especially important if you are going back to work while you are still breastfeeding or when you are not able to be present during feedings. Your lactation consultant can give you guidelines on how long it is safe to store breast milk. A breast pump is a machine that allows you to pump milk from your breast into a sterile bottle. The pumped breast milk can then be stored in a refrigerator or freezer. Some breast pumps are operated by hand, while others use electricity. Ask your lactation consultant which type will work best for you. Breast pumps can be purchased, but some hospitals and breastfeeding support groups lease breast pumps on a monthly basis. A lactation consultant can teach you how to hand express breast milk, if you prefer not to use a pump. Caring for your breasts while you breastfeed Nipples can become dry, cracked, and sore while breastfeeding. The following recommendations can help keep your breasts moisturized and healthy:  Avoid using soap on your nipples.  Wear a supportive bra. Although not required, special nursing bras and tank tops are designed to allow access to your breasts for breastfeeding without taking off your entire bra or top. Avoid wearing underwire-style bras or extremely tight bras.  Air dry your nipples for 3-4minutes after each feeding.  Use only cotton  bra pads to absorb leaked breast milk. Leaking of breast milk between feedings is normal.  Use lanolin on your nipples after breastfeeding. Lanolin helps to maintain your skin's normal moisture barrier. If you use pure lanolin, you do not need to wash it off before feeding your baby again. Pure lanolin is not toxic to your baby. You may also hand express a few drops of breast milk and gently massage that milk into your nipples and allow the milk to air dry.  In the first few weeks after giving birth, some women experience extremely full breasts (engorgement). Engorgement can make your   breasts feel heavy, warm, and tender to the touch. Engorgement peaks within 3-5 days after you give birth. The following recommendations can help ease engorgement:  Completely empty your breasts while breastfeeding or pumping. You may want to start by applying warm, moist heat (in the shower or with warm water-soaked hand towels) just before feeding or pumping. This increases circulation and helps the milk flow. If your baby does not completely empty your breasts while breastfeeding, pump any extra milk after he or she is finished.  Wear a snug bra (nursing or regular) or tank top for 1-2 days to signal your body to slightly decrease milk production.  Apply ice packs to your breasts, unless this is too uncomfortable for you.  Make sure that your baby is latched on and positioned properly while breastfeeding.  If engorgement persists after 48 hours of following these recommendations, contact your health care provider or a lactation consultant. Overall health care recommendations while breastfeeding  Eat healthy foods. Alternate between meals and snacks, eating 3 of each per day. Because what you eat affects your breast milk, some of the foods may make your baby more irritable than usual. Avoid eating these foods if you are sure that they are negatively affecting your baby.  Drink milk, fruit juice, and water to  satisfy your thirst (about 10 glasses a day).  Rest often, relax, and continue to take your prenatal vitamins to prevent fatigue, stress, and anemia.  Continue breast self-awareness checks.  Avoid chewing and smoking tobacco. Chemicals from cigarettes that pass into breast milk and exposure to secondhand smoke may harm your baby.  Avoid alcohol and drug use, including marijuana. Some medicines that may be harmful to your baby can pass through breast milk. It is important to ask your health care provider before taking any medicine, including all over-the-counter and prescription medicine as well as vitamin and herbal supplements. It is possible to become pregnant while breastfeeding. If birth control is desired, ask your health care provider about options that will be safe for your baby. Contact a health care provider if:  You feel like you want to stop breastfeeding or have become frustrated with breastfeeding.  You have painful breasts or nipples.  Your nipples are cracked or bleeding.  Your breasts are red, tender, or warm.  You have a swollen area on either breast.  You have a fever or chills.  You have nausea or vomiting.  You have drainage other than breast milk from your nipples.  Your breasts do not become full before feedings by the fifth day after you give birth.  You feel sad and depressed.  Your baby is too sleepy to eat well.  Your baby is having trouble sleeping.  Your baby is wetting less than 3 diapers in a 24-hour period.  Your baby has less than 3 stools in a 24-hour period.  Your baby's skin or the white part of his or her eyes becomes yellow.  Your baby is not gaining weight by 5 days of age. Get help right away if:  Your baby is overly tired (lethargic) and does not want to wake up and feed.  Your baby develops an unexplained fever. This information is not intended to replace advice given to you by your health care provider. Make sure you discuss  any questions you have with your health care provider. Document Released: 07/14/2005 Document Revised: 12/26/2015 Document Reviewed: 01/05/2013 [employer] Interactive Patient Education  2017 [employer]  

## 2017-05-12 ENCOUNTER — Ambulatory Visit (INDEPENDENT_AMBULATORY_CARE_PROVIDER_SITE_OTHER): Payer: BLUE CROSS/BLUE SHIELD | Admitting: General Practice

## 2017-05-12 DIAGNOSIS — Z8751 Personal history of pre-term labor: Secondary | ICD-10-CM

## 2017-05-12 DIAGNOSIS — O09212 Supervision of pregnancy with history of pre-term labor, second trimester: Secondary | ICD-10-CM

## 2017-05-20 ENCOUNTER — Encounter: Payer: BLUE CROSS/BLUE SHIELD | Admitting: Obstetrics and Gynecology

## 2017-05-20 ENCOUNTER — Ambulatory Visit (INDEPENDENT_AMBULATORY_CARE_PROVIDER_SITE_OTHER): Payer: BLUE CROSS/BLUE SHIELD | Admitting: Advanced Practice Midwife

## 2017-05-20 VITALS — BP 119/69 | HR 99 | Wt 172.6 lb

## 2017-05-20 DIAGNOSIS — O0990 Supervision of high risk pregnancy, unspecified, unspecified trimester: Secondary | ICD-10-CM | POA: Diagnosis not present

## 2017-05-20 DIAGNOSIS — Z23 Encounter for immunization: Secondary | ICD-10-CM | POA: Diagnosis not present

## 2017-05-20 DIAGNOSIS — O09213 Supervision of pregnancy with history of pre-term labor, third trimester: Secondary | ICD-10-CM | POA: Diagnosis not present

## 2017-05-20 DIAGNOSIS — O0993 Supervision of high risk pregnancy, unspecified, third trimester: Secondary | ICD-10-CM

## 2017-05-20 NOTE — Patient Instructions (Signed)
Third Trimester of Pregnancy The third trimester is from week 28 through week 40 (months 7 through 9). The third trimester is a time when the unborn baby (fetus) is growing rapidly. At the end of the ninth month, the fetus is about 20 inches in length and weighs 6-10 pounds. Body changes during your third trimester Your body will continue to go through many changes during pregnancy. The changes vary from woman to woman. During the third trimester:  Your weight will continue to increase. You can expect to gain 25-35 pounds (11-16 kg) by the end of the pregnancy.  You may begin to get stretch marks on your hips, abdomen, and breasts.  You may urinate more often because the fetus is moving lower into your pelvis and pressing on your bladder.  You may develop or continue to have heartburn. This is caused by increased hormones that slow down muscles in the digestive tract.  You may develop or continue to have constipation because increased hormones slow digestion and cause the muscles that push waste through your intestines to relax.  You may develop hemorrhoids. These are swollen veins (varicose veins) in the rectum that can itch or be painful.  You may develop swollen, bulging veins (varicose veins) in your legs.  You may have increased body aches in the pelvis, back, or thighs. This is due to weight gain and increased hormones that are relaxing your joints.  You may have changes in your hair. These can include thickening of your hair, rapid growth, and changes in texture. Some women also have hair loss during or after pregnancy, or hair that feels dry or thin. Your hair will most likely return to normal after your baby is born.  Your breasts will continue to grow and they will continue to become tender. A yellow fluid (colostrum) may leak from your breasts. This is the first milk you are producing for your baby.  Your belly button may stick out.  You may notice more swelling in your hands,  face, or ankles.  You may have increased tingling or numbness in your hands, arms, and legs. The skin on your belly may also feel numb.  You may feel short of breath because of your expanding uterus.  You may have more problems sleeping. This can be caused by the size of your belly, increased need to urinate, and an increase in your body's metabolism.  You may notice the fetus "dropping," or moving lower in your abdomen (lightening).  You may have increased vaginal discharge.  You may notice your joints feel loose and you may have pain around your pelvic bone.  What to expect at prenatal visits You will have prenatal exams every 2 weeks until week 36. Then you will have weekly prenatal exams. During a routine prenatal visit:  You will be weighed to make sure you and the baby are growing normally.  Your blood pressure will be taken.  Your abdomen will be measured to track your baby's growth.  The fetal heartbeat will be listened to.  Any test results from the previous visit will be discussed.  You may have a cervical check near your due date to see if your cervix has softened or thinned (effaced).  You will be tested for Group B streptococcus. This happens between 35 and 37 weeks.  Your health care provider may ask you:  What your birth plan is.  How you are feeling.  If you are feeling the baby move.  If you have had   any abnormal symptoms, such as leaking fluid, bleeding, severe headaches, or abdominal cramping.  If you are using any tobacco products, including cigarettes, chewing tobacco, and electronic cigarettes.  If you have any questions.  Other tests or screenings that may be performed during your third trimester include:  Blood tests that check for low iron levels (anemia).  Fetal testing to check the health, activity level, and growth of the fetus. Testing is done if you have certain medical conditions or if there are problems during the  pregnancy.  Nonstress test (NST). This test checks the health of your baby to make sure there are no signs of problems, such as the baby not getting enough oxygen. During this test, a belt is placed around your belly. The baby is made to move, and its heart rate is monitored during movement.  What is false labor? False labor is a condition in which you feel small, irregular tightenings of the muscles in the womb (contractions) that usually go away with rest, changing position, or drinking water. These are called Braxton Hicks contractions. Contractions may last for hours, days, or even weeks before true labor sets in. If contractions come at regular intervals, become more frequent, increase in intensity, or become painful, you should see your health care provider. What are the signs of labor?  Abdominal cramps.  Regular contractions that start at 10 minutes apart and become stronger and more frequent with time.  Contractions that start on the top of the uterus and spread down to the lower abdomen and back.  Increased pelvic pressure and dull back pain.  A watery or bloody mucus discharge that comes from the vagina.  Leaking of amniotic fluid. This is also known as your "water breaking." It could be a slow trickle or a gush. Let your health care provider know if it has a color or strange odor. If you have any of these signs, call your health care provider right away, even if it is before your due date. Follow these instructions at home: Medicines  Follow your health care provider's instructions regarding medicine use. Specific medicines may be either safe or unsafe to take during pregnancy.  Take a prenatal vitamin that contains at least 600 micrograms (mcg) of folic acid.  If you develop constipation, try taking a stool softener if your health care provider approves. Eating and drinking  Eat a balanced diet that includes fresh fruits and vegetables, whole grains, good sources of protein  such as meat, eggs, or tofu, and low-fat dairy. Your health care provider will help you determine the amount of weight gain that is right for you.  Avoid raw meat and uncooked cheese. These carry germs that can cause birth defects in the baby.  If you have low calcium intake from food, talk to your health care provider about whether you should take a daily calcium supplement.  Eat four or five small meals rather than three large meals a day.  Limit foods that are high in fat and processed sugars, such as fried and sweet foods.  To prevent constipation: ? Drink enough fluid to keep your urine clear or pale yellow. ? Eat foods that are high in fiber, such as fresh fruits and vegetables, whole grains, and beans. Activity  Exercise only as directed by your health care provider. Most women can continue their usual exercise routine during pregnancy. Try to exercise for 30 minutes at least 5 days a week. Stop exercising if you experience uterine contractions.  Avoid heavy   lifting.  Do not exercise in extreme heat or humidity, or at high altitudes.  Wear low-heel, comfortable shoes.  Practice good posture.  You may continue to have sex unless your health care provider tells you otherwise. Relieving pain and discomfort  Take frequent breaks and rest with your legs elevated if you have leg cramps or low back pain.  Take warm sitz baths to soothe any pain or discomfort caused by hemorrhoids. Use hemorrhoid cream if your health care provider approves.  Wear a good support bra to prevent discomfort from breast tenderness.  If you develop varicose veins: ? Wear support pantyhose or compression stockings as told by your healthcare provider. ? Elevate your feet for 15 minutes, 3-4 times a day. Prenatal care  Write down your questions. Take them to your prenatal visits.  Keep all your prenatal visits as told by your health care provider. This is important. Safety  Wear your seat belt at  all times when driving.  Make a list of emergency phone numbers, including numbers for family, friends, the hospital, and police and fire departments. General instructions  Avoid cat litter boxes and soil used by cats. These carry germs that can cause birth defects in the baby. If you have a cat, ask someone to clean the litter box for you.  Do not travel far distances unless it is absolutely necessary and only with the approval of your health care provider.  Do not use hot tubs, steam rooms, or saunas.  Do not drink alcohol.  Do not use any products that contain nicotine or tobacco, such as cigarettes and e-cigarettes. If you need help quitting, ask your health care provider.  Do not use any medicinal herbs or unprescribed drugs. These chemicals affect the formation and growth of the baby.  Do not douche or use tampons or scented sanitary pads.  Do not cross your legs for long periods of time.  To prepare for the arrival of your baby: ? Take prenatal classes to understand, practice, and ask questions about labor and delivery. ? Make a trial run to the hospital. ? Visit the hospital and tour the maternity area. ? Arrange for maternity or paternity leave through employers. ? Arrange for family and friends to take care of pets while you are in the hospital. ? Purchase a rear-facing car seat and make sure you know how to install it in your car. ? Pack your hospital bag. ? Prepare the baby's nursery. Make sure to remove all pillows and stuffed animals from the baby's crib to prevent suffocation.  Visit your dentist if you have not gone during your pregnancy. Use a soft toothbrush to brush your teeth and be gentle when you floss. Contact a health care provider if:  You are unsure if you are in labor or if your water has broken.  You become dizzy.  You have mild pelvic cramps, pelvic pressure, or nagging pain in your abdominal area.  You have lower back pain.  You have persistent  nausea, vomiting, or diarrhea.  You have an unusual or bad smelling vaginal discharge.  You have pain when you urinate. Get help right away if:  Your water breaks before 37 weeks.  You have regular contractions less than 5 minutes apart before 37 weeks.  You have a fever.  You are leaking fluid from your vagina.  You have spotting or bleeding from your vagina.  You have severe abdominal pain or cramping.  You have rapid weight loss or weight gain.    You have shortness of breath with chest pain.  You notice sudden or extreme swelling of your face, hands, ankles, feet, or legs.  Your baby makes fewer than 10 movements in 2 hours.  You have severe headaches that do not go away when you take medicine.  You have vision changes. Summary  The third trimester is from week 28 through week 40, months 7 through 9. The third trimester is a time when the unborn baby (fetus) is growing rapidly.  During the third trimester, your discomfort may increase as you and your baby continue to gain weight. You may have abdominal, leg, and back pain, sleeping problems, and an increased need to urinate.  During the third trimester your breasts will keep growing and they will continue to become tender. A yellow fluid (colostrum) may leak from your breasts. This is the first milk you are producing for your baby.  False labor is a condition in which you feel small, irregular tightenings of the muscles in the womb (contractions) that eventually go away. These are called Braxton Hicks contractions. Contractions may last for hours, days, or even weeks before true labor sets in.  Signs of labor can include: abdominal cramps; regular contractions that start at 10 minutes apart and become stronger and more frequent with time; watery or bloody mucus discharge that comes from the vagina; increased pelvic pressure and dull back pain; and leaking of amniotic fluid. This information is not intended to replace advice  given to you by your health care provider. Make sure you discuss any questions you have with your health care provider. Document Released: 07/08/2001 Document Revised: 12/20/2015 Document Reviewed: 09/14/2012 Elsevier Interactive Patient Education  2017 [employer] Inc[employer]  

## 2017-05-20 NOTE — Progress Notes (Signed)
   PRENATAL VISIT NOTE  Subjective:  Natasha [name] is a 31 y.o. [phone] at [redacted]w[redacted]d being seen today for ongoing prenatal care.  She is currently monitored for the following issues for this high-risk pregnancy and has Post partum depression; History of preterm delivery; Supervision of high risk pregnancy, antepartum; and High risk pregnancy with low PAPPA (pregnancy-associated plasma protein A) on her problem list.  Patient reports no complaints.  Contractions: Not present. Vag. Bleeding: None, Small.  Movement: Present. Denies leaking of fluid.   The following portions of the patient's history were reviewed and updated as appropriate: allergies, current medications, past family history, past medical history, past social history, past surgical history and problem list. Problem list updated.  Objective:   Vitals:   05/20/17 0949  BP: 119/69  Pulse: 99  Weight: 172 lb 9.6 oz (78.3 kg)    Fetal Status: Fetal Heart Rate (bpm): 134   Movement: Present     FH: 28 cm  General:  Alert, oriented and cooperative. Patient is in no acute distress.  Skin: Skin is warm and dry. No rash noted.   Cardiovascular: Normal heart rate noted  Respiratory: Normal respiratory effort, no problems with respiration noted  Abdomen: Soft, gravid, appropriate for gestational age.  Pain/Pressure: Absent     Pelvic: Cervical exam deferred        Extremities: Normal range of motion.  Edema: Trace  Mental Status:  Normal mood and affect. Normal behavior. Normal judgment and thought content.   Assessment and Plan:  Pregnancy: [id number] at [redacted]w[redacted]d  1. Supervision of high risk pregnancy, antepartum  - Tdap vaccine greater than or equal to 7yo IM - Glucose Tolerance, 2 Hours w/1 Hour - Natasha [name] today   Preterm labor symptoms and general obstetric precautions including but not limited to vaginal bleeding, contractions, leaking of fluid and fetal movement were reviewed in detail with the patient. Please refer to After Visit  Summary for other counseling recommendations.  Return in about 2 weeks (around 06/03/2017).   Marcille Buffy, CNM

## 2017-05-20 NOTE — Addendum Note (Signed)
Addended by: Wendelyn Breslow L on: 05/20/2017 11:24 AM   Modules accepted: Orders

## 2017-05-20 NOTE — Addendum Note (Signed)
Addended by: Wendelyn Breslow L on: 05/20/2017 11:38 AM   Modules accepted: Orders

## 2017-05-21 LAB — CBC
HEMATOCRIT: 35.3 % (ref 34.0–46.6)
HEMOGLOBIN: 11.5 g/dL (ref 11.1–15.9)
MCH: 28.8 pg (ref 26.6–33.0)
MCHC: 32.6 g/dL (ref 31.5–35.7)
MCV: 89 fL (ref 79–97)
Platelets: 272 10*3/uL (ref 150–379)
RBC: 3.99 x10E6/uL (ref 3.77–5.28)
RDW: 15.2 % (ref 12.3–15.4)
WBC: 8.4 10*3/uL (ref 3.4–10.8)

## 2017-05-21 LAB — HIV ANTIBODY (ROUTINE TESTING W REFLEX): HIV SCREEN 4TH GENERATION: NONREACTIVE

## 2017-05-21 LAB — RPR: RPR Ser Ql: NONREACTIVE

## 2017-05-21 LAB — GLUCOSE TOLERANCE, 2 HOURS W/ 1HR
GLUCOSE, 2 HOUR: 133 mg/dL (ref 65–152)
Glucose, 1 hour: 209 mg/dL — ABNORMAL HIGH (ref 65–179)
Glucose, Fasting: 92 mg/dL — ABNORMAL HIGH (ref 65–91)

## 2017-05-26 ENCOUNTER — Ambulatory Visit (INDEPENDENT_AMBULATORY_CARE_PROVIDER_SITE_OTHER): Payer: BLUE CROSS/BLUE SHIELD | Admitting: *Deleted

## 2017-05-26 VITALS — BP 124/60 | HR 99 | Wt 172.9 lb

## 2017-05-26 DIAGNOSIS — O09213 Supervision of pregnancy with history of pre-term labor, third trimester: Secondary | ICD-10-CM

## 2017-05-26 DIAGNOSIS — Z8751 Personal history of pre-term labor: Secondary | ICD-10-CM

## 2017-05-26 NOTE — Progress Notes (Signed)
Makena 250 mg IM administered as scheduled.  Pt tolerated well.

## 2017-05-29 ENCOUNTER — Telehealth: Payer: Self-pay

## 2017-05-29 ENCOUNTER — Telehealth: Payer: Self-pay | Admitting: *Deleted

## 2017-05-29 DIAGNOSIS — O24410 Gestational diabetes mellitus in pregnancy, diet controlled: Secondary | ICD-10-CM

## 2017-05-29 NOTE — Telephone Encounter (Signed)
-----   Message from Tresea Mall, CNM sent at 05/25/2017 10:49 AM EDT ----- Patient has GDM please notify of her of results and set up to see diabetes educator.

## 2017-05-29 NOTE — Telephone Encounter (Signed)
Multiple calls placed to [facility] in order to obtain refill shipment of [place]. Information obtained during the calls revealed that the 38ml vials of medication are not currently available as well as the 73ml vials are also still not currently available. The pharmacist suggested that pt could switch to the auto injector or substitution with generic medication (hydroxyprogesterone caproate) may be possible depending on pt's insurance. Due to previous problems within this office with the auto injector, I provided verbal order to pharmacist Marye Round) for the generic form of [place]. She will process the request and return call if pt's insurance will not cover. Pt currently has 1 dose remaining.

## 2017-05-29 NOTE — Telephone Encounter (Signed)
LM for pt to please call the office in regards to results and scheduling an appt.

## 2017-06-01 MED ORDER — BAYER CONTOUR MONITOR W/DEVICE KIT: 1.0000 | PACK | Freq: Once | 0 refills | Status: AC

## 2017-06-01 MED ORDER — BAYER MICROLET LANCETS MISC: 12 refills | Status: DC

## 2017-06-01 MED ORDER — GLUCOSE BLOOD VI STRP: ORAL_STRIP | 12 refills | Status: DC

## 2017-06-01 NOTE — Telephone Encounter (Signed)
Scheduled patient diabetes education appt 11/8 @ 4pm. Called patient and informed her of results & appt. Also discussed with patient that we would go ahead and send in Rx for testing supplies for her to bring to her appt. Patient verbalized understanding to all & had no questions

## 2017-06-03 ENCOUNTER — Ambulatory Visit (INDEPENDENT_AMBULATORY_CARE_PROVIDER_SITE_OTHER): Payer: BLUE CROSS/BLUE SHIELD | Admitting: Family Medicine

## 2017-06-03 VITALS — BP 127/86 | HR 94 | Wt 171.0 lb

## 2017-06-03 DIAGNOSIS — O24410 Gestational diabetes mellitus in pregnancy, diet controlled: Secondary | ICD-10-CM

## 2017-06-03 DIAGNOSIS — O0990 Supervision of high risk pregnancy, unspecified, unspecified trimester: Secondary | ICD-10-CM

## 2017-06-03 DIAGNOSIS — Z8751 Personal history of pre-term labor: Secondary | ICD-10-CM

## 2017-06-03 DIAGNOSIS — O09899 Supervision of other high risk pregnancies, unspecified trimester: Secondary | ICD-10-CM

## 2017-06-03 DIAGNOSIS — O09213 Supervision of pregnancy with history of pre-term labor, third trimester: Secondary | ICD-10-CM

## 2017-06-03 DIAGNOSIS — O24419 Gestational diabetes mellitus in pregnancy, unspecified control: Secondary | ICD-10-CM | POA: Insufficient documentation

## 2017-06-03 DIAGNOSIS — O09893 Supervision of other high risk pregnancies, third trimester: Secondary | ICD-10-CM

## 2017-06-03 DIAGNOSIS — O0993 Supervision of high risk pregnancy, unspecified, third trimester: Secondary | ICD-10-CM

## 2017-06-03 DIAGNOSIS — O280 Abnormal hematological finding on antenatal screening of mother: Secondary | ICD-10-CM

## 2017-06-03 HISTORY — DX: Gestational diabetes mellitus in pregnancy, unspecified control: O24.419

## 2017-06-03 MED ORDER — ASPIRIN EC 81 MG PO TBEC: 81.0000 mg | DELAYED_RELEASE_TABLET | Freq: Every day | ORAL | 3 refills | Status: DC

## 2017-06-03 MED ORDER — GLUCOSE BLOOD VI STRP: ORAL_STRIP | 12 refills | Status: DC

## 2017-06-03 MED ORDER — ACCU-CHEK FASTCLIX LANCETS MISC: 1.0000 | Freq: Four times a day (QID) | 12 refills | Status: DC

## 2017-06-03 MED ORDER — ACCU-CHEK GUIDE W/DEVICE KIT: 1.0000 | PACK | Freq: Once | 0 refills | Status: AC

## 2017-06-03 MED ORDER — WRIST BRACE/LEFT SMALL MISC: 1.0000 "application " | Freq: Every day | 0 refills | Status: DC

## 2017-06-03 MED ORDER — WRIST BRACE/RIGHT SMALL MISC: 1.0000 | Freq: Every day | 0 refills | Status: DC

## 2017-06-03 NOTE — Progress Notes (Signed)
   PRENATAL VISIT NOTE  Subjective:  Natasha [name] is a 31 y.o. [phone] at [redacted]w[redacted]d being seen today for ongoing prenatal care.  She is currently monitored for the following issues for this high-risk pregnancy and has Post partum depression; History of preterm delivery; Supervision of high risk pregnancy, antepartum; and High risk pregnancy with low PAPPA (pregnancy-associated plasma protein A) on their problem list.  Patient reports no complaints.  Contractions: Not present. Vag. Bleeding: None.  Movement: Present. Denies leaking of fluid.   The following portions of the patient's history were reviewed and updated as appropriate: allergies, current medications, past family history, past medical history, past social history, past surgical history and problem list. Problem list updated.  Objective:   Vitals:   06/03/17 1311  BP: 127/86  Pulse: 94  Weight: 171 lb (77.6 kg)    Fetal Status: Fetal Heart Rate (bpm): 145 Fundal Height: 31 cm Movement: Present     General:  Alert, oriented and cooperative. Patient is in no acute distress.  Skin: Skin is warm and dry. No rash noted.   Cardiovascular: Normal heart rate noted  Respiratory: Normal respiratory effort, no problems with respiration noted  Abdomen: Soft, gravid, appropriate for gestational age.  Pain/Pressure: Present     Pelvic: Cervical exam deferred        Extremities: Normal range of motion.  Edema: None  Mental Status:  Normal mood and affect. Normal behavior. Normal judgment and thought content.   Assessment and Plan:  Pregnancy: [id number] at [redacted]w[redacted]d   1. History of preterm delivery Natasha [name] today. cotninue qweekly  2. Supervision of high risk pregnancy, antepartum  3. Gestational diabetes -failed 2 hour GTT -diabetes education tomorrow -given instructions for blood glucose monitoring -start aspirin 81 mg today  Preterm labor symptoms and general obstetric precautions including but not limited to vaginal bleeding,  contractions, leaking of fluid and fetal movement were reviewed in detail with the patient. Please refer to After Visit Summary for other counseling recommendations.  Return in about 2 weeks (around 06/17/2017).   Dannielle Huh, DO

## 2017-06-03 NOTE — Patient Instructions (Signed)
Preventing Preterm Birth Preterm birth is when your baby is delivered between 72 weeks and 37 weeks of pregnancy. A full-term pregnancy lasts for at least 37 weeks. Preterm birth can be dangerous for your baby because the last few weeks of pregnancy are an important time for your baby's brain and lungs to grow. Many things can cause a baby to be born early. Sometimes the cause is not known. There are certain factors that make you more likely to experience preterm birth, such as:  Having a previous baby born preterm.  Being pregnant with twins or other multiples.  Having had fertility treatment.  Being overweight or underweight at the start of your pregnancy.  Having any of the following during pregnancy: ? An infection, including a urinary tract infection (UTI) or an STI (sexually transmitted infection). ? High blood pressure. ? Diabetes. ? Vaginal bleeding.  Being age 33 or older.  Being age 50 or younger.  Getting pregnant within 6 months of a previous pregnancy.  Suffering extreme stress or physical or emotional abuse during pregnancy.  Standing for long periods of time during pregnancy, such as working at a job that requires standing.  What are the risks? The most serious risk of preterm birth is that the baby may not survive. This is more likely to happen if a baby is born before 13 weeks. Other risks and complications of preterm birth may include your baby having:  Breathing problems.  Brain damage that affects movement and coordination (cerebral palsy).  Feeding difficulties.  Vision or hearing problems.  Infections or inflammation of the digestive tract (colitis).  Developmental delays.  Learning disabilities.  Higher risk for diabetes, heart disease, and high blood pressure later in life.  What can I do to lower my risk? Medical care  The most important thing you can do to lower your risk for preterm birth is to get routine medical care during pregnancy  (prenatal care). If you have a high risk of preterm birth, you may be referred to a health care provider who specializes in managing high-risk pregnancies (perinatologist). You may be given medicine to help prevent preterm birth. Lifestyle changes Certain lifestyle changes can also lower your risk of preterm birth:  Wait at least 6 months after a pregnancy to become pregnant again.  Try to plan pregnancy for when you are between 57 and 77 years old.  Get to a healthy weight before getting pregnant. If you are overweight, work with your health care provider to safely lose weight.  Do not use any products that contain nicotine or tobacco, such as cigarettes and e-cigarettes. If you need help quitting, ask your health care provider.  Do not drink alcohol.  Do not use drugs.  Where to find support: For more support, consider:  Talking with your health care provider.  Talking with a therapist or substance abuse counselor, if you need help quitting.  Working with a diet and nutrition specialist (dietitian) or a Physiological scientist to maintain a healthy weight.  Joining a support group.  Where to find more information: Learn more about preventing preterm birth from:  Centers for Disease Control and Prevention: VoipObserver.com.br  March of Dimes: marchofdimes.org/complications/premature-babies.aspx  American Pregnancy Association: americanpregnancy.org/labor-and-birth/premature-labor  Contact a health care provider if:  You have any of the following signs of preterm labor before 37 weeks: ? A change or increase in vaginal discharge. ? Fluid leaking from your vagina. ? Pressure or cramps in your lower abdomen. ? A backache that does not  go away or gets worse. °? Regular tightening (contractions) in your lower abdomen. °Summary °· Preterm birth Test having your baby during weeks 20-37 of pregnancy. °· Preterm birth may put your baby at risk  for physical and mental problems. °· Getting good prenatal care can help prevent preterm birth. °· You can lower your risk of preterm birth by making certain lifestyle changes, such as not smoking and not using alcohol. °This information is not intended to replace advice given to you by your health care provider. Make sure you discuss any questions you have with your health care provider. °Document Released: 08/28/2015 Document Revised: 03/22/2016 Document Reviewed: 03/22/2016 °Elsevier Interactive Patient Education © 2018 [employer]vier Inc. ° °

## 2017-06-04 ENCOUNTER — Ambulatory Visit (HOSPITAL_COMMUNITY)
Admission: RE | Admit: 2017-06-04 | Discharge: 2017-06-04 | Disposition: A | Discharge status: Home / Self Care | Payer: BLUE CROSS/BLUE SHIELD | Source: Ambulatory Visit | Attending: Family Medicine | Admitting: Family Medicine

## 2017-06-04 ENCOUNTER — Ambulatory Visit: Payer: BLUE CROSS/BLUE SHIELD | Admitting: *Deleted

## 2017-06-04 ENCOUNTER — Encounter: Payer: BLUE CROSS/BLUE SHIELD | Attending: Obstetrics & Gynecology | Admitting: *Deleted

## 2017-06-04 DIAGNOSIS — Z3A00 Weeks of gestation of pregnancy not specified: Secondary | ICD-10-CM | POA: Insufficient documentation

## 2017-06-04 DIAGNOSIS — O24410 Gestational diabetes mellitus in pregnancy, diet controlled: Secondary | ICD-10-CM | POA: Insufficient documentation

## 2017-06-04 DIAGNOSIS — Z713 Dietary counseling and surveillance: Secondary | ICD-10-CM | POA: Diagnosis not present

## 2017-06-04 NOTE — Progress Notes (Signed)
  Patient was seen on 06/04/2017 for Gestational Diabetes self-management . Patient states no history of GDM. She brought her new Accu Chek meter to this visit as requested. Diet history obtained and reviewed. 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 The following learning objectives were met by the patient :   States the definition of Gestational Diabetes  States why dietary management is important in controlling blood glucose  Describes the effects of carbohydrates on blood glucose levels  Demonstrates ability to create a balanced meal plan  Demonstrates carbohydrate counting   States when to check blood glucose levels  Demonstrates proper blood glucose monitoring techniques  States the effect of stress and exercise on blood glucose levels  States the importance of limiting caffeine and abstaining from alcohol and smoking  Plan:  Aim for 3 Carb Choices per meal (45 grams) +/- 1 either way  Aim for 1-2 Carbs per snack Begin reading food labels for Total Carbohydrate of foods Consider  increasing your activity level by walking or other activity daily as tolerated Begin checking BG before breakfast and 2 hours after first bite of breakfast, lunch and dinner as directed by MD  Bring Log Book to every medical appointment   Take medication if directed by MD  Patient already has a meter: Accu Chek Guide Patient instructed on meter and to test pre breakfast and 2 hours each meal as directed by MD BG today was 79 mg/dl after eating lunch  I informed patient of Babyscripts, she prefers to write on Log Sheet for now but is aware she can sign up later.  Patient instructed to monitor glucose levels: FBS: 60 - 95 mg/dl 2 hour: <120 mg/dl  Patient received the following handouts:  Nutrition Diabetes and Pregnancy  Carbohydrate Counting List  BG Log Sheet  Patient will be seen for follow-up as needed.

## 2017-06-07 ENCOUNTER — Encounter (HOSPITAL_COMMUNITY): Payer: Self-pay | Admitting: Anesthesiology

## 2017-06-07 ENCOUNTER — Other Ambulatory Visit: Payer: Self-pay

## 2017-06-07 ENCOUNTER — Encounter (HOSPITAL_COMMUNITY): Payer: Self-pay

## 2017-06-07 ENCOUNTER — Inpatient Hospital Stay (HOSPITAL_COMMUNITY)
Admission: AD | Admit: 2017-06-07 | Discharge: 2017-06-10 | DRG: 805 | Disposition: A | Discharge status: Home / Self Care | Payer: BLUE CROSS/BLUE SHIELD | Source: Ambulatory Visit | Attending: Obstetrics & Gynecology | Admitting: Obstetrics & Gynecology

## 2017-06-07 DIAGNOSIS — Z3A31 31 weeks gestation of pregnancy: Secondary | ICD-10-CM

## 2017-06-07 DIAGNOSIS — O42913 Preterm premature rupture of membranes, unspecified as to length of time between rupture and onset of labor, third trimester: Principal | ICD-10-CM | POA: Diagnosis present

## 2017-06-07 DIAGNOSIS — O24420 Gestational diabetes mellitus in childbirth, diet controlled: Secondary | ICD-10-CM | POA: Diagnosis present

## 2017-06-07 DIAGNOSIS — R109 Unspecified abdominal pain: Secondary | ICD-10-CM | POA: Diagnosis not present

## 2017-06-07 LAB — WET PREP, GENITAL
SPERM: NONE SEEN
Trich, Wet Prep: NONE SEEN
Yeast Wet Prep HPF POC: NONE SEEN

## 2017-06-07 LAB — CBC
HEMATOCRIT: 37.2 % (ref 36.0–46.0)
HEMOGLOBIN: 12.1 g/dL (ref 12.0–15.0)
MCH: 28.8 pg (ref 26.0–34.0)
MCHC: 32.5 g/dL (ref 30.0–36.0)
MCV: 88.6 fL (ref 78.0–100.0)
PLATELETS: 258 10*3/uL (ref 150–400)
RBC: 4.2 MIL/uL (ref 3.87–5.11)
RDW: 15 % (ref 11.5–15.5)
WBC: 9.4 10*3/uL (ref 4.0–10.5)

## 2017-06-07 LAB — URINALYSIS, ROUTINE W REFLEX MICROSCOPIC
Bilirubin Urine: NEGATIVE
Glucose, UA: NEGATIVE mg/dL
KETONES UR: NEGATIVE mg/dL
Nitrite: NEGATIVE
PROTEIN: NEGATIVE mg/dL
Specific Gravity, Urine: 1.018 (ref 1.005–1.030)
pH: 7 (ref 5.0–8.0)

## 2017-06-07 LAB — TYPE AND SCREEN
ABO/RH(D): O POS
Antibody Screen: NEGATIVE

## 2017-06-07 LAB — GROUP B STREP BY PCR: GROUP B STREP BY PCR: NEGATIVE

## 2017-06-07 LAB — GLUCOSE, CAPILLARY: GLUCOSE-CAPILLARY: 113 mg/dL — AB (ref 65–99)

## 2017-06-07 MED ORDER — MAGNESIUM SULFATE 40 G IN LACTATED RINGERS - SIMPLE
2.0000 g/h | INTRAVENOUS | Status: DC
  Filled 2017-06-07: qty 500

## 2017-06-07 MED ORDER — TERBUTALINE SULFATE 1 MG/ML IJ SOLN
0.2500 mg | Freq: Once | INTRAMUSCULAR | Status: AC
  Administered 2017-06-07: 0.25 mg via SUBCUTANEOUS

## 2017-06-07 MED ORDER — ONDANSETRON HCL 4 MG/2ML IJ SOLN: 4.0000 mg | Freq: Four times a day (QID) | INTRAMUSCULAR | Status: DC | PRN

## 2017-06-07 MED ORDER — FLEET ENEMA 7-19 GM/118ML RE ENEM: 1.0000 | ENEMA | RECTAL | Status: DC | PRN

## 2017-06-07 MED ORDER — LACTATED RINGERS IV BOLUS (SEPSIS)
1000.0000 mL | Freq: Once | INTRAVENOUS | Status: AC
  Administered 2017-06-07: 1000 mL via INTRAVENOUS

## 2017-06-07 MED ORDER — OXYCODONE-ACETAMINOPHEN 5-325 MG PO TABS: 1.0000 | ORAL_TABLET | ORAL | Status: DC | PRN

## 2017-06-07 MED ORDER — PENICILLIN G POT IN DEXTROSE 60000 UNIT/ML IV SOLN
3.0000 10*6.[IU] | INTRAVENOUS | Status: DC
  Administered 2017-06-07: 3 10*6.[IU] via INTRAVENOUS
  Filled 2017-06-07 (×5): qty 50

## 2017-06-07 MED ORDER — LIDOCAINE HCL (PF) 1 % IJ SOLN
30.0000 mL | INTRAMUSCULAR | Status: DC | PRN
  Filled 2017-06-07: qty 30

## 2017-06-07 MED ORDER — FENTANYL CITRATE (PF) 100 MCG/2ML IJ SOLN: 50.0000 ug | INTRAMUSCULAR | Status: DC | PRN

## 2017-06-07 MED ORDER — LACTATED RINGERS IV SOLN
500.0000 mL | INTRAVENOUS | Status: DC | PRN
  Administered 2017-06-07: 200 mL via INTRAVENOUS

## 2017-06-07 MED ORDER — ACETAMINOPHEN 325 MG PO TABS: 650.0000 mg | ORAL_TABLET | ORAL | Status: DC | PRN

## 2017-06-07 MED ORDER — OXYTOCIN 40 UNITS IN LACTATED RINGERS INFUSION - SIMPLE MED
2.5000 [IU]/h | INTRAVENOUS | Status: DC
  Administered 2017-06-08: 2.5 [IU]/h via INTRAVENOUS
  Filled 2017-06-07: qty 1000

## 2017-06-07 MED ORDER — BETAMETHASONE SOD PHOS & ACET 6 (3-3) MG/ML IJ SUSP
12.0000 mg | Freq: Once | INTRAMUSCULAR | Status: AC
  Administered 2017-06-07: 12 mg via INTRAMUSCULAR
  Filled 2017-06-07: qty 2

## 2017-06-07 MED ORDER — PENICILLIN G POTASSIUM 5000000 UNITS IJ SOLR
5.0000 10*6.[IU] | Freq: Once | INTRAVENOUS | Status: AC
  Administered 2017-06-07: 5 10*6.[IU] via INTRAVENOUS
  Filled 2017-06-07: qty 5

## 2017-06-07 MED ORDER — MAGNESIUM SULFATE BOLUS VIA INFUSION
4.0000 g | Freq: Once | INTRAVENOUS | Status: AC
  Administered 2017-06-07: 4 g via INTRAVENOUS
  Filled 2017-06-07: qty 500

## 2017-06-07 MED ORDER — SOD CITRATE-CITRIC ACID 500-334 MG/5ML PO SOLN: 30.0000 mL | ORAL | Status: DC | PRN

## 2017-06-07 MED ORDER — TERBUTALINE SULFATE 1 MG/ML IJ SOLN
INTRAMUSCULAR | Status: AC
Start: 2017-06-07 — End: 2017-06-07
  Administered 2017-06-07: 0.25 mg via SUBCUTANEOUS
  Filled 2017-06-07: qty 1

## 2017-06-07 MED ORDER — LACTATED RINGERS IV SOLN: INTRAVENOUS | Status: DC

## 2017-06-07 MED ORDER — OXYTOCIN BOLUS FROM INFUSION
500.0000 mL | Freq: Once | INTRAVENOUS | Status: AC
  Administered 2017-06-08: 500 mL via INTRAVENOUS

## 2017-06-07 MED ORDER — OXYCODONE-ACETAMINOPHEN 5-325 MG PO TABS
2.0000 | ORAL_TABLET | ORAL | Status: DC | PRN
Start: 2017-06-07 — End: 2017-06-08

## 2017-06-07 NOTE — Progress Notes (Signed)
Evaluated patient at bedside for change in fetal heart baseline to 110 bpm. Patient had several contractions in a row. Given terbutaline x 2. Repositioned and IVF bolus. FSE placed. Cervix changing rapidly, now 6 cm. EFM: 110 bpm/mod var/late decels/pos decels. Continue to monitor closely. Will discuss with Dr. Elonda Husky.  Dannielle Huh, DO

## 2017-06-07 NOTE — Progress Notes (Signed)
Notified Dr. Manus Rudd of dropping baseline and difficulty distinguishing between mom and baby HR. Dr. Manus Rudd  No new orders at this time.

## 2017-06-07 NOTE — Progress Notes (Signed)
Provider remains at bedside reviewing strip.

## 2017-06-07 NOTE — Anesthesia Pain Management Evaluation Note (Signed)
  CRNA Pain Management Visit Note  Patient: Natasha [name], 31 y.o., female  "Hello I am a member of the anesthesia team at [facility]. We have an anesthesia team available at all times to provide care throughout the hospital, including epidural management and anesthesia for C-section. I don't know your plan for the delivery whether it a natural birth, water birth, IV sedation, nitrous supplementation, doula or epidural, but we want to meet your pain goals."   1.Was your pain managed to your expectations on prior hospitalizations?   Yes   2.What is your expectation for pain management during this hospitalization?     IV pain meds  3.How can we help you reach that goal? IV pain medication  Record the patient's initial score and the patient's pain goal.   Pain: 1  Pain Goal: 8 [facility] wants you to be able to say your pain was always managed very well.  Vick Filter 06/07/2017

## 2017-06-07 NOTE — Progress Notes (Signed)
Labor Progress Note Natasha [name] is a 31 y.o. [phone] at [redacted]w[redacted]d presented for preterm labor.SROM around 2045, clear fluid. S: No complaints, comfortable with contractions  O:  BP 121/63 (BP Location: Left Arm)   Pulse 96   Temp 98.4 F (36.9 C) (Oral)   Resp 16   Ht 5\' 1"  (1.549 m)   Wt 171 lb (77.6 kg)   LMP 11/01/2016 (Exact Date)   BMI 32.31 kg/m  EFM: 140 bpm/mod var/pos acels/no decels  CVE: Dilation: 4 Effacement (%): 90 Cervical Position: Middle Station: -2 Presentation: Vertex Exam by:: Ivee Poellnitz DO   A&P: 31 y.o. [id number] [redacted]w[redacted]d here for preterm labor #Labor: SROM at 2045, clear fluid. Continue magnesium until delivery. Received single dose betamethasone. Penicillin for GBS prophylaxis. Anticipate SVD. Cat 1 EFM.   Dannielle Huh, DO 8:59 PM

## 2017-06-07 NOTE — MAU Note (Signed)
Reports light bleeding since yesterday. Stopped and came back this morning. Also feeling lots of pressure. Feeling some intermittent pain that is sharp and shooting. No LOF, +FM

## 2017-06-07 NOTE — H&P (Signed)
   HPI: Natasha [name] is a 31 y.o. year old G30P0303 female at [redacted]w[redacted]d weeks gestation who presents to MAU reporting cramping and occasional sharp low abd/pelvic pain and small amount of VB since yesterday.  Associated Sx:  Vaginal bleeding: Light Leaking of fluid: ? episode of leaking clear fluid Fetal movement: nml  Hx PTD x 3. On 17-P. Gets care at [facility]. Dx GDM last week. Has been to DM educator, but not started testing.           OB History    Gravida Para Term Preterm AB Living   4 3 0 3   3   SAB TAB Ectopic Multiple Live Births           3      O:  Patient Vitals for the past 24 hrs:  BP Temp Temp src Pulse Resp  06/07/17 1711 131/77 98.6 F (37 C) Oral (!) 102 18    General: NAD, mild distress w/ UC's initially, but worsening. Heart: Regular rate Lungs: Normal rate and effort Abd: Soft, NT, Gravid, S=D Pelvic: NEFG, Neg pooling, small amount of bloody show and mucus. BBOW visible on spec exam.  Dilation: 3.5 Effacement (%): 90 Presentation: Vertex(based on bedside ultrasound) Exam by:: v smith cnm  EFM: 145, Moderate variability, 10x10 accelerations, mild variable decelerations Toco: Contractions every 2-5 minutes, moderate  A: [redacted]w[redacted]d week IUP Preterm labor w/ high risk of preterm delivery 2/2 Hx PTD FHR reactive VB C/W Nml bloody show. Low suspicion for bleeding from placental problem. New Dx GDM  P: Admit to L&D per consult w/ Florian Buff, MD  Report given to Daiva Nakayama.     Orders Placed This Encounter  Procedures  . Wet prep, genital  . Group B strep by PCR  . Urinalysis, Routine w reflex microscopic  . POCT fern test  . Insert peripheral IV      Meds ordered this encounter  Medications  . betamethasone acetate-betamethasone sodium phosphate (CELESTONE) injection 12 mg  . lactated ringers bolus 1,000 mL  . magnesium bolus via infusion 4 g  . magnesium sulfate 40 grams in LR 500 mL OB infusion  Will decide on blood sugar  testing and management after seeing if labor is progressing.

## 2017-06-07 NOTE — MAU Provider Note (Signed)
CC: No chief complaint on file.    First Provider Initiated Contact with Patient 06/07/17 1715      HPI: Natasha [name] is a 31 y.o. year old G61P0303 female at [redacted]w[redacted]d weeks gestation who presents to MAU reporting cramping and occasional sharp low abd/pelvic pain and small amount of VB since yesterday.  Associated Sx:  Vaginal bleeding: Light Leaking of fluid: ? episode of leaking clear fluid Fetal movement: nml  Hx PTD x 3. On 17-P. Gets care at [facility]. Dx GDM last week. Has been to DM educator, but not started testing.   OB History    Gravida Para Term Preterm AB Living   4 3 0 3   3   SAB TAB Ectopic Multiple Live Births           3      O:  Patient Vitals for the past 24 hrs:  BP Temp Temp src Pulse Resp  06/07/17 1711 131/77 98.6 F (37 C) Oral (!) 102 18    General: NAD, mild distress w/ UC's initially, but worsening. Heart: Regular rate Lungs: Normal rate and effort Abd: Soft, NT, Gravid, S=D Pelvic: NEFG, Neg pooling, small amount of bloody show and mucus. BBOW visible on spec exam.  Dilation: 3.5 Effacement (%): 90 Presentation: Vertex(based on bedside ultrasound) Exam by:: v Dwight Burdo cnm  EFM: 145, Moderate variability, 10x10 accelerations, mild variable decelerations Toco: Contractions every 2-5 minutes, moderate  A: [redacted]w[redacted]d week IUP Preterm labor w/ high risk of preterm delivery 2/2 Hx PTD FHR reactive VB C/W Nml bloody show. Low suspicion for bleeding from placental problem. New Dx GDM  P: Admit to L&D per consult w/ Florian Buff, MD  Report given to Daiva Nakayama.  Orders Placed This Encounter  Procedures  . Wet prep, genital  . Group B strep by PCR  . Urinalysis, Routine w reflex microscopic  . POCT fern test  . Insert peripheral IV   Meds ordered this encounter  Medications  . betamethasone acetate-betamethasone sodium phosphate (CELESTONE) injection 12 mg  . lactated ringers bolus 1,000 mL  . magnesium bolus via infusion 4 g  . magnesium  sulfate 40 grams in LR 500 mL OB infusion  Will decide on blood sugar testing and management after seeing if labor is progressing.  Tamala Julian, [place], [place] 06/07/2017 5:50 PM  3

## 2017-06-08 ENCOUNTER — Encounter (HOSPITAL_COMMUNITY): Payer: Self-pay

## 2017-06-08 ENCOUNTER — Other Ambulatory Visit: Payer: Self-pay

## 2017-06-08 DIAGNOSIS — Z3A31 31 weeks gestation of pregnancy: Secondary | ICD-10-CM

## 2017-06-08 LAB — GC/CHLAMYDIA PROBE AMP (~~LOC~~) NOT AT ARMC
Chlamydia: NEGATIVE
Neisseria Gonorrhea: NEGATIVE

## 2017-06-08 LAB — RPR: RPR: NONREACTIVE

## 2017-06-08 LAB — GLUCOSE, CAPILLARY
GLUCOSE-CAPILLARY: 191 mg/dL — AB (ref 65–99)
Glucose-Capillary: 187 mg/dL — ABNORMAL HIGH (ref 65–99)

## 2017-06-08 MED ORDER — COCONUT OIL OIL: 1.0000 "application " | TOPICAL_OIL | Status: DC | PRN

## 2017-06-08 MED ORDER — IBUPROFEN 600 MG PO TABS
600.0000 mg | ORAL_TABLET | Freq: Four times a day (QID) | ORAL | Status: DC
  Administered 2017-06-08 – 2017-06-09 (×6): 600 mg via ORAL
  Filled 2017-06-08 (×8): qty 1

## 2017-06-08 MED ORDER — WITCH HAZEL-GLYCERIN EX PADS: 1.0000 "application " | MEDICATED_PAD | CUTANEOUS | Status: DC | PRN

## 2017-06-08 MED ORDER — SIMETHICONE 80 MG PO CHEW: 80.0000 mg | CHEWABLE_TABLET | ORAL | Status: DC | PRN

## 2017-06-08 MED ORDER — ONDANSETRON HCL 4 MG/2ML IJ SOLN: 4.0000 mg | INTRAMUSCULAR | Status: DC | PRN

## 2017-06-08 MED ORDER — DIBUCAINE 1 % RE OINT: 1.0000 "application " | TOPICAL_OINTMENT | RECTAL | Status: DC | PRN

## 2017-06-08 MED ORDER — BENZOCAINE-MENTHOL 20-0.5 % EX AERO: 1.0000 "application " | INHALATION_SPRAY | CUTANEOUS | Status: DC | PRN

## 2017-06-08 MED ORDER — PRENATAL MULTIVITAMIN CH
1.0000 | ORAL_TABLET | Freq: Every day | ORAL | Status: DC
  Administered 2017-06-08: 1 via ORAL
  Filled 2017-06-08 (×2): qty 1

## 2017-06-08 MED ORDER — SENNOSIDES-DOCUSATE SODIUM 8.6-50 MG PO TABS
2.0000 | ORAL_TABLET | ORAL | Status: DC
  Administered 2017-06-08 – 2017-06-09 (×2): 2 via ORAL
  Filled 2017-06-08 (×2): qty 2

## 2017-06-08 MED ORDER — ACETAMINOPHEN 325 MG PO TABS: 650.0000 mg | ORAL_TABLET | ORAL | Status: DC | PRN

## 2017-06-08 MED ORDER — TETANUS-DIPHTH-ACELL PERTUSSIS 5-2.5-18.5 LF-MCG/0.5 IM SUSP: 0.5000 mL | Freq: Once | INTRAMUSCULAR | Status: DC

## 2017-06-08 MED ORDER — ONDANSETRON HCL 4 MG PO TABS: 4.0000 mg | ORAL_TABLET | ORAL | Status: DC | PRN

## 2017-06-08 MED ORDER — ZOLPIDEM TARTRATE 5 MG PO TABS: 5.0000 mg | ORAL_TABLET | Freq: Every evening | ORAL | Status: DC | PRN

## 2017-06-08 MED ORDER — DIPHENHYDRAMINE HCL 25 MG PO CAPS: 25.0000 mg | ORAL_CAPSULE | Freq: Four times a day (QID) | ORAL | Status: DC | PRN

## 2017-06-08 MED ORDER — TERBUTALINE SULFATE 1 MG/ML IJ SOLN
INTRAMUSCULAR | Status: AC
  Filled 2017-06-08: qty 1

## 2017-06-08 NOTE — Lactation Note (Signed)
This note was copied from a baby's chart. Lactation Consultation Note  Patient Name: Natasha [name] Today's Date: 06/08/2017 Reason for consult: Initial assessment;NICU baby  NICU baby 57 hours old. Terri, RN reports that she has offered to assist mom with use of DEBP, but mom has declined d/t being too busy with visiting baby etc. This LC offered to assist with pumping, but mom stated that she wants to shower first. Set the pump parts up and reviewed use and enc mom to call out when she is ready to pump. Discussed the importance of pumping early and often. Mom states that she did not BF older children, but her milk did "come in." Discussed assessment and interventions with Terri, Therapist, sports.    Maternal Data Has patient been taught Hand Expression?: Yes(Per mom.) Does the patient have breastfeeding experience prior to this delivery?: No  Feeding    LATCH Score                   Interventions Interventions: DEBP  Lactation Tools Discussed/Used Tools: Pump Breast pump type: Double-Electric Breast Pump   Consult Status Consult Status: Follow-up Date: 06/09/17 Follow-up type: In-patient    Andres Labrum 06/08/2017, 3:21 PM

## 2017-06-09 ENCOUNTER — Ambulatory Visit: Payer: BLUE CROSS/BLUE SHIELD

## 2017-06-09 NOTE — Progress Notes (Signed)
Faculty Attending Note  Post Partum Day 1  Subjective: Patient is feeling very well. She reports moderately well controlled pain on PO pain meds. She is ambulating,  passing flatus, tolerating a regular diet without nausea/vomiting. Denies light-headedness or dizziness. Bleeding is minimal. She is breast & bottle feeding. Baby is in NICU and doing well.   Objective: Blood pressure (!) 112/41, pulse 72, temperature 98.4 F (36.9 C), temperature source Oral, resp. rate 18, height 5\' 1"  (1.549 m), weight 171 lb (77.6 kg), last menstrual period 11/01/2016, SpO2 100 %, unknown if currently breastfeeding. Temp:  [98.4 F (36.9 C)-98.5 F (36.9 C)] 98.4 F (36.9 C) (11/13 0416) Pulse Rate:  [72-96] 72 (11/13 0416) Resp:  [17-18] 18 (11/13 0416) BP: (106-137)/(41-64) 112/41 (11/13 0416) SpO2:  [98 %-100 %] 100 % (11/13 0416)  Physical Exam:  General: alert, oriented, cooperative Chest: CTAB, normal respiratory effort Heart: RRR  Abdomen: +BS, soft, appropriately tender to palpation  Uterine Fundus: firm, 2 fingers below the umbilicus Lochia: moderate, rubra DVT Evaluation: no evidence of DVT Extremities: no edema, no calf tenderness   Current Facility-Administered Medications:  .  acetaminophen (TYLENOL) tablet 650 mg, 650 mg, Oral, Q4H PRN, Moss, Amber, DO .  benzocaine-Menthol (DERMOPLAST) 20-0.5 % topical spray 1 application, 1 application, Topical, PRN, Moss, Amber, DO .  coconut oil, 1 application, Topical, PRN, Moss, Amber, DO .  witch hazel-glycerin (TUCKS) pad 1 application, 1 application, Topical, PRN **AND** dibucaine (NUPERCAINAL) 1 % rectal ointment 1 application, 1 application, Rectal, PRN, Moss, Amber, DO .  diphenhydrAMINE (BENADRYL) capsule 25 mg, 25 mg, Oral, Q6H PRN, Moss, Amber, DO .  ibuprofen (ADVIL,MOTRIN) tablet 600 mg, 600 mg, Oral, Q6H, Moss, Amber, DO, 600 mg at 06/09/17 9357 .  ondansetron (ZOFRAN) tablet 4 mg, 4 mg, Oral, Q4H PRN **OR** ondansetron (ZOFRAN)  injection 4 mg, 4 mg, Intravenous, Q4H PRN, Moss, Amber, DO .  prenatal multivitamin tablet 1 tablet, 1 tablet, Oral, Q1200, Moss, Amber, DO, 1 tablet at 06/08/17 1108 .  senna-docusate (Senokot-S) tablet 2 tablet, 2 tablet, Oral, Q24H, Moss, Amber, DO, 2 tablet at 06/08/17 2327 .  simethicone (MYLICON) chewable tablet 80 mg, 80 mg, Oral, PRN, Moss, Amber, DO .  Tdap (BOOSTRIX) injection 0.5 mL, 0.5 mL, Intramuscular, Once, Moss, [employer], DO .  zolpidem (AMBIEN) tablet 5 mg, 5 mg, Oral, QHS PRN, Dannielle Huh, DO Recent Labs    06/07/17 1802  HGB 12.1  HCT 37.2    Assessment/Plan:  Patient is 31 y.o. [id number] PPD#1 s/p SVD at [redacted]w[redacted]d. She is doing very well, has been up to NICU extensively to see baby, recovering appropriately and complains only of minimal pain.   GDMA1 - am CBG 187  Continue routine post partum care Pain meds prn Regular diet Nexplanon in office for birth control Plan for discharge tomorrow   Sloan Leiter 06/09/2017, 2:13 PM

## 2017-06-09 NOTE — Plan of Care (Signed)
Patient is performing her own self care. Tolerating activity and resting appropriately. Discussed using the breast pump. Reminded that to increase production of her milk she should stimulate the breasts by pumping.

## 2017-06-09 NOTE — Lactation Note (Signed)
This note was copied from a baby's chart. Lactation Consultation Note  Patient Name: Natasha [name] Today's Date: 06/09/2017 Reason for consult: Follow-up assessment;Infant < 6lbs;Preterm <34wks;NICU baby   Follow up with mom of NICU infant. Mom reports she is not pumping and she has changed her mind and does not want to pump. Discussed how important breast milk is for NICU infant. Mom reports she has no questions/concerns at this time. She did not want/need LC assistance at this time.    Maternal Data    Feeding    LATCH Score                   Interventions    Lactation Tools Discussed/Used     Consult Status Consult Status: PRN Follow-up type: Call as needed    Donn Pierini 06/09/2017, 5:06 PM

## 2017-06-10 NOTE — Discharge Summary (Signed)
Physician Discharge Summary  Patient ID: Natasha [name] MRN: [id number] DOB/AGE: [birth year] 31 y.o.  Admit date: 06/07/2017 Discharge date: 06/10/2017   Discharge Diagnoses:  Active Problems:   Preterm labor   SVD (spontaneous vaginal delivery)    Hospital Course: Please see HPI dated 06/07/2017 for details. This is a 31 y.o. [id number] now PPD#2 from a spontaneous vaginal delivery with preterm labor at [redacted]w[redacted]d. Her antepartum course was complicated by PPROM and gDMA1. Sghe was also getting Makena for a hisory of pre-term delivery. Her post partum course was uncomplicated. By day 2, she was ambulating, passing flatus and pain was well controlled. She was discharged home HD#2 in good condition. Instructions for follow up given.    Physical exam  Vitals:   06/09/17 0416 06/09/17 1534 06/09/17 1937 06/10/17 0800  BP: (!) 112/41 (!) 92/53 115/75 121/75  Pulse: 72 90 88 76  Resp: 18 18 18 18   Temp: 98.4 F (36.9 C) 98 F (36.7 C) 97.9 F (36.6 C) 98.5 F (36.9 C)  TempSrc: Oral Oral Oral Oral  SpO2: 100% 96% 96% 100%  Weight:      Height:       General: alert, cooperative and no distress Lochia: appropriate Uterine Fundus: firm Incision: N/A DVT Evaluation: No evidence of DVT seen on physical exam. No significant calf/ankle edema. Labs: Lab Results  Component Value Date   WBC 9.4 06/07/2017   HGB 12.1 06/07/2017   HCT 37.2 06/07/2017   MCV 88.6 06/07/2017   PLT 258 06/07/2017   CMP Latest Ref Rng & Units 08/12/2015  Glucose 65 - 99 mg/dL 114(H)  BUN 6 - 20 mg/dL 13  Creatinine 0.44 - 1.00 mg/dL 0.91  Sodium 135 - 145 mmol/L 140  Potassium 3.5 - 5.1 mmol/L 4.1  Chloride 101 - 111 mmol/L 104  CO2 22 - 32 mmol/L 26  Calcium 8.9 - 10.3 mg/dL 9.6  Total Protein 6.5 - 8.1 g/dL 7.2  Total Bilirubin 0.3 - 1.2 mg/dL 0.3  Alkaline Phos 38 - 126 U/L 60  AST 15 - 41 U/L 25  ALT 14 - 54 U/L 28   Postpartum contraception: Nexplanon   Disposition: 01-Home or Self  Care  Discharged Condition: good  Discharge Instructions    Diet - low sodium heart healthy   Complete by:  As directed       Current Facility-Administered Medications:  .  acetaminophen (TYLENOL) tablet 650 mg, 650 mg, Oral, Q4H PRN, Moss, Amber, DO .  benzocaine-Menthol (DERMOPLAST) 20-0.5 % topical spray 1 application, 1 application, Topical, PRN, Moss, Amber, DO .  coconut oil, 1 application, Topical, PRN, Moss, Amber, DO .  witch hazel-glycerin (TUCKS) pad 1 application, 1 application, Topical, PRN **AND** dibucaine (NUPERCAINAL) 1 % rectal ointment 1 application, 1 application, Rectal, PRN, Moss, Amber, DO .  diphenhydrAMINE (BENADRYL) capsule 25 mg, 25 mg, Oral, Q6H PRN, Moss, Amber, DO .  ibuprofen (ADVIL,MOTRIN) tablet 600 mg, 600 mg, Oral, Q6H, Moss, Amber, DO, 600 mg at 06/09/17 2312 .  ondansetron (ZOFRAN) tablet 4 mg, 4 mg, Oral, Q4H PRN **OR** ondansetron (ZOFRAN) injection 4 mg, 4 mg, Intravenous, Q4H PRN, Moss, Amber, DO .  prenatal multivitamin tablet 1 tablet, 1 tablet, Oral, Q1200, Moss, Amber, DO, 1 tablet at 06/08/17 1108 .  senna-docusate (Senokot-S) tablet 2 tablet, 2 tablet, Oral, Q24H, Moss, Amber, DO, 2 tablet at 06/09/17 2312 .  simethicone (MYLICON) chewable tablet 80 mg, 80 mg, Oral, PRN, Moss, Amber, DO .  Tdap (BOOSTRIX) injection  0.5 mL, 0.5 mL, Intramuscular, Once, Moss, [employer], DO .  zolpidem (AMBIEN) tablet 5 mg, 5 mg, Oral, QHS PRN, Dannielle Huh, DO Follow-up [facility] [employer]. Schedule an appointment as soon as possible for a visit in 4 week(s).   Specialty:  Obstetrics and Gynecology Contact information: [place] [phone]          Signed: Sloan Leiter 06/10/2017, 12:01 PM

## 2017-06-10 NOTE — Discharge Instructions (Signed)
Postpartum Care After Vaginal Delivery °The period of time right after you deliver your newborn is called the postpartum period. °What kind of medical care will I receive? °· You may continue to receive fluids and medicines through an IV tube inserted into one of your veins. °· If an incision was made near your vagina (episiotomy) or if you had some vaginal tearing during delivery, cold compresses may be placed on your episiotomy or your tear. This helps to reduce pain and swelling. °· You may be given a squirt bottle to use when you go to the bathroom. You may use this until you are comfortable wiping as usual. To use the squirt bottle, follow these steps: °? Before you urinate, fill the squirt bottle with warm water. Do not use hot water. °? After you urinate, while you are sitting on the toilet, use the squirt bottle to rinse the area around your urethra and vaginal opening. This rinses away any urine and blood. °? You may do this instead of wiping. As you start healing, you may use the squirt bottle before wiping yourself. Make sure to wipe gently. °? Fill the squirt bottle with clean water every time you use the bathroom. °· You will be given sanitary pads to wear. °How can I expect to feel? °· You may not feel the need to urinate for several hours after delivery. °· You will have some soreness and pain in your abdomen and vagina. °· If you are breastfeeding, you may have uterine contractions every time you breastfeed for up to several weeks postpartum. Uterine contractions help your uterus return to its normal size. °· It is normal to have vaginal bleeding (lochia) after delivery. The amount and appearance of lochia is often similar to a menstrual period in the first week after delivery. It will gradually decrease over the next few weeks to a dry, yellow-brown discharge. For most women, lochia stops completely by 6-8 weeks after delivery. Vaginal bleeding can vary from woman to woman. °· Within the first few  days after delivery, you may have breast engorgement. This is when your breasts feel heavy, full, and uncomfortable. Your breasts may also throb and feel hard, tightly stretched, warm, and tender. After this occurs, you may have milk leaking from your breasts. Your health care provider can help you relieve discomfort due to breast engorgement. Breast engorgement should go away within a few days. °· You may feel more sad or worried than normal due to hormonal changes after delivery. These feelings should not last more than a few days. If these feelings do not go away after several days, speak with your health care provider. °How should I care for myself? °· Tell your health care provider if you have pain or discomfort. °· Drink enough water to keep your urine clear or pale yellow. °· Wash your hands thoroughly with soap and water for at least 20 seconds after changing your sanitary pads, after using the toilet, and before holding or feeding your baby. °· If you are not breastfeeding, avoid touching your breasts a lot. Doing this can make your breasts produce more milk. °· If you become weak or lightheaded, or you feel like you might faint, ask for help before: °? Getting out of bed. °? Showering. °· Change your sanitary pads frequently. Watch for any changes in your flow, such as a sudden increase in volume, a change in color, the passing of large blood clots. If you pass a blood clot from your vagina, save it   to show to your health care provider. Do not flush blood clots down the toilet without having your health care provider look at them. °· Make sure that all your vaccinations are up to date. This can help protect you and your baby from getting certain diseases. You may need to have immunizations done before you leave the hospital. °· If desired, talk with your health care provider about methods of family planning or birth control (contraception). °How can I start bonding with my baby? °Spending as much time as  possible with your baby is very important. During this time, you and your baby can get to know each other and develop a bond. Having your baby stay with you in your room (rooming in) can give you time to get to know your baby. Rooming in can also help you become comfortable caring for your baby. Breastfeeding can also help you bond with your baby. °How can I plan for returning home with my baby? °· Make sure that you have a car seat installed in your vehicle. °? Your car seat should be checked by a certified car seat installer to make sure that it is installed safely. °? Make sure that your baby fits into the car seat safely. °· Ask your health care provider any questions you have about caring for yourself or your baby. Make sure that you are able to contact your health care provider with any questions after leaving the hospital. °This information is not intended to replace advice given to you by your health care provider. Make sure you discuss any questions you have with your health care provider. °Document Released: 05/11/2007 Document Revised: 12/17/2015 Document Reviewed: 06/18/2015 °Elsevier Interactive Patient Education © 2018 [employer] ° °

## 2017-06-10 NOTE — Progress Notes (Signed)
Discharge isntructions reviewed with patient.  Discussed postpartum depression with patient.  Reviewed change in medications and follow up appointments.

## 2017-06-13 NOTE — Progress Notes (Signed)
Late Entry: CLINICAL SOCIAL WORK MATERNAL/CHILD NOTE  Patient Details  Name: Natasha [name] MRN: [id number] Date of Birth: 06/08/2019  Date:  06/13/2017  Clinical Social Worker Initiating Note:  Terri Piedra, LCSW Date/Time: Initiated:  06/09/17/1415     Child's Name:  Natasha [name]   Biological Parents:  Mother, Father(Guiliana Clinkscale and Letitia Neri)   Need for Interpreter:  None   Reason for Referral:  Parental Support of Premature Babies < 32 weeks/or Critically Ill babies   Address:  [address] [address]    Phone number:  [phone] (home)     Additional phone number:   Household Members/Support Persons (HM/SP):   Household Member/Support Person 1, Household Member/Support Person 2, Household Member/Support Person 3, Household Member/Support Person 4   HM/SP Name Relationship DOB or Age  HM/SP -1   daughter 77  HM/SP -2   son 15  HM/SP -3   daughter 65  HM/SP -71 James Seagraves  FOB    HM/SP -5        HM/SP -6        HM/SP -7        HM/SP -8          Natural Supports (not living in the home):  Extended Family, Immediate Family   Professional Supports: None   Employment:     Type of Work: CSW believes MOB said she works at [employer], but at times she was hard to hear.  She states she has 6 weeks off from work.  FOB works in Biomedical scientist.   Education:      Homebound arranged:    Financial Resources:  Medicaid, Multimedia programmer   Other Resources:      Cultural/Religious Considerations Which May Impact Care: None stated.  MOB's facesheet notes religion as [place].  Strengths:  Ability to meet basic needs , Compliance with medical plan , Home prepared for child , Understanding of illness, Pediatrician chosen   Psychotropic Medications:         Pediatrician:    [place]  Pediatrician List:   [facility] ([address])  [place]      Pediatrician Fax Number:    Risk Factors/Current Problems:  None   Cognitive State:  Able to Concentrate , Alert , Linear Thinking    Mood/Affect:  Euthymic , Calm , Relaxed    CSW Assessment: CSW met with parents in MOB's third floor room/304 to introduce services, offer support and complete assessment due to baby's admission to NICU at 31.2 weeks.  Parents were pleasant, but quiet, and seemed minimally interested in talking with CSW.  Therefore, conversation was brief.  Parents appear to have a good understanding of baby's medical situation, as MOB states her 33 year old daughter was born at 55 weeks and admitted to the NICU.  She states, "it sounds like my son will be in the NICU longer than she was," which she seems confused by.  CSW is unsure why she has come to this conclusion, but encouraged her to take things one day at a time and suggests that every baby is different and that Natasha [name] will dictate when his discharge will be.  CSW encouraged parents to remember his gestation and that although he is a "big" baby, that he will still need time and patience to progress and mature,  especially with feeding.  Parents both acknowledged this and state willingness to be patient with him.  FOB added, "as long as he is okay."   CSW informed parents of ongoing support services offered by NICU CSW and provided parents with contact information.  Parents state they have not completed preparations for baby at home, but state they have the [place] to do so.  They report having a good support system.  Parents report no questions, concerns or needs of CSW at this time.  CSW Plan/Description:  Perinatal Mood and Anxiety Disorder (PMADs) Education, Psychosocial Support and Ongoing Assessment of Needs    Alphonzo Cruise, [place] 06/09/2017, 2:15 PM

## 2017-06-15 ENCOUNTER — Encounter: Payer: BLUE CROSS/BLUE SHIELD | Admitting: Obstetrics and Gynecology

## 2017-07-08 ENCOUNTER — Encounter: Payer: Self-pay | Admitting: Family Medicine

## 2017-07-08 ENCOUNTER — Ambulatory Visit (INDEPENDENT_AMBULATORY_CARE_PROVIDER_SITE_OTHER): Payer: BLUE CROSS/BLUE SHIELD | Admitting: Family Medicine

## 2017-07-08 DIAGNOSIS — Z1389 Encounter for screening for other disorder: Secondary | ICD-10-CM

## 2017-07-08 DIAGNOSIS — O24439 Gestational diabetes mellitus in the puerperium, unspecified control: Secondary | ICD-10-CM | POA: Diagnosis not present

## 2017-07-08 LAB — POCT PREGNANCY, URINE: Preg Test, Ur: NEGATIVE

## 2017-07-08 NOTE — Progress Notes (Signed)
Subjective:     Natasha [name] is a 31 y.o. female who presents for a postpartum visit. She is 4 weeks postpartum following a spontaneous vaginal delivery. I have fully reviewed the prenatal and intrapartum course. The delivery was at 31 gestational weeks. Outcome: spontaneous vaginal delivery. Anesthesia: none. Postpartum course has been unremarkable. Baby's course has been remarkable for preterm delivery, currently in NICU. Baby is feeding by both breast and bottle - donor breast milk. Bleeding no bleeding. Bowel function is normal. Bladder function is normal. Patient is sexually active. Contraception method is none. Postpartum depression screening: negative.  The following portions of the patient's history were reviewed and updated as appropriate: allergies, current medications, past family history, past medical history, past social history, past surgical history and problem list.  Review of Systems Pertinent items are noted in HPI.   Objective:    BP 136/85   Pulse 84   Ht 5\' 1"  (1.549 m)   Wt 157 lb 3.2 oz (71.3 kg)   LMP 11/01/2016 (Exact Date)   Breastfeeding? Yes   BMI 29.70 kg/m   General:  alert, cooperative and no distress   HEENT:  [place]/AT, conjunctivae nonijnected  Lungs: Normal respiratory rate  Heart:  Normal heart rate  Abdomen: Soft, NT, ND   Skin:  warm, dry  Psych: Normal mood and affect  Neuro:  Alert, oriented, moving all 4 extremities  MSK: No LE edema        Assessment:     Normal postpartum exam. Pap smear not done at today's visit. Last pap 10/26/2015 normal  Plan:    1. Contraception: Nexplanon. She has scheduled visit to have this done next week as she had intercourse a week ago 2. GDM: 2-hr GT today 3. Follow up in: 1 year or as needed.    Almyra Free P. Degele, MD OB Fellow  Future Appointments  Date Time Provider [place]  07/15/2017 10:40 AM Anyanwu, Sallyanne Havers, MD [place]

## 2017-07-10 LAB — GLUCOSE TOLERANCE, 2 HOURS
GLUCOSE FASTING GTT: 103 mg/dL — AB (ref 65–99)
GLUCOSE, 2 HOUR: 111 mg/dL (ref 65–139)

## 2017-07-13 LAB — GLUCOSE TOLERANCE, 2 HOURS

## 2017-07-14 ENCOUNTER — Ambulatory Visit: Payer: Self-pay

## 2017-07-14 NOTE — Lactation Note (Signed)
This note was copied from a baby's chart. Lactation Consultation Note  Patient Name: Natasha [name] Today's Date: 07/14/2017   Attempted to consult with mom in the NICU per SLP request from yesterday. Mom was not at bedside and infant had just fed from a bottle by SLP. RN to call when mom is here and infant ready to feed. Follow up as needed.      Maternal Data    Feeding Feeding Type: Donor Breast Milk Nipple Type: Slow - flow Length of feed: 15 min  LATCH Score                   Interventions    Lactation Tools Discussed/Used     Consult Status      Debby Freiberg Gisselle Galvis 07/14/2017, 12:26 PM

## 2017-07-15 ENCOUNTER — Ambulatory Visit: Payer: BLUE CROSS/BLUE SHIELD | Admitting: Obstetrics & Gynecology

## 2017-07-15 ENCOUNTER — Ambulatory Visit: Payer: BLUE CROSS/BLUE SHIELD | Admitting: Family Medicine

## 2017-08-28 ENCOUNTER — Ambulatory Visit (HOSPITAL_COMMUNITY)
Admission: EM | Admit: 2017-08-28 | Discharge: 2017-08-28 | Disposition: A | Discharge status: Home / Self Care | Payer: BLUE CROSS/BLUE SHIELD | Attending: Internal Medicine | Admitting: Internal Medicine

## 2017-08-28 ENCOUNTER — Encounter (HOSPITAL_COMMUNITY): Payer: Self-pay | Admitting: Emergency Medicine

## 2017-08-28 DIAGNOSIS — R51 Headache: Secondary | ICD-10-CM

## 2017-08-28 DIAGNOSIS — R1084 Generalized abdominal pain: Secondary | ICD-10-CM

## 2017-08-28 DIAGNOSIS — R1110 Vomiting, unspecified: Secondary | ICD-10-CM

## 2017-08-28 MED ORDER — ONDANSETRON 4 MG PO TBDP
ORAL_TABLET | ORAL | Status: AC
  Filled 2017-08-28: qty 1

## 2017-08-28 MED ORDER — IBUPROFEN 100 MG/5ML PO SUSP
ORAL | Status: AC
  Filled 2017-08-28: qty 20

## 2017-08-28 MED ORDER — IBUPROFEN 800 MG PO TABS
400.0000 mg | ORAL_TABLET | Freq: Once | ORAL | Status: AC
  Administered 2017-08-28: 400 mg via ORAL

## 2017-08-28 MED ORDER — ONDANSETRON HCL 4 MG PO TABS: 4.0000 mg | ORAL_TABLET | Freq: Three times a day (TID) | ORAL | 0 refills | Status: AC | PRN

## 2017-08-28 MED ORDER — ONDANSETRON 4 MG PO TBDP
4.0000 mg | ORAL_TABLET | Freq: Once | ORAL | Status: AC
  Administered 2017-08-28: 4 mg via ORAL

## 2017-08-28 NOTE — ED Triage Notes (Signed)
PT C/O: vomiting onset yest associated w/rash on face, chills, abd pain  Sts it happened after eating at a [employer] yest  DENIES: fevers, diarrhea  TAKING MEDS: acetaminophen   A&O x4... NAD... Ambulatory

## 2017-08-28 NOTE — ED Provider Notes (Signed)
[facility]    CSN: [id number] Arrival date & time: 08/28/17  1410     History   Chief Complaint Chief Complaint  Patient presents with  . Emesis    HPI Natasha [name] is a 32 y.o. female significant past medical history presenting today with headache, abdominal pain and vomiting.  She states that after she ate [place] food yesterday was 20 minutes later she developed nausea and vomiting and broke out in hives onto her face.  Rash of face mildly itchy.  Has improved since yesterday.  Denies diarrhea.  Denies hematemesis.  She also had associated dizziness and chills.  Since this episode she has been able to take in soup and noodles and hold that down.  Drinking ginger ale.  Still has mild nausea sensation, but is no longer actively vomiting.  Has lower abdominal pain.  Denies urinary symptoms and vaginal symptoms.  Denies fever.  She has a son in the NICU and wanted to be checked to ensure she did not pass anything to him.   HPI  Past Medical History:  Diagnosis Date  . History of preterm delivery 01/19/2017   3 preterm deliveries Had Makena 2012 with del at 36 wks Agrees to [facility] this time    Patient Active Problem List   Diagnosis Date Noted  . SVD (spontaneous vaginal delivery) 06/08/2017  . Preterm labor 06/07/2017  . Gestational diabetes 06/03/2017  . High risk pregnancy with low PAPPA (pregnancy-associated plasma protein A) 03/16/2017  . Supervision of high risk pregnancy, antepartum 01/26/2017  . History of preterm delivery 01/19/2017  . 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 IUD REMOVAL    . LAPAROSCOPY  02/06/2011   Procedure: LAPAROSCOPY OPERATIVE;  Surgeon: Osborne Oman, MD;  Location: [facility];  Service: Gynecology;  Laterality: N/A;  Removal of Mirena IUD from sigmoid serosa, enterolysis, proctoscopy  . PROCTOSCOPY  02/06/2011   Procedure: PROCTOSCOPY;  Surgeon: Adin Hector, MD;  Location: [facility];   Service: General;  Laterality: N/A;    OB History    Gravida Para Term Preterm AB Living   4 4 0 4   4   SAB TAB Ectopic Multiple Live Births         0 4       Home Medications    Prior to Admission medications   Medication Sig Start Date End Date Taking? Authorizing Provider  acetaminophen (TYLENOL) 325 MG tablet Take 650 mg every 6 (six) hours as needed by mouth for headache.    [provider]  calcium carbonate (TUMS - DOSED IN MG ELEMENTAL CALCIUM) 500 MG chewable tablet Chew 1 tablet 2 (two) times daily as needed by mouth for indigestion or heartburn.    [provider]  Elastic Bandages & Supports (WRIST BRACE/LEFT SMALL) MISC 1 application at bedtime by Does not apply route. Patient not taking: Reported on 07/08/2017 06/03/17   Dannielle Huh, DO  Elastic Bandages & Supports (WRIST BRACE/RIGHT SMALL) MISC 1 applicator at bedtime by Does not apply route. Patient not taking: Reported on 07/08/2017 06/03/17   Dannielle Huh, DO  ondansetron (ZOFRAN) 4 MG tablet Take 1 tablet (4 mg total) by mouth every 8 (eight) hours as needed for up to 5 days for nausea or vomiting. 08/28/17 09/02/17  Wieters, Hallie C, PA-C  Prenatal Vit-Fe Fumarate-FA (PRENATAL VITAMIN PO) Take 1 tablet daily by mouth.     [provider]  Family History Family History  Problem Relation Age of Onset  . Hypertension Paternal Grandmother   . Hypertension Paternal Grandfather   . Hypertension Maternal Grandmother   . Hypertension Maternal Grandfather     Social History Social History   Tobacco Use  . Smoking status: Never Smoker  . Smokeless tobacco: Never Used  Substance Use Topics  . Alcohol use: No  . Drug use: No     Allergies   Patient has no known allergies.   Review of Systems Review of Systems  Constitutional: Positive for chills. Negative for fever.  Eyes: Negative for pain and visual disturbance.  Respiratory: Negative for cough and shortness of breath.     Cardiovascular: Negative for chest pain and palpitations.  Gastrointestinal: Positive for abdominal pain, nausea and vomiting. Negative for diarrhea.  Genitourinary: Negative for dysuria, hematuria and vaginal discharge.  Musculoskeletal: Negative for arthralgias and back pain.  Skin: Negative for color change and rash.  Neurological: Positive for dizziness and headaches. Negative for syncope and weakness.  All other systems reviewed and are negative.    Physical Exam Triage Vital Signs ED Triage Vitals  Enc Vitals Group     BP 08/28/17 1428 121/73     Pulse Rate 08/28/17 1428 80     Resp 08/28/17 1428 20     Temp 08/28/17 1428 98.9 F (37.2 C)     Temp Source 08/28/17 1428 Oral     SpO2 08/28/17 1428 100 %     Weight --      Height --      Head Circumference --      Peak Flow --      Pain Score 08/28/17 1429 7     Pain Loc --      Pain Edu? --      Excl. in [place]? --    No data found.  Updated Vital Signs BP 121/73 (BP Location: Left Arm)   Pulse 80   Temp 98.9 F (37.2 C) (Oral)   Resp 20   LMP 08/27/2017   SpO2 100%   Breastfeeding? No   Physical Exam  Constitutional: She is oriented to person, place, and time. She appears well-developed and well-nourished. No distress.  HENT:  Head: Normocephalic and atraumatic.  Eyes: Conjunctivae and EOM are normal. Pupils are equal, round, and reactive to light.  Neck: Neck supple.  Cardiovascular: Normal rate and regular rhythm.  No murmur heard. Pulmonary/Chest: Effort normal and breath sounds normal. No respiratory distress.  Abdominal: Soft. There is tenderness.  Bowel sounds present throughout abdomen.  Mild tenderness to palpation between the left upper and left lower quadrants.  Abdomen is soft, nonrigid, no rebound.  Negative Rovsing's.  Negative Murphy's, negative McBurney's.  Musculoskeletal: She exhibits no edema.  Neurological: She is alert and oriented to person, place, and time. No cranial nerve deficit.  Coordination normal.  Skin: Skin is warm and dry.  Mild erythema and small papules to right side of forehead/temporal region.  Psychiatric: She has a normal mood and affect.  Nursing note and vitals reviewed.    UC Treatments / Results  Labs (all labs ordered are listed, but only abnormal results are displayed) Labs Reviewed - No data to display  EKG  EKG Interpretation None       Radiology No results found.  Procedures Procedures (including critical care time)  Medications Ordered in UC Medications  ondansetron (ZOFRAN-ODT) disintegrating tablet 4 mg (4 mg Oral Given 08/28/17 1529)  ibuprofen (ADVIL,MOTRIN) tablet  400 mg (400 mg Oral Given 08/28/17 1529)     Initial Impression / Assessment and Plan / UC Course  I have reviewed the triage vital signs and the nursing notes.  Pertinent labs & imaging results that were available during my care of the patient were reviewed by me and considered in my medical decision making (see chart for details).    Patient likely with a viral gastroenteritis from food she ate yesterday, with reaction to skin.  Will provide Zofran to use as needed for nausea.  Continue doing encourage hydration and oral intake.  If her hives be the Zyrtec daily, Benadryl at night.  Rash of face seems to be minimal and improved from yesterday based on description.  Advised to continue Tylenol and ibuprofen for headache.  Offered headache injections, patient opted for the oral route. Discussed strict return precautions. Patient verbalized understanding and is agreeable with plan.   Final Clinical Impressions(s) / UC Diagnoses   Final diagnoses:  Generalized abdominal pain    ED Discharge Orders        Ordered    ondansetron (ZOFRAN) 4 MG tablet  Every 8 hours PRN     08/28/17 1523       Controlled Substance Prescriptions [place] Controlled Substance Registry consulted? Not Applicable   Janith Lima, [place] 08/28/17 1556

## 2017-08-28 NOTE — Discharge Instructions (Signed)
For your headache: Use anti-inflammatories. You may take up to 800 mg Ibuprofen every 8 hours with food. You may supplement Ibuprofen with Tylenol [id number] mg every 8 hours.   If you develop any vision changes, one-sided weakness, difficulty speaking, facial droop, chest pain, shortness of breath please go to emergency room immediately.  For nausea/abdominal pain: Please use Zofran as needed to help with nausea.  I expect this to slowly resolve.  In the meantime please try to stay hydrated.  If you have any worsening abdominal pain, develop uncontrolled vomiting, unable to take in foods by mouth, develop lightheadedness or dizziness please go to emergency room.  For Hives: I expect this to slowly go away as the food is out of your system.  May take a couple of days.  In the meantime you may take a daily Zyrtec.  You may use Benadryl at night for any irritation.  Please return if symptoms worsening, affecting vision.

## 2017-12-03 ENCOUNTER — Encounter (HOSPITAL_COMMUNITY): Payer: Self-pay | Admitting: Emergency Medicine

## 2017-12-03 ENCOUNTER — Ambulatory Visit (HOSPITAL_COMMUNITY)
Admission: EM | Admit: 2017-12-03 | Discharge: 2017-12-03 | Disposition: A | Discharge status: Home / Self Care | Payer: BLUE CROSS/BLUE SHIELD | Attending: Family Medicine | Admitting: Family Medicine

## 2017-12-03 DIAGNOSIS — L03811 Cellulitis of head [any part, except face]: Secondary | ICD-10-CM | POA: Diagnosis not present

## 2017-12-03 DIAGNOSIS — R21 Rash and other nonspecific skin eruption: Secondary | ICD-10-CM

## 2017-12-03 DIAGNOSIS — L243 Irritant contact dermatitis due to cosmetics: Secondary | ICD-10-CM

## 2017-12-03 DIAGNOSIS — J029 Acute pharyngitis, unspecified: Secondary | ICD-10-CM

## 2017-12-03 MED ORDER — PREDNISONE 20 MG PO TABS: 40.0000 mg | ORAL_TABLET | Freq: Every day | ORAL | 0 refills | Status: AC

## 2017-12-03 MED ORDER — AMOXICILLIN-POT CLAVULANATE 875-125 MG PO TABS: 1.0000 | ORAL_TABLET | Freq: Two times a day (BID) | ORAL | 0 refills | Status: AC

## 2017-12-03 NOTE — ED Provider Notes (Signed)
Natasha [name]    CSN: [id number] Arrival date & time: 12/03/17  1623     History   Chief Complaint Chief Complaint  Patient presents with  . Rash  . Sore Throat    HPI Natasha [name] is a 32 y.o. female.   Natasha [name] presents with complaints of rash to bilateral ears which she noticed approximately 4 days ago. She states she got her hair dyed and felt burning to her ears while she was getting shampoo'd. Rash developed the following day. She has noticed drainage from the rash. It is painful. Has been applying cerevue cream (OTC) to the area which has not helped. Denies any previous similar. Some itching.   Also complaints of mild sore throat. This started two days ago and has been improving. States she has been around a few ill contacts, baby at home with cold symptoms. No fevers, runny nose, cough. Has been taking otc sore throat treatments which have been helping. Without contributing medical history.     ROS per HPI.      Past Medical History:  Diagnosis Date  . History of preterm delivery 01/19/2017   3 preterm deliveries Had Makena 2012 with del at 36 wks Agrees to [facility] this time    Patient Active Problem List   Diagnosis Date Noted  . SVD (spontaneous vaginal delivery) 06/08/2017  . Preterm labor 06/07/2017  . Gestational diabetes 06/03/2017  . High risk pregnancy with low PAPPA (pregnancy-associated plasma protein A) 03/16/2017  . Supervision of high risk pregnancy, antepartum 01/26/2017  . History of preterm delivery 01/19/2017  . 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 IUD REMOVAL    . LAPAROSCOPY  02/06/2011   Procedure: LAPAROSCOPY OPERATIVE;  Surgeon: Osborne Oman, MD;  Location: [facility];  Service: Gynecology;  Laterality: N/A;  Removal of Mirena IUD from sigmoid serosa, enterolysis, proctoscopy  . PROCTOSCOPY  02/06/2011   Procedure: PROCTOSCOPY;  Surgeon: Adin Hector, MD;  Location: [facility];   Service: General;  Laterality: N/A;    OB History    Gravida  4   Para  4   Term  0   Preterm  4   AB      Living  4     SAB      TAB      Ectopic      Multiple  0   Live Births  4            Home Medications    Prior to Admission medications   Medication Sig Start Date End Date Taking? Authorizing Provider  acetaminophen (TYLENOL) 325 MG tablet Take 650 mg every 6 (six) hours as needed by mouth for headache.    [provider]  amoxicillin-clavulanate (AUGMENTIN) 875-125 MG tablet Take 1 tablet by mouth every 12 (twelve) hours for 7 days. 12/03/17 12/10/17  Zigmund Gottron, NP  calcium carbonate (TUMS - DOSED IN MG ELEMENTAL CALCIUM) 500 MG chewable tablet Chew 1 tablet 2 (two) times daily as needed by mouth for indigestion or heartburn.    [provider]  Elastic Bandages & Supports (WRIST BRACE/LEFT SMALL) MISC 1 application at bedtime by Does not apply route. Patient not taking: Reported on 07/08/2017 06/03/17   Dannielle Huh, DO  Elastic Bandages & Supports (WRIST BRACE/RIGHT SMALL) MISC 1 applicator at bedtime by Does not apply route. Patient not taking: Reported on 07/08/2017 06/03/17   Dannielle Huh, DO  predniSONE (DELTASONE) 20 MG tablet Take 2 tablets (40 mg total) by mouth daily with breakfast for 2 days. 12/03/17 12/05/17  Zigmund Gottron, NP  Prenatal Vit-Fe Fumarate-FA (PRENATAL VITAMIN PO) Take 1 tablet daily by mouth.     [provider]    Family History Family History  Problem Relation Age of Onset  . Hypertension Paternal Grandmother   . Hypertension Paternal Grandfather   . Hypertension Maternal Grandmother   . Hypertension Maternal Grandfather     Social History Social History   Tobacco Use  . Smoking status: Never Smoker  . Smokeless tobacco: Never Used  Substance Use Topics  . Alcohol use: No  . Drug use: No     Allergies   Patient has no known allergies.   Review of Systems Review of  Systems   Physical Exam Triage Vital Signs ED Triage Vitals [12/03/17 1719]  Enc Vitals Group     BP 130/90     Pulse Rate 100     Resp 18     Temp 98.8 F (37.1 C)     Temp src      SpO2 100 %     Weight      Height      Head Circumference      Peak Flow      Pain Score      Pain Loc      Pain Edu?      Excl. in [place]?    No data found.  Updated Vital Signs BP 130/90   Pulse 100   Temp 98.8 F (37.1 C)   Resp 18   LMP 11/19/2017   SpO2 100%   Physical Exam  Constitutional: She is oriented to person, place, and time. She appears well-developed and well-nourished. No distress.  HENT:  Head: Normocephalic and atraumatic.  Right Ear: Tympanic membrane, external ear and ear canal normal.  Left Ear: Tympanic membrane, external ear and ear canal normal.  Nose: Nose normal.  Mouth/Throat: Uvula is midline, oropharynx is clear and moist and mucous membranes are normal. Tonsils are 1+ on the right. Tonsils are 1+ on the left. No tonsillar exudate.  Eyes: Pupils are equal, round, and reactive to light. Conjunctivae and EOM are normal.  Cardiovascular: Normal rate, regular rhythm and normal heart sounds.  Pulmonary/Chest: Effort normal and breath sounds normal.  Lymphadenopathy:    She has no cervical adenopathy.  Neurological: She is alert and oriented to person, place, and time.  Skin: Skin is warm and dry. Rash noted.  Bilateral ears with fissures to pinna as well as fissues to posterior ear with crusting yellow drainage; flaky dry plaquing underlying which extends into hairline; see photo of left ear- right appears the same; canal WNL          UC Treatments / Results  Labs (all labs ordered are listed, but only abnormal results are displayed) Labs Reviewed - No data to display  EKG None  Radiology No results found.  Procedures Procedures (including critical care time)  Medications Ordered in UC Medications - No data to display  Initial Impression /  Assessment and Plan / UC Course  I have reviewed the triage vital signs and the nursing notes.  Pertinent labs & imaging results that were available during my care of the patient were reviewed by me and considered in my medical decision making (see chart for details).     Contact dermatitis with overlying infection present. 2 days of prednisone and  1 week augmentin. Return precautions provided. Patient verbalized understanding and agreeable to plan.    Final Clinical Impressions(s) / UC Diagnoses   Final diagnoses:  Irritant contact dermatitis due to cosmetics  Cellulitis of head except face     Discharge Instructions     Two days of prednisone, may also take antihistamine to help with itching, such as zyrtec.  Complete course of antibiotics. Try to avoid touching to keep clean. Wash with soap and water daily. If symptoms worsen or do not improve in the next week to return to be seen or to follow up with your PCP.      ED Prescriptions    Medication Sig Dispense Auth. Provider   predniSONE (DELTASONE) 20 MG tablet Take 2 tablets (40 mg total) by mouth daily with breakfast for 2 days. 4 tablet Augusto Gamble B, NP   amoxicillin-clavulanate (AUGMENTIN) 875-125 MG tablet Take 1 tablet by mouth every 12 (twelve) hours for 7 days. 14 tablet Zigmund Gottron, NP     Controlled Substance Prescriptions [place] Controlled Substance Registry consulted? Not Applicable   Zigmund Gottron, NP 12/03/17 [phone]

## 2017-12-03 NOTE — Discharge Instructions (Signed)
Two days of prednisone, may also take antihistamine to help with itching, such as zyrtec.  Complete course of antibiotics. Try to avoid touching to keep clean. Wash with soap and water daily. If symptoms worsen or do not improve in the next week to return to be seen or to follow up with your PCP.

## 2017-12-03 NOTE — ED Triage Notes (Signed)
Pt c/o sore throat for a few days, also states on 5/4 she dyed her hair and broke out in a rash on bilateral ears. NO fevers.

## 2019-05-31 DIAGNOSIS — Z3046 Encounter for surveillance of implantable subdermal contraceptive: Secondary | ICD-10-CM | POA: Diagnosis not present

## 2019-05-31 DIAGNOSIS — Z01419 Encounter for gynecological examination (general) (routine) without abnormal findings: Secondary | ICD-10-CM | POA: Diagnosis not present

## 2019-05-31 DIAGNOSIS — N760 Acute vaginitis: Secondary | ICD-10-CM | POA: Diagnosis not present

## 2019-05-31 DIAGNOSIS — N6322 Unspecified lump in the left breast, upper inner quadrant: Secondary | ICD-10-CM | POA: Diagnosis not present

## 2019-08-30 ENCOUNTER — Other Ambulatory Visit: Payer: Self-pay | Admitting: Family

## 2019-08-30 DIAGNOSIS — N6459 Other signs and symptoms in breast: Secondary | ICD-10-CM

## 2019-08-30 DIAGNOSIS — N6320 Unspecified lump in the left breast, unspecified quadrant: Secondary | ICD-10-CM

## 2019-09-06 ENCOUNTER — Other Ambulatory Visit: Payer: Self-pay

## 2019-09-06 ENCOUNTER — Ambulatory Visit
Admission: RE | Admit: 2019-09-06 | Discharge: 2019-09-06 | Disposition: A | Discharge status: Home / Self Care | Payer: BLUE CROSS/BLUE SHIELD | Source: Ambulatory Visit | Attending: Family | Admitting: Family

## 2019-09-06 ENCOUNTER — Other Ambulatory Visit: Payer: Self-pay | Admitting: Family

## 2019-09-06 ENCOUNTER — Ambulatory Visit
Admission: RE | Admit: 2019-09-06 | Discharge: 2019-09-06 | Disposition: A | Discharge status: Home / Self Care | Payer: BC Managed Care – PPO | Source: Ambulatory Visit | Attending: Family | Admitting: Family

## 2019-09-06 DIAGNOSIS — N6320 Unspecified lump in the left breast, unspecified quadrant: Secondary | ICD-10-CM

## 2019-09-06 DIAGNOSIS — N6452 Nipple discharge: Secondary | ICD-10-CM | POA: Diagnosis not present

## 2019-09-06 DIAGNOSIS — N6459 Other signs and symptoms in breast: Secondary | ICD-10-CM

## 2019-09-06 DIAGNOSIS — R922 Inconclusive mammogram: Secondary | ICD-10-CM | POA: Diagnosis not present

## 2019-09-08 ENCOUNTER — Other Ambulatory Visit: Payer: Self-pay | Admitting: Family

## 2019-09-08 DIAGNOSIS — N6459 Other signs and symptoms in breast: Secondary | ICD-10-CM

## 2019-09-16 DIAGNOSIS — N6452 Nipple discharge: Secondary | ICD-10-CM | POA: Diagnosis not present

## 2019-09-16 DIAGNOSIS — N6459 Other signs and symptoms in breast: Secondary | ICD-10-CM | POA: Diagnosis not present

## 2019-09-16 DIAGNOSIS — R922 Inconclusive mammogram: Secondary | ICD-10-CM | POA: Diagnosis not present

## 2019-09-22 ENCOUNTER — Other Ambulatory Visit: Payer: Self-pay

## 2019-09-22 ENCOUNTER — Encounter (HOSPITAL_COMMUNITY): Payer: Self-pay

## 2019-09-22 ENCOUNTER — Ambulatory Visit (HOSPITAL_COMMUNITY)
Admission: EM | Admit: 2019-09-22 | Discharge: 2019-09-22 | Disposition: A | Discharge status: Home / Self Care | Payer: BC Managed Care – PPO | Attending: Internal Medicine | Admitting: Internal Medicine

## 2019-09-22 DIAGNOSIS — R300 Dysuria: Secondary | ICD-10-CM | POA: Diagnosis not present

## 2019-09-22 DIAGNOSIS — N390 Urinary tract infection, site not specified: Secondary | ICD-10-CM | POA: Insufficient documentation

## 2019-09-22 DIAGNOSIS — I10 Essential (primary) hypertension: Secondary | ICD-10-CM | POA: Insufficient documentation

## 2019-09-22 DIAGNOSIS — N760 Acute vaginitis: Secondary | ICD-10-CM | POA: Diagnosis not present

## 2019-09-22 DIAGNOSIS — Z113 Encounter for screening for infections with a predominantly sexual mode of transmission: Secondary | ICD-10-CM | POA: Diagnosis not present

## 2019-09-22 HISTORY — DX: Essential (primary) hypertension: I10

## 2019-09-22 LAB — POCT URINALYSIS DIP (DEVICE)
Bilirubin Urine: NEGATIVE
Glucose, UA: NEGATIVE mg/dL
Ketones, ur: NEGATIVE mg/dL
Nitrite: NEGATIVE
Protein, ur: 30 mg/dL — AB
Specific Gravity, Urine: >=1.03 (ref 1.005–1.030)
Urobilinogen, UA: 0.2 mg/dL (ref 0.0–1.0)
pH: 6 (ref 5.0–8.0)

## 2019-09-22 MED ORDER — CEPHALEXIN 500 MG PO CAPS: 500.0000 mg | ORAL_CAPSULE | Freq: Two times a day (BID) | ORAL | 0 refills | Status: AC

## 2019-09-22 NOTE — ED Triage Notes (Signed)
Pt c/o burning with urination, vaginal irritation x2 days. Denies abd pain, n/v, fever, chills.

## 2019-09-22 NOTE — ED Provider Notes (Signed)
[place]    CSN: [id number] Arrival date & time: 09/22/19  1052      History   Chief Complaint Chief Complaint  Patient presents with  . Dysuria  . Vaginitis    HPI Natasha [name] is a 34 y.o. female comes to urgent care with complaints of dysuria, urgency or frequency as well as vaginal irritation of 2 days duration.  Patient changed her pantiliners just recently and said the vaginal irritation started after she started using the new pantiliners.  She has since stopped using the pantiliners but the symptoms have persisted.  She has some dysuria, urgency or frequency but without nausea or vomiting.  Patient denies fever or chills.  No vaginal discharge.  No dyspareunia. Patient has a new sexual partner and she has been engaging in unprotected sexual intercourse.  She would like to be screened for STD. HPI  Past Medical History:  Diagnosis Date  . History of preterm delivery 01/19/2017   3 preterm deliveries Had Makena 2012 with del at 36 wks Agrees to [facility] this time  . Hypertension     Patient Active Problem List   Diagnosis Date Noted  . SVD (spontaneous vaginal delivery) 06/08/2017  . Preterm labor 06/07/2017  . Gestational diabetes 06/03/2017  . High risk pregnancy with low PAPPA (pregnancy-associated plasma protein A) 03/16/2017  . Supervision of high risk pregnancy, antepartum 01/26/2017  . History of preterm delivery 01/19/2017  . 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 IUD REMOVAL    . LAPAROSCOPY  02/06/2011   Procedure: LAPAROSCOPY OPERATIVE;  Surgeon: Osborne Oman, MD;  Location: [facility];  Service: Gynecology;  Laterality: N/A;  Removal of Mirena IUD from sigmoid serosa, enterolysis, proctoscopy  . PROCTOSCOPY  02/06/2011   Procedure: PROCTOSCOPY;  Surgeon: Adin Hector, MD;  Location: [facility];  Service: General;  Laterality: N/A;    OB History    Gravida  4   Para  4   Term  0   Preterm  4   AB      Living  4     SAB      TAB      Ectopic      Multiple  0   Live Births  4            Home Medications    Prior to Admission medications   Medication Sig Start Date End Date Taking? Authorizing Provider  cephALEXin (KEFLEX) 500 MG capsule Take 1 capsule (500 mg total) by mouth 2 (two) times daily for 5 days. 09/22/19 09/27/19  Chase Picket, MD    Family History Family History  Problem Relation Age of Onset  . Hypertension Paternal Grandmother   . Hypertension Paternal Grandfather   . Hypertension Maternal Grandmother   . Hypertension Maternal Grandfather     Social History Social History   Tobacco Use  . Smoking status: Never Smoker  . Smokeless tobacco: Never Used  Substance Use Topics  . Alcohol use: No  . Drug use: No     Allergies   Patient has no known allergies.   Review of Systems Review of Systems  Constitutional: Negative for activity change, diaphoresis, fatigue and fever.  Gastrointestinal: Negative for abdominal pain, nausea and vomiting.  Genitourinary: Positive for dysuria, frequency and vaginal pain. Negative for dyspareunia, urgency and vaginal discharge.  Musculoskeletal: Negative for arthralgias, joint swelling and myalgias.  Skin: Negative for rash.  Neurological: Negative for dizziness, numbness and headaches.     Physical Exam Triage Vital Signs ED Triage Vitals  Enc Vitals Group     BP 09/22/19 1144 (!) 134/91     Pulse Rate 09/22/19 1144 83     Resp 09/22/19 1144 18     Temp 09/22/19 1144 98.3 F (36.8 C)     Temp Source 09/22/19 1144 Oral     SpO2 09/22/19 1144 98 %     Weight --      Height --      Head Circumference --      Peak Flow --      Pain Score 09/22/19 1146 5     Pain Loc --      Pain Edu? --      Excl. in [place]? --    No data found.  Updated Vital Signs BP (!) 134/91 (BP Location: Right Arm)   Pulse 83   Temp 98.3 F (36.8 C) (Oral)   Resp 18   LMP 09/17/2019   SpO2  98%   Visual Acuity Right Eye Distance:   Left Eye Distance:   Bilateral Distance:    Right Eye Near:   Left Eye Near:    Bilateral Near:     Physical Exam Vitals and nursing note reviewed.  Constitutional:      General: She is not in acute distress.    Appearance: She is not ill-appearing.  Cardiovascular:     Rate and Rhythm: Normal rate and regular rhythm.  Pulmonary:     Effort: Pulmonary effort is normal. No respiratory distress.     Breath sounds: Normal breath sounds. No rhonchi or rales.  Abdominal:     General: Bowel sounds are normal. There is no distension.     Hernia: No hernia is present.  Musculoskeletal:        General: No swelling or signs of injury. Normal range of motion.     Right lower leg: No edema.  Skin:    General: Skin is warm.     Capillary Refill: Capillary refill takes less than 2 seconds.     Coloration: Skin is not jaundiced.     Findings: No erythema.  Neurological:     General: No focal deficit present.     Mental Status: She is alert and oriented to person, place, and time.      UC Treatments / Results  Labs (all labs ordered are listed, but only abnormal results are displayed) Labs Reviewed  POCT URINALYSIS DIP (DEVICE) - Abnormal; Notable for the following components:      Result Value   Hgb urine dipstick SMALL (*)    Protein, ur 30 (*)    Leukocytes,Ua TRACE (*)    All other components within normal limits  CERVICOVAGINAL ANCILLARY ONLY    EKG   Radiology No results found.  Procedures Procedures (including critical care time)  Medications Ordered in UC Medications - No data to display  Initial Impression / Assessment and Plan / UC Course  I have reviewed the triage vital signs and the nursing notes.  Pertinent labs & imaging results that were available during my care of the patient were reviewed by me and considered in my medical decision making (see chart for details).     1.  Acute vaginitis likely allergic  contact related: Patient has discontinued pantiliner use Cervicovaginal swab for GC/chlamydia/trichomonas/bacterial vaginosis/yeast If swab results are remarkable, patient's plan of care will be updated to reflect  the results.   2.  Urinary tract infection: Keflex 500 mg twice daily for 5 days Point-of-care urinalysis is positive for hemoglobin, trace leukocyte esterase Urine culture was not sent. Return precautions given. Final Clinical Impressions(s) / UC Diagnoses   Final diagnoses:  Lower urinary tract infectious disease   Discharge Instructions   None    ED Prescriptions    Medication Sig Dispense Auth. Provider   cephALEXin (KEFLEX) 500 MG capsule Take 1 capsule (500 mg total) by mouth 2 (two) times daily for 5 days. 20 capsule Cobi Aldape, Myrene Galas, MD     PDMP not reviewed this encounter.   Chase Picket, MD 09/22/19 [phone]

## 2019-09-26 ENCOUNTER — Telehealth (HOSPITAL_COMMUNITY): Payer: Self-pay | Admitting: Emergency Medicine

## 2019-09-26 ENCOUNTER — Encounter (HOSPITAL_COMMUNITY): Payer: Self-pay

## 2019-09-26 LAB — CERVICOVAGINAL ANCILLARY ONLY
Bacterial vaginitis: POSITIVE — AB
Candida vaginitis: POSITIVE — AB
Chlamydia: NEGATIVE
Neisseria Gonorrhea: NEGATIVE
Trichomonas: POSITIVE — AB

## 2019-09-26 MED ORDER — FLUCONAZOLE 150 MG PO TABS: 150.0000 mg | ORAL_TABLET | Freq: Once | ORAL | 0 refills | Status: AC

## 2019-09-26 MED ORDER — METRONIDAZOLE 500 MG PO TABS: 500.0000 mg | ORAL_TABLET | Freq: Two times a day (BID) | ORAL | 0 refills | Status: DC

## 2019-09-26 NOTE — Telephone Encounter (Signed)
Bacterial vaginosis is positive. Pt needs treatment. Flagyl 500 mg BID x 7 days #14 no refills sent to patients pharmacy of choice.    Trichomonas is positive. Rx  for Flagyl was sent to the pharmacy of record. Pt needs education to refrain from sexual intercourse for 7 days to give the medicine time to work. Sexual partners need to be notified and tested/treated. Condoms may reduce risk of reinfection. Recheck for further evaluation if symptoms are not improving.   Test for candida (yeast) was positive.  Prescription for fluconazole 150mg  po now, repeat dose in 3d if needed, #2 no refills, sent to the pharmacy of record.  Recheck or followup with PCP for further evaluation if symptoms are not improving.    Attempted to reach patient. No answer at this time. Voicemail left.   If you have any questions, you may call me at [phone]

## 2019-09-26 NOTE — Telephone Encounter (Signed)
Patient contacted by phone and made aware of    results. Pt verbalized understanding and had all questions answered.

## 2019-09-30 ENCOUNTER — Other Ambulatory Visit: Payer: Self-pay

## 2019-09-30 ENCOUNTER — Encounter (HOSPITAL_COMMUNITY): Payer: Self-pay | Admitting: Emergency Medicine

## 2019-09-30 ENCOUNTER — Emergency Department (HOSPITAL_COMMUNITY)
Admission: EM | Admit: 2019-09-30 | Discharge: 2019-09-30 | Disposition: A | Discharge status: Home / Self Care | Payer: BC Managed Care – PPO | Attending: Emergency Medicine | Admitting: Emergency Medicine

## 2019-09-30 DIAGNOSIS — R4182 Altered mental status, unspecified: Secondary | ICD-10-CM | POA: Insufficient documentation

## 2019-09-30 LAB — CBC WITH DIFFERENTIAL/PLATELET
Abs Immature Granulocytes: 0.02 10*3/uL (ref 0.00–0.07)
Basophils Absolute: 0 10*3/uL (ref 0.0–0.1)
Basophils Relative: 0 %
Eosinophils Absolute: 0.1 10*3/uL (ref 0.0–0.5)
Eosinophils Relative: 1 %
HCT: 39.1 % (ref 36.0–46.0)
Hemoglobin: 12.4 g/dL (ref 12.0–15.0)
Immature Granulocytes: 0 %
Lymphocytes Relative: 44 %
Lymphs Abs: 3.1 10*3/uL (ref 0.7–4.0)
MCH: 30.1 pg (ref 26.0–34.0)
MCHC: 31.7 g/dL (ref 30.0–36.0)
MCV: 94.9 fL (ref 80.0–100.0)
Monocytes Absolute: 0.6 10*3/uL (ref 0.1–1.0)
Monocytes Relative: 9 %
Neutro Abs: 3.3 10*3/uL (ref 1.7–7.7)
Neutrophils Relative %: 46 %
Platelets: 315 10*3/uL (ref 150–400)
RBC: 4.12 MIL/uL (ref 3.87–5.11)
RDW: 13.6 % (ref 11.5–15.5)
WBC: 7.2 10*3/uL (ref 4.0–10.5)
nRBC: 0 % (ref 0.0–0.2)

## 2019-09-30 LAB — URINALYSIS, ROUTINE W REFLEX MICROSCOPIC
Bacteria, UA: NONE SEEN
Bilirubin Urine: NEGATIVE
Glucose, UA: NEGATIVE mg/dL
Ketones, ur: NEGATIVE mg/dL
Nitrite: NEGATIVE
Protein, ur: NEGATIVE mg/dL
Specific Gravity, Urine: 1.011 (ref 1.005–1.030)
pH: 6 (ref 5.0–8.0)

## 2019-09-30 LAB — COMPREHENSIVE METABOLIC PANEL
ALT: 16 U/L (ref 0–44)
AST: 15 U/L (ref 15–41)
Albumin: 4.3 g/dL (ref 3.5–5.0)
Alkaline Phosphatase: 53 U/L (ref 38–126)
Anion gap: 11 (ref 5–15)
BUN: 9 mg/dL (ref 6–20)
CO2: 23 mmol/L (ref 22–32)
Calcium: 9.5 mg/dL (ref 8.9–10.3)
Chloride: 104 mmol/L (ref 98–111)
Creatinine, Ser: 0.68 mg/dL (ref 0.44–1.00)
GFR calc Af Amer: >60 mL/min (ref >60)
GFR calc non Af Amer: >60 mL/min (ref >60)
Glucose, Bld: 102 mg/dL — ABNORMAL HIGH (ref 70–99)
Potassium: 3.8 mmol/L (ref 3.5–5.1)
Sodium: 138 mmol/L (ref 135–145)
Total Bilirubin: 0.6 mg/dL (ref 0.3–1.2)
Total Protein: 7.2 g/dL (ref 6.5–8.1)

## 2019-09-30 LAB — RAPID URINE DRUG SCREEN, HOSP PERFORMED
Amphetamines: NOT DETECTED
Barbiturates: NOT DETECTED
Benzodiazepines: NOT DETECTED
Cocaine: NOT DETECTED
Opiates: NOT DETECTED
Tetrahydrocannabinol: NOT DETECTED

## 2019-09-30 LAB — ETHANOL: Alcohol, Ethyl (B): <10 mg/dL (ref <10)

## 2019-09-30 LAB — I-STAT BETA HCG BLOOD, ED (MC, WL, AP ONLY): I-stat hCG, quantitative: <5 m[IU]/mL (ref <5)

## 2019-09-30 NOTE — ED Notes (Signed)
Ambulated patient to bathroom with no gait abnormalities or balance problems.

## 2019-09-30 NOTE — ED Provider Notes (Signed)
[facility] EMERGENCY DEPARTMENT Provider Note   CSN: [id number] Arrival date & time: 09/30/19  1919     History Chief Complaint  Patient presents with  . Altered Mental Status    Natasha [name] is a 34 y.o. female.  34 year old female presents with altered status.  Patient states that she took a dose of Adderall.  Denies that this was a suicide attempt.  Patient initially felt "Strange afterwards and now feels much better.  Denies any headache.  She is had no emesis.  Does not have any discomfort.  Denies any alcohol use.  No treatment use prior to arrival        Past Medical History:  Diagnosis Date  . History of preterm delivery 01/19/2017   3 preterm deliveries Had Makena 2012 with del at 36 wks Agrees to [facility] this time  . Hypertension     Patient Active Problem List   Diagnosis Date Noted  . SVD (spontaneous vaginal delivery) 06/08/2017  . Preterm labor 06/07/2017  . Gestational diabetes 06/03/2017  . High risk pregnancy with low PAPPA (pregnancy-associated plasma protein A) 03/16/2017  . Supervision of high risk pregnancy, antepartum 01/26/2017  . History of preterm delivery 01/19/2017  . 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 IUD REMOVAL    . LAPAROSCOPY  02/06/2011   Procedure: LAPAROSCOPY OPERATIVE;  Surgeon: Osborne Oman, MD;  Location: [facility];  Service: Gynecology;  Laterality: N/A;  Removal of Mirena IUD from sigmoid serosa, enterolysis, proctoscopy  . PROCTOSCOPY  02/06/2011   Procedure: PROCTOSCOPY;  Surgeon: Adin Hector, MD;  Location: [facility];  Service: General;  Laterality: N/A;     OB History    Gravida  4   Para  4   Term  0   Preterm  4   AB      Living  4     SAB      TAB      Ectopic      Multiple  0   Live Births  4           Family History  Problem Relation Age of Onset  . Hypertension Paternal Grandmother   . Hypertension Paternal  Grandfather   . Hypertension Maternal Grandmother   . Hypertension Maternal Grandfather     Social History   Tobacco Use  . Smoking status: Never Smoker  . Smokeless tobacco: Never Used  Substance Use Topics  . Alcohol use: No  . Drug use: No    Home Medications Prior to Admission medications   Medication Sig Start Date End Date Taking? Authorizing Provider  metroNIDAZOLE (FLAGYL) 500 MG tablet Take 1 tablet (500 mg total) by mouth 2 (two) times daily for 7 days. 09/26/19 10/03/19  Raylene Everts, MD    Allergies    Patient has no known allergies.  Review of Systems   Review of Systems  All other systems reviewed and are negative.   Physical Exam Updated Vital Signs BP (!) 142/89 (BP Location: Right Arm)   Pulse 81   Temp 97.6 F (36.4 C) (Oral)   Resp 16   Ht 1.549 m (5\' 1" )   Wt 71.3 kg   LMP 09/17/2019   SpO2 95%   BMI 29.70 kg/m   Physical Exam Vitals and nursing note reviewed.  Constitutional:      General: She is not in acute distress.    Appearance: Normal  appearance. She is well-developed. She is not toxic-appearing.  HENT:     Head: Normocephalic and atraumatic.  Eyes:     General: Lids are normal.     Conjunctiva/sclera: Conjunctivae normal.     Pupils: Pupils are equal, round, and reactive to light.  Neck:     Thyroid: No thyroid mass.     Trachea: No tracheal deviation.  Cardiovascular:     Rate and Rhythm: Normal rate and regular rhythm.     Heart sounds: Normal heart sounds. No murmur. No gallop.   Pulmonary:     Effort: Pulmonary effort is normal. No respiratory distress.     Breath sounds: Normal breath sounds. No stridor. No decreased breath sounds, wheezing, rhonchi or rales.  Abdominal:     General: Bowel sounds are normal. There is no distension.     Palpations: Abdomen is soft.     Tenderness: There is no abdominal tenderness. There is no rebound.  Musculoskeletal:        General: No tenderness. Normal range of motion.      Cervical back: Normal range of motion and neck supple.  Skin:    General: Skin is warm and dry.     Findings: No abrasion or rash.  Neurological:     Mental Status: She is alert and oriented to person, place, and time.     GCS: GCS eye subscore is 4. GCS verbal subscore is 5. GCS motor subscore is 6.     Cranial Nerves: No cranial nerve deficit.     Sensory: No sensory deficit.     Motor: Motor function is intact.     Coordination: Coordination is intact.     Comments: Strength is 5 of 5  Psychiatric:        Attention and Perception: Attention normal.        Speech: Speech normal.        Behavior: Behavior normal.     ED Results / Procedures / Treatments   Labs (all labs ordered are listed, but only abnormal results are displayed) Labs Reviewed  COMPREHENSIVE METABOLIC PANEL - Abnormal; Notable for the following components:      Result Value   Glucose, Bld 102 (*)    All other components within normal limits  URINALYSIS, ROUTINE W REFLEX MICROSCOPIC - Abnormal; Notable for the following components:   Hgb urine dipstick SMALL (*)    Leukocytes,Ua TRACE (*)    All other components within normal limits  CBC WITH DIFFERENTIAL/PLATELET  RAPID URINE DRUG SCREEN, HOSP PERFORMED  ETHANOL  I-STAT BETA HCG BLOOD, ED ([facility], [facility], [facility] ONLY)    EKG None  Radiology No results found.  Procedures Procedures (including critical care time)  Medications Ordered in ED Medications - No data to display  ED Course  I have reviewed the triage vital signs and the nursing notes.  Pertinent labs & imaging results that were available during my care of the patient were reviewed by me and considered in my medical decision making (see chart for details).    MDM Rules/Calculators/A&P                      Patient monitored here and has been appropriate.  She has been alert and oriented x4.  Spoke with patient's mother who states that patient seemed to be confused today.  I did question about  substance abuse and patient states that she did take some substances on Saturday which is several days ago.  Labs are reassuring here including negative UDS and EtOH.  Neurological assessment stable.  Will discharge home Final Clinical Impression(s) / ED Diagnoses Final diagnoses:  None    Rx / DC Orders ED Discharge Orders    None       Lacretia Leigh, MD 09/30/19 2213

## 2019-09-30 NOTE — ED Triage Notes (Signed)
Pt brought to ED by daughter for confusion, pt is by her self on triage, poor historian unable to state what is wrong with her. Pt is AO to her self unable to say where she is or what is going on.

## 2019-10-01 ENCOUNTER — Emergency Department (HOSPITAL_COMMUNITY): Payer: BC Managed Care – PPO

## 2019-10-01 ENCOUNTER — Other Ambulatory Visit: Payer: Self-pay

## 2019-10-01 ENCOUNTER — Encounter (HOSPITAL_COMMUNITY): Payer: Self-pay | Admitting: Emergency Medicine

## 2019-10-01 ENCOUNTER — Emergency Department (HOSPITAL_COMMUNITY)
Admission: EM | Admit: 2019-10-01 | Discharge: 2019-10-01 | Disposition: A | Discharge status: Home / Self Care | Payer: BC Managed Care – PPO | Attending: Emergency Medicine | Admitting: Emergency Medicine

## 2019-10-01 DIAGNOSIS — R410 Disorientation, unspecified: Secondary | ICD-10-CM | POA: Insufficient documentation

## 2019-10-01 DIAGNOSIS — I10 Essential (primary) hypertension: Secondary | ICD-10-CM | POA: Insufficient documentation

## 2019-10-01 DIAGNOSIS — R4182 Altered mental status, unspecified: Secondary | ICD-10-CM | POA: Diagnosis not present

## 2019-10-01 DIAGNOSIS — R42 Dizziness and giddiness: Secondary | ICD-10-CM | POA: Insufficient documentation

## 2019-10-01 DIAGNOSIS — R519 Headache, unspecified: Secondary | ICD-10-CM | POA: Insufficient documentation

## 2019-10-01 DIAGNOSIS — H538 Other visual disturbances: Secondary | ICD-10-CM | POA: Diagnosis not present

## 2019-10-01 DIAGNOSIS — R29810 Facial weakness: Secondary | ICD-10-CM | POA: Diagnosis not present

## 2019-10-01 DIAGNOSIS — I959 Hypotension, unspecified: Secondary | ICD-10-CM | POA: Diagnosis not present

## 2019-10-01 DIAGNOSIS — G4489 Other headache syndrome: Secondary | ICD-10-CM | POA: Diagnosis not present

## 2019-10-01 LAB — COMPREHENSIVE METABOLIC PANEL
ALT: 13 U/L (ref 0–44)
AST: 14 U/L — ABNORMAL LOW (ref 15–41)
Albumin: 4 g/dL (ref 3.5–5.0)
Alkaline Phosphatase: 50 U/L (ref 38–126)
Anion gap: 5 (ref 5–15)
BUN: 11 mg/dL (ref 6–20)
CO2: 27 mmol/L (ref 22–32)
Calcium: 9.2 mg/dL (ref 8.9–10.3)
Chloride: 108 mmol/L (ref 98–111)
Creatinine, Ser: 0.57 mg/dL (ref 0.44–1.00)
GFR calc Af Amer: >60 mL/min (ref >60)
GFR calc non Af Amer: >60 mL/min (ref >60)
Glucose, Bld: 140 mg/dL — ABNORMAL HIGH (ref 70–99)
Potassium: 3.6 mmol/L (ref 3.5–5.1)
Sodium: 140 mmol/L (ref 135–145)
Total Bilirubin: 0.5 mg/dL (ref 0.3–1.2)
Total Protein: 6.9 g/dL (ref 6.5–8.1)

## 2019-10-01 LAB — CBC WITH DIFFERENTIAL/PLATELET
Abs Immature Granulocytes: 0.01 10*3/uL (ref 0.00–0.07)
Basophils Absolute: 0 10*3/uL (ref 0.0–0.1)
Basophils Relative: 0 %
Eosinophils Absolute: 0.1 10*3/uL (ref 0.0–0.5)
Eosinophils Relative: 1 %
HCT: 39.4 % (ref 36.0–46.0)
Hemoglobin: 12.4 g/dL (ref 12.0–15.0)
Immature Granulocytes: 0 %
Lymphocytes Relative: 36 %
Lymphs Abs: 2.3 10*3/uL (ref 0.7–4.0)
MCH: 29.9 pg (ref 26.0–34.0)
MCHC: 31.5 g/dL (ref 30.0–36.0)
MCV: 94.9 fL (ref 80.0–100.0)
Monocytes Absolute: 0.6 10*3/uL (ref 0.1–1.0)
Monocytes Relative: 10 %
Neutro Abs: 3.3 10*3/uL (ref 1.7–7.7)
Neutrophils Relative %: 53 %
Platelets: 282 10*3/uL (ref 150–400)
RBC: 4.15 MIL/uL (ref 3.87–5.11)
RDW: 13.7 % (ref 11.5–15.5)
WBC: 6.3 10*3/uL (ref 4.0–10.5)
nRBC: 0 % (ref 0.0–0.2)

## 2019-10-01 LAB — ETHANOL: Alcohol, Ethyl (B): <10 mg/dL (ref <10)

## 2019-10-01 LAB — I-STAT BETA HCG BLOOD, ED (MC, WL, AP ONLY): I-stat hCG, quantitative: <5 m[IU]/mL (ref <5)

## 2019-10-01 MED ORDER — SODIUM CHLORIDE 0.9 % IV BOLUS
1000.0000 mL | Freq: Once | INTRAVENOUS | Status: AC
  Administered 2019-10-01: 17:00:00 1000 mL via INTRAVENOUS

## 2019-10-01 MED ORDER — ACETAMINOPHEN 500 MG PO TABS
1000.0000 mg | ORAL_TABLET | Freq: Once | ORAL | Status: AC
  Administered 2019-10-01: 1000 mg via ORAL
  Filled 2019-10-01: qty 2

## 2019-10-01 NOTE — ED Notes (Signed)
Pt more alert and responsive to orientation questions. Pt states she feels much better. Pt provided with crackers and peanut butter as well as ginger ale. Will continue to monitor for possible discharge home

## 2019-10-01 NOTE — ED Provider Notes (Signed)
[facility] DEPT Provider Note   CSN: [id number] Arrival date & time: 10/01/19  1610     History Chief Complaint  Patient presents with  . Dizziness  . Headache  . Altered Mental Status    Natasha [name] is a 34 y.o. female.  Natasha [name] is a 34 y.o. female who is otherwise healthy, presents to the ED via EMS for confusion.  Patient states she is just been feeling confused and out of it over the past several days.  She was seen for the same yesterday and had reassuring lab work.  She states she is continued to be confused, family reports she has been confused ever since last Saturday after eating a marijuana brownie.  Patient states she ate the entire brownie over a period of several hours and has not felt normal ever since then.  She states she just feels very out of it.  Per nursing staff patient was not able to state the day of the week, month or year and was not sure where she was located.  She states that she thinks she lives with her 4 children, she knows that she came here in an ambulance but is not sure who called the ambulance, she thinks it was her cousin who was visiting her.  Today she complains of a mild frontal headache, and feeling a little bit dizzy but when asked to describe dizziness she states she just feels out of it.  She denies any visual changes.  No falls or loss of balance, no recent head trauma.  She denies any numbness tingling or weakness in extremities.  No seizure-like activity.  No fevers or infectious symptoms.  No associated chest pain, shortness of breath or abdominal pain.  No nausea or vomiting.  Aside from recent edible use she denies any other drugs, reports she drinks wine occasionally and last had wine she thinks about 3 days ago.        Past Medical History:  Diagnosis Date  . History of preterm delivery 01/19/2017   3 preterm deliveries Had Makena 2012 with del at 36 wks Agrees to [facility] this time  . Hypertension      Patient Active Problem List   Diagnosis Date Noted  . SVD (spontaneous vaginal delivery) 06/08/2017  . Preterm labor 06/07/2017  . Gestational diabetes 06/03/2017  . High risk pregnancy with low PAPPA (pregnancy-associated plasma protein A) 03/16/2017  . Supervision of high risk pregnancy, antepartum 01/26/2017  . History of preterm delivery 01/19/2017  . 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 IUD REMOVAL    . LAPAROSCOPY  02/06/2011   Procedure: LAPAROSCOPY OPERATIVE;  Surgeon: Osborne Oman, MD;  Location: [facility];  Service: Gynecology;  Laterality: N/A;  Removal of Mirena IUD from sigmoid serosa, enterolysis, proctoscopy  . PROCTOSCOPY  02/06/2011   Procedure: PROCTOSCOPY;  Surgeon: Adin Hector, MD;  Location: [facility];  Service: General;  Laterality: N/A;     OB History    Gravida  4   Para  4   Term  0   Preterm  4   AB      Living  4     SAB      TAB      Ectopic      Multiple  0   Live Births  4           Family History  Problem Relation  Age of Onset  . Hypertension Paternal Grandmother   . Hypertension Paternal Grandfather   . Hypertension Maternal Grandmother   . Hypertension Maternal Grandfather     Social History   Tobacco Use  . Smoking status: Never Smoker  . Smokeless tobacco: Never Used  Substance Use Topics  . Alcohol use: No  . Drug use: No    Home Medications Prior to Admission medications   Medication Sig Start Date End Date Taking? Authorizing Provider  metroNIDAZOLE (FLAGYL) 500 MG tablet Take 1 tablet (500 mg total) by mouth 2 (two) times daily for 7 days. 09/26/19 10/03/19  Raylene Everts, MD    Allergies    Patient has no known allergies.  Review of Systems   Review of Systems  Constitutional: Negative for chills and fever.  HENT: Negative.   Respiratory: Negative for cough and shortness of breath.   Cardiovascular: Negative for chest pain.   Gastrointestinal: Negative for abdominal distention and diarrhea.  Musculoskeletal: Negative for arthralgias and myalgias.  Skin: Negative for color change and rash.  Neurological: Positive for headaches. Negative for dizziness, syncope, weakness, light-headedness and numbness.  Psychiatric/Behavioral: Positive for confusion.    Physical Exam Updated Vital Signs BP 114/73 (BP Location: Left Arm)   Pulse 71   Temp 98 F (36.7 C)   Resp 18   LMP 09/17/2019   SpO2 100%   Physical Exam Vitals and nursing note reviewed.  Constitutional:      General: She is not in acute distress.    Appearance: She is well-developed and normal weight. She is not ill-appearing or diaphoretic.  HENT:     Head: Normocephalic and atraumatic.     Mouth/Throat:     Mouth: Mucous membranes are moist.     Pharynx: Oropharynx is clear.     Comments: Yellow coating on tongue, pt reports eating candy prior to arrival Eyes:     General:        Right eye: No discharge.        Left eye: No discharge.     Extraocular Movements: Extraocular movements intact.     Pupils: Pupils are equal, round, and reactive to light.  Cardiovascular:     Rate and Rhythm: Normal rate and regular rhythm.     Heart sounds: Normal heart sounds. No murmur. No friction rub. No gallop.   Pulmonary:     Effort: Pulmonary effort is normal. No respiratory distress.     Breath sounds: Normal breath sounds. No wheezing or rales.     Comments: Respirations equal and unlabored, patient able to speak in full sentences, lungs clear to auscultation bilaterally Abdominal:     General: Bowel sounds are normal. There is no distension.     Palpations: Abdomen is soft. There is no mass.     Tenderness: There is no abdominal tenderness. There is no guarding.     Comments: Abdomen soft, nondistended, nontender to palpation in all quadrants without guarding or peritoneal signs  Musculoskeletal:        General: No deformity.     Cervical back:  Neck supple.  Skin:    General: Skin is warm and dry.     Capillary Refill: Capillary refill takes less than 2 seconds.  Neurological:     Mental Status: She is alert.     GCS: GCS eye subscore is 4. GCS verbal subscore is 5. GCS motor subscore is 6.     Coordination: Coordination normal.     Comments:  Speech is clear, able to follow commands, alert and oriented to self and place but not time CN III-XII intact Normal strength in upper and lower extremities bilaterally including dorsiflexion and plantar flexion, strong and equal grip strength Sensation normal to light and sharp touch Moves extremities without ataxia, coordination intact  Psychiatric:        Cognition and Memory: Cognition is impaired.     ED Results / Procedures / Treatments   Labs (all labs ordered are listed, but only abnormal results are displayed) Labs Reviewed  COMPREHENSIVE METABOLIC PANEL - Abnormal; Notable for the following components:      Result Value   Glucose, Bld 140 (*)    AST 14 (*)    All other components within normal limits  CBC WITH DIFFERENTIAL/PLATELET  ETHANOL  I-STAT BETA HCG BLOOD, ED ([facility], [facility], [facility] ONLY)    EKG None  Radiology CT Head Wo Contrast  Result Date: 10/01/2019 CLINICAL DATA:  34 year old with one-week history of progressively worsening acute mental status changes. She also complains of headache, dizziness and blurred vision. EXAM: CT HEAD WITHOUT CONTRAST TECHNIQUE: Contiguous axial images were obtained from the base of the skull through the vertex without intravenous contrast. COMPARISON:  None. FINDINGS: Brain: Ventricular system normal in size and appearance for age. No mass lesion. No midline shift. No acute hemorrhage or hematoma. No extra-axial fluid collections. No evidence of acute infarction. No focal brain parenchymal abnormalities. Vascular: No hyperdense vessel. No visible atherosclerosis. Skull: No skull fracture or other focal osseous abnormality involving the skull.  Sinuses/Orbits: Visualized paranasal sinuses, bilateral mastoid air cells and bilateral middle ear cavities well-aerated. Visualized orbits and globes normal in appearance. Other: None. IMPRESSION: Normal examination. Electronically Signed   By: Evangeline Dakin M.D.   On: 10/01/2019 17:33    Procedures Procedures (including critical care time)  Medications Ordered in ED Medications  sodium chloride 0.9 % bolus 1,000 mL (1,000 mLs Intravenous New Bag/Given (Non-Interop) 10/01/19 1706)  acetaminophen (TYLENOL) tablet 1,000 mg (1,000 mg Oral Given 10/01/19 1659)    ED Course  I have reviewed the triage vital signs and the nursing notes.  Pertinent labs & imaging results that were available during my care of the patient were reviewed by me and considered in my medical decision making (see chart for details).    MDM Rules/Calculators/A&P                      Patient presents with confusion after eating an entire marijuana brownie a few days ago.  She states she just feels out of it, she has had an occasional mild frontal headache but denies any other focal symptoms.  She states she is dizzy but then describes that it is just feeling disoriented.  On arrival she has normal vitals and is overall well-appearing she has no focal neurologic deficits and exam is otherwise unremarkable.  She was seen yesterday for similar and had reassuring lab work, given persistent confusion will check lab work and head CT.  Will give IV fluids and Tylenol.  Patient's lab work is unremarkable, no leukocytosis, normal hemoglobin and no acute electrolyte derangements, normal renal and liver function, negative ethanol level, negative pregnancy.  CT of the head with no acute abnormalities.  On reevaluation after fluids patient reports she is feeling better and more like herself, she is now oriented to person, place and time, is able to tell me where she lives, she lives at home with her 4 children  and is able to list their  ages.  I feel patient's confusion is likely related to high-dose of edible marijuana.  She ate some candies prior to arrival here, question whether these could potentially have any marijuana in them as well contributing to her continued confusion.  I have counseled her on avoiding edible marijuana products, alcohol or other substances.  Strict return precautions discussed.  Patient expresses understanding and agreement with plan.  Discharged home in good condition.  Final Clinical Impression(s) / ED Diagnoses Final diagnoses:  Confusion    Rx / DC Orders ED Discharge Orders    None       Janet Berlin 10/01/19 1927    Drenda Freeze, MD 10/02/19 [phone]

## 2019-10-01 NOTE — Discharge Instructions (Signed)
Please avoid marijuana, alcohol or any other substances.  Your head CT and lab work today are reassuring and glad that your confusion has improved.  Continue to drink plenty of water.  Return to the ED for any new or worsening symptoms.

## 2019-10-01 NOTE — ED Notes (Signed)
Pt transported to CT ?

## 2019-10-01 NOTE — ED Triage Notes (Signed)
34 yo female BIB GEM from home. Pt was seen yesterday at [facility] and diagnosed with a vaginal infection and discharged at 64 pm. Pts family states she has been AMS since last Saturday after having a bad reaction to edibles. Pts family states that pt is not doing better and wants second opinion per EMS. Pt c/o headache dizziness and blurry vision. Pt currently AOx2 per EMS.  Vitals: 120/72 Hr 80 cbg 193

## 2019-10-03 ENCOUNTER — Telehealth (HOSPITAL_COMMUNITY): Payer: Self-pay | Admitting: Emergency Medicine

## 2019-10-03 MED ORDER — METRONIDAZOLE 500 MG PO TABS: 500.0000 mg | ORAL_TABLET | Freq: Two times a day (BID) | ORAL | 0 refills | Status: AC

## 2019-10-03 NOTE — Telephone Encounter (Signed)
Pt sts she spilled her meds on floor; resent RX

## 2019-10-04 DIAGNOSIS — F1210 Cannabis abuse, uncomplicated: Secondary | ICD-10-CM | POA: Diagnosis not present

## 2019-10-04 DIAGNOSIS — F329 Major depressive disorder, single episode, unspecified: Secondary | ICD-10-CM | POA: Diagnosis not present

## 2019-10-12 NOTE — Progress Notes (Signed)
Patient ID: Natasha [name], female   DOB: 07-05-1986, 34 y.o.   MRN: [id number] Virtual Visit via Telephone Note  I connected with Natasha [name] on 10/13/19 at  3:10 PM EDT by telephone and verified that I am speaking with the correct person using two identifiers.   I discussed the limitations, risks, security and privacy concerns of performing an evaluation and management service by telephone and the availability of in person appointments. I also discussed with the patient that there may be a patient responsible charge related to this service. The patient expressed understanding and agreed to proceed.  PATIENT visit by telephone virtually in the context of Covid-19 pandemic. Patient location:  home My Location:  [facility] office Persons on the call:  Me and the patient and the daughter Natasha [name](44 yrs old)  History of Present Illness: After ED visit 10/01/2019.   Patient says she feels fine.  The daughter says she was repeating the same questions over and over at the time of the ED visit.  Quick mood changes at the time.  She was oriented to person and place at the ED.   It is of note that she had eaten brownies with marijuana in the prior to the onset of the symptoms. The daughter says that her mom is very stressed out and she thinks her mom "snapped."  When I asked her what she meant by that she said "I just think she's really tired."  The patient denies SI/HI.  The patient does admit to some depression and says she is willing to meet with our social worker and wants a referral to a psychiatrist.  The patient needs a note stating she can return to work. She has felt pretty much back to normal once the [facility] wore off.  The daughter also asked for a work note but I told her she would need to get that from her personal physician.    From ED note: Natasha [name] is a 34 y.o. female.  Natasha [name] is a 34 y.o. female who is otherwise healthy, presents to the ED via EMS for confusion.  Patient states she is just been  feeling confused and out of it over the past several days.  She was seen for the same yesterday and had reassuring lab work.  She states she is continued to be confused, family reports she has been confused ever since last Saturday after eating a marijuana brownie.  Patient states she ate the entire brownie over a period of several hours and has not felt normal ever since then.  She states she just feels very out of it.  Per nursing staff patient was not able to state the day of the week, month or year and was not sure where she was located.  She states that she thinks she lives with her 4 children, she knows that she came here in an ambulance but is not sure who called the ambulance, she thinks it was her cousin who was visiting her.  Today she complains of a mild frontal headache, and feeling a little bit dizzy but when asked to describe dizziness she states she just feels out of it.  She denies any visual changes.  No falls or loss of balance, no recent head trauma.  She denies any numbness tingling or weakness in extremities.  No seizure-like activity.  No fevers or infectious symptoms.  No associated chest pain, shortness of breath or abdominal pain.  No nausea or vomiting.  Aside  from recent edible use she denies any other drugs, reports she drinks wine occasionally and last had wine she thinks about 3 days ago.  From A/P: Patient presents with confusion after eating an entire marijuana brownie a few days ago.  She states she just feels out of it, she has had an occasional mild frontal headache but denies any other focal symptoms.  She states she is dizzy but then describes that it is just feeling disoriented.  On arrival she has normal vitals and is overall well-appearing she has no focal neurologic deficits and exam is otherwise unremarkable.  She was seen yesterday for similar and had reassuring lab work, given persistent confusion will check lab work and head CT.  Will give IV fluids and  Tylenol.  Patient's lab work is unremarkable, no leukocytosis, normal hemoglobin and no acute electrolyte derangements, normal renal and liver function, negative ethanol level, negative pregnancy.  CT of the head with no acute abnormalities.  On reevaluation after fluids patient reports she is feeling better and more like herself, she is now oriented to person, place and time, is able to tell me where she lives, she lives at home with her 4 children and is able to list their ages.  I feel patient's confusion is likely related to high-dose of edible marijuana.  She ate some candies prior to arrival here, question whether these could potentially have any marijuana in them as well contributing to her continued confusion.  I have counseled her on avoiding edible marijuana products, alcohol or other substances.  Strict return precautions discussed.  Patient expresses understanding and agreement with plan.  Discharged home in good condition.    Observations/Objective:  NAD.  A&Ox3   Assessment and Plan: 1. Memory changes resolved - Ambulatory referral to Social Work - Ambulatory referral to Psychiatry  2. Encounter for examination following treatment at hospital improving    Follow Up Instructions: assign PCP 6 weeks    I discussed the assessment and treatment plan with the patient. The patient was provided an opportunity to ask questions and all were answered. The patient agreed with the plan and demonstrated an understanding of the instructions.   The patient was advised to call back or seek an in-person evaluation if the symptoms worsen or if the condition fails to improve as anticipated.  I provided 14 minutes of non-face-to-face time during this encounter.   Freeman Caldron, PA-C

## 2019-10-13 ENCOUNTER — Ambulatory Visit: Payer: BC Managed Care – PPO | Attending: Family Medicine | Admitting: Physician Assistant

## 2019-10-13 ENCOUNTER — Other Ambulatory Visit: Payer: Self-pay | Admitting: Surgery

## 2019-10-13 ENCOUNTER — Other Ambulatory Visit: Payer: Self-pay

## 2019-10-13 DIAGNOSIS — Z09 Encounter for follow-up examination after completed treatment for conditions other than malignant neoplasm: Secondary | ICD-10-CM

## 2019-10-13 DIAGNOSIS — R413 Other amnesia: Secondary | ICD-10-CM

## 2019-10-13 DIAGNOSIS — N6459 Other signs and symptoms in breast: Secondary | ICD-10-CM

## 2019-10-31 ENCOUNTER — Other Ambulatory Visit: Payer: Self-pay

## 2019-10-31 ENCOUNTER — Ambulatory Visit: Payer: BC Managed Care – PPO | Attending: Family Medicine | Admitting: Licensed Clinical Social Worker

## 2019-10-31 DIAGNOSIS — F439 Reaction to severe stress, unspecified: Secondary | ICD-10-CM

## 2019-10-31 NOTE — BH Specialist Note (Addendum)
Integrated Behavioral Health Visit via Telemedicine (Telephone)  10/31/2019 [place] [id number]   Session Start time: 3:05 PM  Session End time: 3:25 PM Total time: 20  Referring Provider: Weyman Pedro Type of Visit: Telephonic Patient location: Home [facility] Provider location: Office All persons participating in visit: LCSW and patient  Confirmed patient's address: Yes  Confirmed patient's phone number: Yes  Any changes to demographics: No   Confirmed patient's insurance: Yes  Any changes to patient's insurance: No   Discussed confidentiality: Yes    The following statements were read to the patient and/or legal guardian that are established with the [facility] Provider.  "The purpose of this phone visit is to provide behavioral health care while limiting exposure to the coronavirus (COVID19).  There is a possibility of technology failure and discussed alternative modes of communication if that failure occurs."  "By engaging in this telephone visit, you consent to the provision of healthcare.  Additionally, you authorize for your insurance to be billed for the services provided during this telephone visit."   Patient and/or legal guardian consented to telephone visit: Yes   PRESENTING CONCERNS: Patient and/or family reports the following symptoms/concerns: Pt reports difficulty managing stress with working and caring for four children. She reports inadequate sleep and decreased appetite.  Duration of problem: Ongoing; Severity of problem: moderate  STRENGTHS (Protective Factors/Coping Skills): Pt receives emotional support from family Pt is employed  GOALS ADDRESSED: Patient will: 1.  Reduce symptoms of: stress  2.  Increase knowledge and/or ability of: coping skills, healthy habits and stress reduction  3.  Demonstrate ability to: Increase healthy adjustment to current life circumstances and Increase adequate support systems for  patient/family  INTERVENTIONS: Interventions utilized:  Solution-Focused Strategies, Supportive Counseling and Psychoeducation and/or Health Education Standardized Assessments completed: Not Needed  ASSESSMENT: Patient currently experiencing difficulty managing stress. She receives limited support and reports difficulty sleeping.    LCSW educated pt on the correlation between one's physical and mental health, in addition how stress can negatively impact both. Self-care strategies were discussed to assist with management and/or decrease of symptoms.  Pt was encouraged to schedule an appointment with Front Desk to establish care with a PCP.   PLAN: 1. Follow up with behavioral health clinician on : Contact LCSW with additional behavioral health and/or resource needs 2. Behavioral recommendations: Utilize self care strategies and healthy coping skills to decrease in stress management  3. Referral(s): [facility])  Rebekah Chesterfield, [place] 10/31/2019 4:00 PM

## 2019-11-10 ENCOUNTER — Encounter: Payer: Self-pay | Admitting: *Deleted

## 2019-12-12 DIAGNOSIS — Z3009 Encounter for other general counseling and advice on contraception: Secondary | ICD-10-CM | POA: Diagnosis not present

## 2019-12-12 DIAGNOSIS — Z3200 Encounter for pregnancy test, result unknown: Secondary | ICD-10-CM | POA: Diagnosis not present

## 2019-12-16 ENCOUNTER — Encounter (HOSPITAL_COMMUNITY): Payer: Self-pay | Admitting: Obstetrics and Gynecology

## 2019-12-16 ENCOUNTER — Inpatient Hospital Stay (HOSPITAL_COMMUNITY): Payer: BC Managed Care – PPO

## 2019-12-16 ENCOUNTER — Other Ambulatory Visit: Payer: Self-pay

## 2019-12-16 ENCOUNTER — Inpatient Hospital Stay (HOSPITAL_COMMUNITY)
Admission: AD | Admit: 2019-12-16 | Discharge: 2019-12-16 | Disposition: A | Discharge status: Home / Self Care | Payer: BC Managed Care – PPO | Attending: Obstetrics and Gynecology | Admitting: Obstetrics and Gynecology

## 2019-12-16 DIAGNOSIS — O09211 Supervision of pregnancy with history of pre-term labor, first trimester: Secondary | ICD-10-CM | POA: Insufficient documentation

## 2019-12-16 DIAGNOSIS — D251 Intramural leiomyoma of uterus: Secondary | ICD-10-CM | POA: Insufficient documentation

## 2019-12-16 DIAGNOSIS — O209 Hemorrhage in early pregnancy, unspecified: Secondary | ICD-10-CM | POA: Diagnosis not present

## 2019-12-16 DIAGNOSIS — Z3A01 Less than 8 weeks gestation of pregnancy: Secondary | ICD-10-CM | POA: Diagnosis not present

## 2019-12-16 DIAGNOSIS — O30001 Twin pregnancy, unspecified number of placenta and unspecified number of amniotic sacs, first trimester: Secondary | ICD-10-CM | POA: Diagnosis not present

## 2019-12-16 DIAGNOSIS — O10911 Unspecified pre-existing hypertension complicating pregnancy, first trimester: Secondary | ICD-10-CM | POA: Diagnosis not present

## 2019-12-16 DIAGNOSIS — O4690 Antepartum hemorrhage, unspecified, unspecified trimester: Secondary | ICD-10-CM

## 2019-12-16 DIAGNOSIS — O30031 Twin pregnancy, monochorionic/diamniotic, first trimester: Secondary | ICD-10-CM

## 2019-12-16 DIAGNOSIS — O021 Missed abortion: Secondary | ICD-10-CM | POA: Diagnosis not present

## 2019-12-16 DIAGNOSIS — Z3A08 8 weeks gestation of pregnancy: Secondary | ICD-10-CM | POA: Insufficient documentation

## 2019-12-16 DIAGNOSIS — O3411 Maternal care for benign tumor of corpus uteri, first trimester: Secondary | ICD-10-CM | POA: Diagnosis not present

## 2019-12-16 LAB — URINALYSIS, ROUTINE W REFLEX MICROSCOPIC
Bilirubin Urine: NEGATIVE
Glucose, UA: NEGATIVE mg/dL
Ketones, ur: NEGATIVE mg/dL
Leukocytes,Ua: NEGATIVE
Nitrite: NEGATIVE
Protein, ur: NEGATIVE mg/dL
Specific Gravity, Urine: 1.021 (ref 1.005–1.030)
pH: 6 (ref 5.0–8.0)

## 2019-12-16 LAB — CBC
HCT: 39.3 % (ref 36.0–46.0)
Hemoglobin: 12.6 g/dL (ref 12.0–15.0)
MCH: 30.3 pg (ref 26.0–34.0)
MCHC: 32.1 g/dL (ref 30.0–36.0)
MCV: 94.5 fL (ref 80.0–100.0)
Platelets: 315 10*3/uL (ref 150–400)
RBC: 4.16 MIL/uL (ref 3.87–5.11)
RDW: 13.5 % (ref 11.5–15.5)
WBC: 8.2 10*3/uL (ref 4.0–10.5)
nRBC: 0 % (ref 0.0–0.2)

## 2019-12-16 LAB — POCT PREGNANCY, URINE: Preg Test, Ur: POSITIVE — AB

## 2019-12-16 LAB — WET PREP, GENITAL
Clue Cells Wet Prep HPF POC: NONE SEEN
Sperm: NONE SEEN
Trich, Wet Prep: NONE SEEN
Yeast Wet Prep HPF POC: NONE SEEN

## 2019-12-16 LAB — HCG, QUANTITATIVE, PREGNANCY: hCG, Beta Chain, Quant, S: 3998 m[IU]/mL — ABNORMAL HIGH (ref <5)

## 2019-12-16 MED ORDER — ACETAMINOPHEN-CODEINE #3 300-30 MG PO TABS: 1.0000 | ORAL_TABLET | Freq: Four times a day (QID) | ORAL | 0 refills | Status: DC | PRN

## 2019-12-16 MED ORDER — MISOPROSTOL 200 MCG PO TABS: ORAL_TABLET | ORAL | 0 refills | Status: DC

## 2019-12-16 MED ORDER — ONDANSETRON 4 MG PO TBDP
4.0000 mg | ORAL_TABLET | Freq: Three times a day (TID) | ORAL | 0 refills | Status: DC | PRN
Start: 2019-12-16 — End: 2020-02-28

## 2019-12-16 NOTE — MAU Provider Note (Addendum)
History     CSN: [id number]  Arrival date and time: 12/16/19 1315   First Provider Initiated Contact with Patient 12/16/19 1419      Chief Complaint  Patient presents with  . Vaginal Bleeding  . Abdominal Pain  . Possible Pregnancy   Natasha [name] is a 34 y.o. G5P4 at [redacted]w[redacted]d who presents to MAU with complaints of vaginal bleeding. Patient reports vaginal bleeding starting occurring this morning when she wiped. She describes the vaginal bleeding as light spotting that is pinkish red. She denies having to wear a pad or panty liner for vaginal bleeding. She reports that she was having lower abdominal intermittent cramping but denies having any cramping or abdominal pain currently. She reports IC 2 days ago then 2 days prior to that, reports that after every sexual encounter she has vaginal bleeding.    OB History    Gravida  5   Para  4   Term  0   Preterm  4   AB      Living  4     SAB      TAB      Ectopic      Multiple  0   Live Births  4           Past Medical History:  Diagnosis Date  . History of preterm delivery 01/19/2017   3 preterm deliveries Had Makena 2012 with del at 36 wks Agrees to [facility] this time  . Hypertension     Past Surgical History:  Procedure Laterality Date  . IUD REMOVAL    . LAPAROSCOPY  02/06/2011   Procedure: LAPAROSCOPY OPERATIVE;  Surgeon: Osborne Oman, MD;  Location: [facility];  Service: Gynecology;  Laterality: N/A;  Removal of Mirena IUD from sigmoid serosa, enterolysis, proctoscopy  . PROCTOSCOPY  02/06/2011   Procedure: PROCTOSCOPY;  Surgeon: Adin Hector, MD;  Location: [facility];  Service: General;  Laterality: N/A;    Family History  Problem Relation Age of Onset  . Hypertension Paternal Grandmother   . Hypertension Paternal Grandfather   . Hypertension Maternal Grandmother   . Hypertension Maternal Grandfather     Social History   Tobacco Use  . Smoking status: Never Smoker  . Smokeless tobacco: Never Used   Substance Use Topics  . Alcohol use: No  . Drug use: No    Allergies: No Known Allergies  No medications prior to admission.    Review of Systems  Constitutional: Negative.   Respiratory: Negative.   Cardiovascular: Negative.   Gastrointestinal: Negative.   Genitourinary: Positive for vaginal bleeding. Negative for difficulty urinating, dysuria, frequency, pelvic pain and urgency.  Musculoskeletal: Negative.   Neurological: Negative.   Psychiatric/Behavioral: Negative.    Physical Exam   Blood pressure 101/83, pulse 85, temperature 98.5 F (36.9 C), temperature source Oral, resp. rate 18, height 5\' 1"  (1.549 m), weight 65.5 kg, last menstrual period 10/15/2019, SpO2 99 %.  Physical Exam  Nursing note and vitals reviewed. Constitutional: She is oriented to person, place, and time. She appears well-developed and well-nourished. No distress.  Cardiovascular: Normal rate and regular rhythm.  Respiratory: Effort normal and breath sounds normal. No respiratory distress. She has no wheezes.  GI: Soft. She exhibits no distension. There is no abdominal tenderness. There is no rebound and no guarding.  Genitourinary:    Vaginal bleeding present.  There is bleeding in the vagina.    Genitourinary Comments: Pelvic exam: Cervix pink, visually closed, without lesion,  moderate dark red vaginal bleeding (4 faux swabs), vaginal walls and external genitalia normal Bimanual exam: Cervix 0/long/high, firm, anterior, neg CMT, uterus nontender, nonenlarged, adnexa without tenderness, enlargement, or mass   Musculoskeletal:        General: No edema. Normal range of motion.  Neurological: She is alert and oriented to person, place, and time.  Psychiatric: She has a normal mood and affect. Her behavior is normal. Thought content normal.   MAU Course  Procedures  MDM Orders Placed This Encounter  Procedures  . Wet prep, genital  . Culture, OB Urine  . US OB Comp AddL Gest Less 14 Wks  . US  OB Transvaginal  . Urinalysis, Routine w reflex microscopic  . CBC  . hCG, quantitative, pregnancy  . Pregnancy, urine POC   Labs and [place] report reviewed:  Results for orders placed or performed during the hospital encounter of 12/16/19 (from the past 24 hour(s))  Urinalysis, Routine w reflex microscopic     Status: Abnormal   Collection Time: 12/16/19  1:46 PM  Result Value Ref Range   Color, Urine YELLOW YELLOW   APPearance HAZY (A) CLEAR   Specific Gravity, Urine 1.021 1.005 - 1.030   pH 6.0 5.0 - 8.0   Glucose, UA NEGATIVE NEGATIVE mg/dL   Hgb urine dipstick LARGE (A) NEGATIVE   Bilirubin Urine NEGATIVE NEGATIVE   Ketones, ur NEGATIVE NEGATIVE mg/dL   Protein, ur NEGATIVE NEGATIVE mg/dL   Nitrite NEGATIVE NEGATIVE   Leukocytes,Ua NEGATIVE NEGATIVE   RBC / HPF 21-50 0 - 5 RBC/hpf   WBC, UA 0-5 0 - 5 WBC/hpf   Bacteria, UA RARE (A) NONE SEEN   Squamous Epithelial / LPF 0-5 0 - 5   Mucus PRESENT   Pregnancy, urine POC     Status: Abnormal   Collection Time: 12/16/19  1:48 PM  Result Value Ref Range   Preg Test, Ur POSITIVE (A) NEGATIVE  Wet prep, genital     Status: Abnormal   Collection Time: 12/16/19  2:30 PM  Result Value Ref Range   Yeast Wet Prep HPF POC NONE SEEN NONE SEEN   Trich, Wet Prep NONE SEEN NONE SEEN   Clue Cells Wet Prep HPF POC NONE SEEN NONE SEEN   WBC, Wet Prep HPF POC MODERATE (A) NONE SEEN   Sperm NONE SEEN   CBC     Status: None   Collection Time: 12/16/19  2:44 PM  Result Value Ref Range   WBC 8.2 4.0 - 10.5 K/uL   RBC 4.16 3.87 - 5.11 MIL/uL   Hemoglobin 12.6 12.0 - 15.0 g/dL   HCT 39.3 36.0 - 46.0 %   MCV 94.5 80.0 - 100.0 fL   MCH 30.3 26.0 - 34.0 pg   MCHC 32.1 30.0 - 36.0 g/dL   RDW 13.5 11.5 - 15.5 %   Platelets 315 150 - 400 K/uL   nRBC 0.0 0.0 - 0.2 %  hCG, quantitative, pregnancy     Status: Abnormal   Collection Time: 12/16/19  2:44 PM  Result Value Ref Range   hCG, Beta Chain, Quant, S 3,998 (H) <5 mIU/mL   US OB Comp AddL  Gest Less 14 Wks  Result Date: 12/16/2019 CLINICAL DATA:  Vaginal bleeding and cramping in first trimester of pregnancy, quantitative beta HCG 3,998 today, LMP 10/15/2019 EXAM: TWIN OBSTETRIC <14WK [place] AND TRANSVAGINAL OB US COMPARISON:  None. FINDINGS: Number of IUPs:  2 Chorionicity/Amnionicity:  Monochorionic-diamniotic (thin membrane) TWIN 1 Yolk sac:  Not identified Embryo:  Present Cardiac Activity: Not identified Heart Rate: N/A bpm CRL:  10.0 mm   7 w 1 d                  [place] EDC: 08/02/2020 TWIN 2 Yolk sac:  Not identified Embryo:  Present Cardiac Activity: Not identified Heart Rate: N/A bpm CRL:  10.4 mm   7 w 1 d                  [place] EDC: 08/02/2020 Small subchronic hemorrhage Subchorionic hemorrhage:  None visualized. Maternal uterus/adnexae: Maternal uterus anteverted with several tiny intramural leiomyomata identified. LEFT ovary normal size and morphology, 3.2 x 2.3 x 3.0 cm. RIGHT ovary normal size and morphology 2.5 x 1.4 x 2.2 cm. Trace free pelvic fluid. No adnexal masses. IMPRESSION: Twin intrauterine gestations identified as above. No fetal cardiac activity is seen within either fetal pole. Findings meet definitive criteria for failed pregnancy. This follows SRU consensus guidelines: Diagnostic Criteria for Nonviable Pregnancy Early in the First Trimester. Alison Stalling J Med [phone]. Electronically Signed   By: Lavonia Dana M.D.   On: 12/16/2019 16:50   US OB Transvaginal  Result Date: 12/16/2019 CLINICAL DATA:  Vaginal bleeding and cramping in first trimester of pregnancy, quantitative beta HCG 3,998 today, LMP 10/15/2019 EXAM: TWIN OBSTETRIC <14WK [place] AND TRANSVAGINAL OB US COMPARISON:  None. FINDINGS: Number of IUPs:  2 Chorionicity/Amnionicity:  Monochorionic-diamniotic (thin membrane) TWIN 1 Yolk sac:  Not identified Embryo:  Present Cardiac Activity: Not identified Heart Rate: N/A bpm CRL:  10.0 mm   7 w 1 d                  [place] EDC: 08/02/2020 TWIN 2 Yolk sac:  Not identified Embryo:   Present Cardiac Activity: Not identified Heart Rate: N/A bpm CRL:  10.4 mm   7 w 1 d                  [place] EDC: 08/02/2020 Small subchronic hemorrhage Subchorionic hemorrhage:  None visualized. Maternal uterus/adnexae: Maternal uterus anteverted with several tiny intramural leiomyomata identified. LEFT ovary normal size and morphology, 3.2 x 2.3 x 3.0 cm. RIGHT ovary normal size and morphology 2.5 x 1.4 x 2.2 cm. Trace free pelvic fluid. No adnexal masses. IMPRESSION: Twin intrauterine gestations identified as above. No fetal cardiac activity is seen within either fetal pole. Findings meet definitive criteria for failed pregnancy. This follows SRU consensus guidelines: Diagnostic Criteria for Nonviable Pregnancy Early in the First Trimester. Alison Stalling J Med [phone]. Electronically Signed   By: Lavonia Dana M.D.   On: 12/16/2019 16:50   Consult with Dr Elly Modena @ 1657 on assessment and management. Discussed if Cytotec can be offered in failed pregnancy of twin gestation. Dr Elly Modena recommends that cytotec can be given.   Discussed results of ultrasound and lab work with patient. Comfort given to patient.   Educated and discussed options of failed pregnancy with patient including expectant management, cytotec management or D&C. Patient given time to discuss with patient plan of care.   Patient request cytotec management and follow up in the office   Early Intrauterine Pregnancy Failure Protocol X  Documented intrauterine pregnancy failure less than or equal to [redacted] weeks   gestation  X  No serious current illness  X  Baseline Hgb greater than or equal to 10g/dl  X  Patient has easily accessible transportation to the hospital  X  Clear preference  X  Practitioner/physician deems patient reliable  X  Counseling by practitioner or physician  X  Patient education by RN  X  Medication dispensed  X  Cytotec 800 mcg Intravaginally by patient at home  X   Tylenol #3 mg by mouth every 4 to 6 hours as  needed - prescribed   Reviewed with pt cytotec procedure.  Pt verbalizes that she lives close to the hospital and has transportation readily available.  Pt appears reliable and verbalizes understanding and agrees with plan of care  Warning signs discussed with patient and reasons to present back to MAU immediately. Message sent to office to schedule follow up appointments. Pt stable at time of discharge.  Assessment and Plan   1. Missed abortion   2. Vaginal bleeding during pregnancy   3. Monochorionic diamniotic twin gestation in first trimester    Discharge home Message sent to office to schedule follow up appointments  Return to MAU as needed for reasons discussed and/or emergencies  Rx for cytotec, tylenol #3 and zofran sent to pharmacy of choice   [facility] [place] [facility]. Schedule an appointment as soon as possible for a visit.   Specialty: Obstetrics and Gynecology Why: Make appointment in 1 week for lab work and repeat ultrasound, then in 2 weeks with provider  Contact information: [address]  [place] [phone]         Allergies as of 12/16/2019   No Known Allergies     Medication List    TAKE these medications   acetaminophen-codeine 300-30 MG tablet Commonly known as: TYLENOL #3 Take 1-2 tablets by mouth every 6 (six) hours as needed for moderate pain.   misoprostol 200 MCG tablet Commonly known as: CYTOTEC insert four tablets vaginally   ondansetron 4 MG disintegrating tablet Commonly known as: Zofran ODT Take 1 tablet (4 mg total) by mouth every 8 (eight) hours as needed for nausea or vomiting.       Lajean Manes CNM 12/16/2019, 6:29 PM

## 2019-12-16 NOTE — Discharge Instructions (Signed)
Managing Pregnancy Loss °Pregnancy loss can happen any time during a pregnancy. Often the cause is not known. It is rarely because of anything you did. Pregnancy loss in early pregnancy (during the first trimester) is called a miscarriage. This type of pregnancy loss is the most common. Pregnancy loss that happens after 20 weeks of pregnancy is called fetal demise if the baby's heart stops beating before birth. Fetal demise is much less common. Some women experience spontaneous labor shortly after fetal demise resulting in a stillborn birth (stillbirth). °Any pregnancy loss can be devastating. You will need to recover both physically and emotionally. Most women are able to get pregnant again after a pregnancy loss and deliver a healthy baby. °How to manage emotional recovery ° °Pregnancy loss is very hard emotionally. You may feel many different emotions while you grieve. You may feel sad and angry. You may also feel guilty. It is normal to have periods of crying. Emotional recovery can take longer than physical recovery. It is different for everyone. °Taking these steps can help you in managing this loss: °· Remember that it is unlikely you did anything to cause the pregnancy loss. °· Share your thoughts and feelings with friends, family, and your partner. Remember that your partner is also recovering emotionally. °· Make sure you have a good support system. Do not spend too much time alone. °· Meet with a pregnancy loss counselor or join a pregnancy loss support group. °· Get enough sleep and eat a healthy diet. Return to regular exercise when you have recovered physically. °· Do not use drugs or alcohol to manage your emotions. °· Consider seeing a mental health professional to help you recover emotionally. °· Ask a friend or loved one to help you decide what to do with any clothing and nursery items you received for your baby. °In the case of a stillbirth, many women benefit from taking additional steps in the  grieving process. You may want to: °· Hold your baby after the birth. °· Name your baby. °· Request a birth certificate. °· Create a keepsake such as handprints or footprints. °· Dress your baby and have a picture taken. °· Make funeral arrangements. °· Ask for a baptism or blessing. °Hospitals have staff members who can help you with all these arrangements. °How to recognize emotional stress °It is normal to have emotional stress after a pregnancy loss. But emotional stress that lasts a long time or becomes severe requires treatment. Watch out for these signs of severe emotional stress: °· Sadness, anger, or guilt that is not going away and is interfering with your normal activities. °· Relationship problems that have occurred or gotten worse since the pregnancy loss. °· Signs of depression that last longer than 2 weeks. These may include: °? Sadness. °? Anxiety. °? Hopelessness. °? Loss of interest in activities you enjoy. °? Inability to concentrate. °? Trouble sleeping or sleeping too much. °? Loss of appetite or overeating. °? Thoughts of death or of hurting yourself. °Follow these instructions at home: °· Take over-the-counter and prescription medicines only as told by your health care provider. °· Rest at home until your energy level returns. Return to your normal activities as told by your health care provider. Ask your health care provider what activities are safe for you. °· When you are ready, meet with your health care provider to discuss steps to take for a future pregnancy. °· Keep all follow-up visits as told by your health care provider. This is important. °  Where to find support  To help you and your partner with the process of grieving, talk with your health care provider or seek counseling.  Consider meeting with others who have experienced pregnancy loss. Ask your health care provider about support groups and resources. Where to find more information  [place] Department of Health and Human  Services Office on Women's Health: www.womenshealth.gov  American Pregnancy Association: www.americanpregnancy.org Contact a health care provider if:  You continue to experience grief, sadness, or lack of motivation for everyday activities, and those feelings do not improve over time.  You are struggling to recover emotionally, especially if you are using alcohol or substances to help. Get help right away if:  You have thoughts of hurting yourself or others. If you ever feel like you may hurt yourself or others, or have thoughts about taking your own life, get help right away. You can go to your nearest emergency department or call:  Your local emergency services (911 in the [place]  A suicide crisis helpline, such as the National Suicide Prevention Lifeline at 1-800-273-8255. This is open 24 hours a day. Summary  Any pregnancy loss can be difficult physically and emotionally.  You may experience many different emotions while you grieve. Emotional recovery can last longer than physical recovery.  It is normal to have emotional stress after a pregnancy loss. But emotional stress that lasts a long time or becomes severe requires treatment.  See your health care provider if you are struggling emotionally after a pregnancy loss. This information is not intended to replace advice given to you by your health care provider. Make sure you discuss any questions you have with your health care provider. Document Revised: 11/03/2018 Document Reviewed: 09/24/2017 Elsevier Patient Education  2020 Elsevier Inc.  

## 2019-12-16 NOTE — MAU Note (Signed)
This morning she noted like bleeding, it was light. Pt is pregnant, has appt, has not been seen yet. Having a little cramping.

## 2019-12-17 LAB — CULTURE, OB URINE: Culture: NO GROWTH

## 2019-12-19 LAB — GC/CHLAMYDIA PROBE AMP (~~LOC~~) NOT AT ARMC
Chlamydia: NEGATIVE
Comment: NEGATIVE
Comment: NORMAL
Neisseria Gonorrhea: NEGATIVE

## 2019-12-21 ENCOUNTER — Other Ambulatory Visit: Payer: Self-pay | Admitting: *Deleted

## 2019-12-21 ENCOUNTER — Other Ambulatory Visit: Payer: Self-pay | Admitting: Lactation Services

## 2019-12-21 ENCOUNTER — Other Ambulatory Visit: Payer: BC Managed Care – PPO

## 2019-12-21 ENCOUNTER — Other Ambulatory Visit: Payer: Self-pay

## 2019-12-21 DIAGNOSIS — O021 Missed abortion: Secondary | ICD-10-CM

## 2019-12-21 DIAGNOSIS — O4690 Antepartum hemorrhage, unspecified, unspecified trimester: Secondary | ICD-10-CM

## 2019-12-22 LAB — BETA HCG QUANT (REF LAB): hCG Quant: 75 m[IU]/mL

## 2019-12-28 ENCOUNTER — Ambulatory Visit
Admission: RE | Admit: 2019-12-28 | Discharge: 2019-12-28 | Disposition: A | Discharge status: Home / Self Care | Payer: BC Managed Care – PPO | Source: Ambulatory Visit | Attending: Certified Nurse Midwife | Admitting: Certified Nurse Midwife

## 2019-12-28 ENCOUNTER — Other Ambulatory Visit: Payer: Self-pay

## 2019-12-28 ENCOUNTER — Ambulatory Visit (INDEPENDENT_AMBULATORY_CARE_PROVIDER_SITE_OTHER): Payer: BC Managed Care – PPO | Admitting: *Deleted

## 2019-12-28 DIAGNOSIS — Z3A00 Weeks of gestation of pregnancy not specified: Secondary | ICD-10-CM | POA: Diagnosis not present

## 2019-12-28 DIAGNOSIS — O4690 Antepartum hemorrhage, unspecified, unspecified trimester: Secondary | ICD-10-CM | POA: Insufficient documentation

## 2019-12-28 DIAGNOSIS — O30009 Twin pregnancy, unspecified number of placenta and unspecified number of amniotic sacs, unspecified trimester: Secondary | ICD-10-CM | POA: Diagnosis not present

## 2019-12-28 DIAGNOSIS — O021 Missed abortion: Secondary | ICD-10-CM

## 2019-12-28 NOTE — Progress Notes (Signed)
0915 Here for [place] results. Results not available in 20 minutes. [facility] and received results by phone.  Reviewed with Dr. Ilda Basset and informed Natasha [name] cytotec effective. No products of conception noted. Natasha [name] denies pain or bleeding.  Advised no unprotected intercourse for 1-2 months but patient choice. Advised 2 month fu for annual exam/ pap. Support given.  She voices understanding.  Natasha Henney,RN

## 2019-12-29 ENCOUNTER — Encounter: Payer: Self-pay | Admitting: *Deleted

## 2019-12-30 NOTE — Progress Notes (Signed)
Patient was assessed and managed by nursing staff during this encounter. I have reviewed the chart and agree with the documentation and plan. I have also made any necessary editorial changes.  Aletha Halim, MD 12/30/2019 8:57 AM

## 2020-01-10 ENCOUNTER — Encounter: Payer: BC Managed Care – PPO | Admitting: Family Medicine

## 2020-01-10 ENCOUNTER — Other Ambulatory Visit: Payer: Self-pay

## 2020-01-10 ENCOUNTER — Encounter: Payer: Self-pay | Admitting: Family Medicine

## 2020-01-10 ENCOUNTER — Ambulatory Visit (INDEPENDENT_AMBULATORY_CARE_PROVIDER_SITE_OTHER): Payer: BC Managed Care – PPO | Admitting: Family Medicine

## 2020-01-10 VITALS — BP 128/79 | HR 89 | Wt 143.0 lb

## 2020-01-10 DIAGNOSIS — O039 Complete or unspecified spontaneous abortion without complication: Secondary | ICD-10-CM

## 2020-01-10 DIAGNOSIS — Z319 Encounter for procreative management, unspecified: Secondary | ICD-10-CM | POA: Diagnosis not present

## 2020-01-10 MED ORDER — PRENATAL VITAMINS 28-0.8 MG PO TABS: 1.0000 | ORAL_TABLET | Freq: Every day | ORAL | 9 refills | Status: DC

## 2020-01-10 NOTE — Patient Instructions (Signed)
Miscarriage A miscarriage is the loss of an unborn baby (fetus) before the 20th week of pregnancy. Follow these instructions at home: Medicines   Take over-the-counter and prescription medicines only as told by your doctor.  If you were prescribed antibiotic medicine, take it as told by your doctor. Do not stop taking the antibiotic even if you start to feel better.  Do not take NSAIDs unless your doctor says that this is safe for you. NSAIDs include aspirin and ibuprofen. These medicines can cause bleeding. Activity  Rest as directed. Ask your doctor what activities are safe for you.  Have someone help you at home during this time. General instructions  Write down how many pads you use each day and how soaked they are.  Watch the amount of tissue or clumps of blood (blood clots) that you pass from your vagina. Save any large amounts of tissue for your doctor.  Do not use tampons, douche, or have sex until your doctor approves.  To help you and your partner with the process of grieving, talk with your doctor or seek counseling.  When you are ready, meet with your doctor to talk about steps you should take for your health. Also, talk with your doctor about steps to take to have a healthy pregnancy in the future.  Keep all follow-up visits as told by your doctor. This is important. Contact a doctor if:  You have a fever or chills.  You have vaginal discharge that smells bad.  You have more bleeding. Get help right away if:  You have very bad cramps or pain in your back or belly.  You pass clumps of blood that are walnut-sized or larger from your vagina.  You pass tissue that is walnut-sized or larger from your vagina.  You soak more than 1 regular pad in an hour.  You get light-headed or weak.  You faint (pass out).  You have feelings of sadness that do not go away, or you have thoughts of hurting yourself. Summary  A miscarriage is the loss of an unborn baby before  the 20th week of pregnancy.  Follow your doctor's instructions for home care. Keep all follow-up appointments.  To help you and your partner with the process of grieving, talk with your doctor or seek counseling. This information is not intended to replace advice given to you by your health care provider. Make sure you discuss any questions you have with your health care provider. Document Revised: 11/05/2018 Document Reviewed: 08/19/2016 Elsevier Patient Education  2020 Elsevier Inc.  

## 2020-01-10 NOTE — Progress Notes (Signed)
GYNECOLOGY OFFICE NOTE  History:  34 y.o. [id number] here today for follow up of SAB.  She passed her pregnancy on 12/16/19 after being seen in [facility] and being given cytotec. She reports that she is feeling much better. The pregnancy was unplanned, but desired. She does have 3 children at home under the age of 57. She does desire to get pregnant again. She has not yet had a period. She is not taking prenatal vitamins. She does not have any current health issues.   Past Medical History:  Diagnosis Date  . History of preterm delivery 01/19/2017   3 preterm deliveries Had Makena 2012 with del at 36 wks Agrees to [facility] this time  . Hypertension     Past Surgical History:  Procedure Laterality Date  . IUD REMOVAL    . LAPAROSCOPY  02/06/2011   Procedure: LAPAROSCOPY OPERATIVE;  Surgeon: Osborne Oman, MD;  Location: [place] [facility];  Service: Gynecology;  Laterality: N/A;  Removal of Mirena IUD from sigmoid serosa, enterolysis, proctoscopy  . PROCTOSCOPY  02/06/2011   Procedure: PROCTOSCOPY;  Surgeon: Adin Hector, MD;  Location: [place] [facility];  Service: General;  Laterality: N/A;     Current Outpatient Medications:  .  acetaminophen-codeine (TYLENOL #3) 300-30 MG tablet, Take 1-2 tablets by mouth every 6 (six) hours as needed for moderate pain., Disp: 15 tablet, Rfl: 0 .  misoprostol (CYTOTEC) 200 MCG tablet, insert four tablets vaginally, Disp: 4 tablet, Rfl: 0 .  ondansetron (ZOFRAN ODT) 4 MG disintegrating tablet, Take 1 tablet (4 mg total) by mouth every 8 (eight) hours as needed for nausea or vomiting., Disp: 15 tablet, Rfl: 0 .  Prenatal Vit-Fe Fumarate-FA (PRENATAL VITAMINS) 28-0.8 MG TABS, Take 1 tablet by mouth daily., Disp: 30 tablet, Rfl: 9  The following portions of the patient's history were reviewed and updated as appropriate: allergies, current medications, past family history, past medical history, past social history, past surgical history and problem list.   Review of Systems:    Pertinent items noted in HPI and remainder of comprehensive ROS otherwise negative.   Objective:  Physical Exam BP 128/79   Pulse 89   Wt 143 lb (64.9 kg)   LMP 10/15/2019   Breastfeeding Unknown   BMI 27.02 kg/m  CONSTITUTIONAL: Well-developed, well-nourished female in no acute distress.  HENT:  Normocephalic, atraumatic. External right and left ear normal. Oropharynx is clear and moist EYES: Conjunctivae and EOM are normal. Pupils are equal, round, and reactive to light. No scleral icterus.  NECK: Normal range of motion, supple, no masses SKIN: Skin is warm and dry. No rash noted. Not diaphoretic. No erythema. No pallor. NEUROLOGIC: Alert and oriented to person, place, and time. Normal reflexes, muscle tone coordination. No cranial nerve deficit noted. PSYCHIATRIC: Normal mood and affect. Normal behavior. Normal judgment and thought content. PHQ9- 7, GAD7- 4. CARDIOVASCULAR: Normal heart rate noted RESPIRATORY: Effort and breath sounds normal, no problems with respiration noted ABDOMEN: Soft, no distention noted.   PELVIC: Deferred per patient request MUSCULOSKELETAL: Normal range of motion. No edema noted.  Labs and Imaging US OB Comp AddL Gest Less 14 Wks  Result Date: 12/16/2019 CLINICAL DATA:  Vaginal bleeding and cramping in first trimester of pregnancy, quantitative beta HCG 3,998 today, LMP 10/15/2019 EXAM: TWIN OBSTETRIC <14WK [place] AND TRANSVAGINAL OB US COMPARISON:  None. FINDINGS: Number of IUPs:  2 Chorionicity/Amnionicity:  Monochorionic-diamniotic (thin membrane) TWIN 1 Yolk sac:  Not identified Embryo:  Present Cardiac Activity: Not identified Heart Rate:  N/A bpm CRL:  10.0 mm   7 w 1 d                  [place] EDC: 08/02/2020 TWIN 2 Yolk sac:  Not identified Embryo:  Present Cardiac Activity: Not identified Heart Rate: N/A bpm CRL:  10.4 mm   7 w 1 d                  [place] EDC: 08/02/2020 Small subchronic hemorrhage Subchorionic hemorrhage:  None visualized. Maternal  uterus/adnexae: Maternal uterus anteverted with several tiny intramural leiomyomata identified. LEFT ovary normal size and morphology, 3.2 x 2.3 x 3.0 cm. RIGHT ovary normal size and morphology 2.5 x 1.4 x 2.2 cm. Trace free pelvic fluid. No adnexal masses. IMPRESSION: Twin intrauterine gestations identified as above. No fetal cardiac activity is seen within either fetal pole. Findings meet definitive criteria for failed pregnancy. This follows SRU consensus guidelines: Diagnostic Criteria for Nonviable Pregnancy Early in the First Trimester. Alison Stalling J Med [phone]. Electronically Signed   By: Lavonia Dana M.D.   On: 12/16/2019 16:50   US OB Transvaginal  Result Date: 12/16/2019 CLINICAL DATA:  Vaginal bleeding and cramping in first trimester of pregnancy, quantitative beta HCG 3,998 today, LMP 10/15/2019 EXAM: TWIN OBSTETRIC <14WK [place] AND TRANSVAGINAL OB US COMPARISON:  None. FINDINGS: Number of IUPs:  2 Chorionicity/Amnionicity:  Monochorionic-diamniotic (thin membrane) TWIN 1 Yolk sac:  Not identified Embryo:  Present Cardiac Activity: Not identified Heart Rate: N/A bpm CRL:  10.0 mm   7 w 1 d                  [place] EDC: 08/02/2020 TWIN 2 Yolk sac:  Not identified Embryo:  Present Cardiac Activity: Not identified Heart Rate: N/A bpm CRL:  10.4 mm   7 w 1 d                  [place] EDC: 08/02/2020 Small subchronic hemorrhage Subchorionic hemorrhage:  None visualized. Maternal uterus/adnexae: Maternal uterus anteverted with several tiny intramural leiomyomata identified. LEFT ovary normal size and morphology, 3.2 x 2.3 x 3.0 cm. RIGHT ovary normal size and morphology 2.5 x 1.4 x 2.2 cm. Trace free pelvic fluid. No adnexal masses. IMPRESSION: Twin intrauterine gestations identified as above. No fetal cardiac activity is seen within either fetal pole. Findings meet definitive criteria for failed pregnancy. This follows SRU consensus guidelines: Diagnostic Criteria for Nonviable Pregnancy Early in the First  Trimester. Alison Stalling J Med [phone]. Electronically Signed   By: Lavonia Dana M.D.   On: 12/16/2019 16:50   US OB LESS THAN 14 WEEKS WITH OB TRANSVAGINAL  Result Date: 12/28/2019 CLINICAL DATA:  Twin pregnancy.  Medical abortion 12/16/2019 EXAM: OBSTETRIC <14 WK TRANSVAGINAL OB US TECHNIQUE: Transvaginal examination was performed for complete evaluation of the gestation as well as the maternal uterus, adnexal regions, and pelvic cul-de-sac. COMPARISON:  Ultrasound 12/16/2019 FINDINGS: Intrauterine gestational sac: None Yolk sac:  Not Visualized. Embryo:  Not Visualized. Cardiac Activity: Not Visualized. Subchorionic hemorrhage:  None visualized. Maternal uterus/adnexae: Anteverted uterus. Endometrial thickness of 9.5 mm. No intrauterine gestational sac. No fluid or debris within the endometrial canal or lower uterine segment. The right ovary measures 2.9 x 1.7 x 2.4 cm. Left ovary measures 3.0 x 1.9 x 2.6 cm. No adnexal masses. A trace amount of simple free fluid is noted within the cul-de-sac. IMPRESSION: Unremarkable pelvic ultrasound. No intrauterine gestational sac. No evidence of retained products of conception. Electronically  Signed   By: Davina Poke D.O.   On: 12/28/2019 09:02    Assessment & Plan:  1. Miscarriage - SAB resolved, confirmed with [place] on 12/28/19  2. Patient desires pregnancy - Discussed optimization of maternal physical, mental, and emotional health - Discussed diet and exercise - Recommended the patient take prenatal vitamins if trying to conceive - Prenatal Vit-Fe Fumarate-FA (PRENATAL VITAMINS) 28-0.8 MG TABS; Take 1 tablet by mouth daily.  Dispense: 30 tablet; Refill: 9  Routine preventative health maintenance measures emphasized. Please refer to After Visit Summary for other counseling recommendations.   Return for August Pap/GYN Annual.  Total face-to-face time with patient: 13 minutes. Over 50% of encounter was spent on counseling and coordination of  care.  Merilyn Baba, DO OB Fellow, Faculty Practice 01/10/2020 10:34 AM

## 2020-01-20 ENCOUNTER — Other Ambulatory Visit: Payer: Self-pay

## 2020-01-20 ENCOUNTER — Ambulatory Visit (INDEPENDENT_AMBULATORY_CARE_PROVIDER_SITE_OTHER): Payer: BC Managed Care – PPO | Admitting: *Deleted

## 2020-01-20 DIAGNOSIS — Z3202 Encounter for pregnancy test, result negative: Secondary | ICD-10-CM | POA: Diagnosis not present

## 2020-01-20 DIAGNOSIS — Z3201 Encounter for pregnancy test, result positive: Secondary | ICD-10-CM

## 2020-01-20 DIAGNOSIS — Z3490 Encounter for supervision of normal pregnancy, unspecified, unspecified trimester: Secondary | ICD-10-CM

## 2020-01-20 DIAGNOSIS — Z3200 Encounter for pregnancy test, result unknown: Secondary | ICD-10-CM

## 2020-01-20 DIAGNOSIS — Z3491 Encounter for supervision of normal pregnancy, unspecified, first trimester: Secondary | ICD-10-CM

## 2020-01-20 LAB — POCT PREGNANCY, URINE: Preg Test, Ur: POSITIVE — AB

## 2020-01-20 NOTE — Progress Notes (Signed)
States here for pregnancy test , had a miscarriage last month. Test today was positive although faint . States has not had a period since her miscarriage. Has had unprotectected intercourse. Reviewed chart with recent labs, ultrasound, assessment with Dr. Ilda Basset . Non stat Bhcg ordered today. Will order [place] if bhcg has risen . Explained plan to patient and also if she has severe pain, heavy bleeding to go to hospital. She voices understanding. Bernisha Verma,RN

## 2020-01-21 ENCOUNTER — Other Ambulatory Visit: Payer: Self-pay | Admitting: Obstetrics and Gynecology

## 2020-01-21 DIAGNOSIS — Z3201 Encounter for pregnancy test, result positive: Secondary | ICD-10-CM

## 2020-01-21 LAB — BETA HCG QUANT (REF LAB): hCG Quant: 37 m[IU]/mL

## 2020-01-23 ENCOUNTER — Telehealth (INDEPENDENT_AMBULATORY_CARE_PROVIDER_SITE_OTHER): Payer: BC Managed Care – PPO | Admitting: Lactation Services

## 2020-01-23 DIAGNOSIS — O021 Missed abortion: Secondary | ICD-10-CM

## 2020-01-23 NOTE — Telephone Encounter (Signed)
Called patient to give her results of her Hcg levels and need for follow up appt. Patient was asked to come in today, she reports she is not able to come in today but can come in tomorrow.   Message to front desk to call patient to schedule follow up non stat beta Hcg.

## 2020-01-23 NOTE — Telephone Encounter (Signed)
-----   Message from Aletha Halim, MD sent at 01/21/2020 11:26 AM EDT ----- Please have her come in on Monday June 28th for a non stat beta hcg. Order is in. thanks

## 2020-01-24 ENCOUNTER — Other Ambulatory Visit: Payer: Self-pay

## 2020-01-24 ENCOUNTER — Ambulatory Visit: Payer: BC Managed Care – PPO

## 2020-01-24 DIAGNOSIS — Z3201 Encounter for pregnancy test, result positive: Secondary | ICD-10-CM | POA: Diagnosis not present

## 2020-01-24 DIAGNOSIS — R410 Disorientation, unspecified: Secondary | ICD-10-CM | POA: Diagnosis not present

## 2020-01-24 DIAGNOSIS — R413 Other amnesia: Secondary | ICD-10-CM | POA: Diagnosis not present

## 2020-01-25 ENCOUNTER — Telehealth: Payer: Self-pay | Admitting: Obstetrics and Gynecology

## 2020-01-25 DIAGNOSIS — Z3201 Encounter for pregnancy test, result positive: Secondary | ICD-10-CM

## 2020-01-25 LAB — BETA HCG QUANT (REF LAB): hCG Quant: 36 m[IU]/mL

## 2020-01-25 NOTE — Telephone Encounter (Signed)
GYN Note Patient called at [phone] and d/w her re: beta levels. I told her that I believe that her beta is from the prior pregnancy that is slowly going down, for an unknown reason and not from a new pregnancy. She did state that it feels like she started her period today. I told her I recommended repeating her level with another non stat beta hcg in a week. Pt is amenable to plan. Request sent to front desk for appointment.   Results for Natasha [name], Natasha [name] (MRN [id number]) as of 01/25/2020 12:07  Ref. Range 12/21/2019 14:24 12/28/2019 08:32 01/20/2020 11:12 01/20/2020 11:25 01/24/2020 13:11  hCG Quant Latest Units: mIU/mL 75   37 36    Durene Romans MD Attending Center for [employer] (Faculty Practice) 01/25/2020 Time: 1206pm

## 2020-01-31 ENCOUNTER — Other Ambulatory Visit: Payer: Self-pay

## 2020-01-31 ENCOUNTER — Other Ambulatory Visit: Payer: BC Managed Care – PPO

## 2020-01-31 DIAGNOSIS — O021 Missed abortion: Secondary | ICD-10-CM

## 2020-01-31 NOTE — Addendum Note (Signed)
Addended by: Annabell Howells on: 01/31/2020 08:23 AM   Modules accepted: Orders

## 2020-01-31 NOTE — Progress Notes (Signed)
Patient was assessed and managed by nursing staff during this encounter. I have reviewed the chart and agree with the documentation and plan. I have also made any necessary editorial changes.  Aletha Halim, MD 01/31/2020 3:43 PM

## 2020-02-01 LAB — BETA HCG QUANT (REF LAB): hCG Quant: <1 m[IU]/mL

## 2020-02-28 ENCOUNTER — Ambulatory Visit (INDEPENDENT_AMBULATORY_CARE_PROVIDER_SITE_OTHER): Payer: BC Managed Care – PPO | Admitting: Student

## 2020-02-28 ENCOUNTER — Other Ambulatory Visit: Payer: Self-pay

## 2020-02-28 ENCOUNTER — Other Ambulatory Visit (HOSPITAL_COMMUNITY)
Admission: RE | Admit: 2020-02-28 | Discharge: 2020-02-28 | Disposition: A | Discharge status: Home / Self Care | Payer: BC Managed Care – PPO | Source: Ambulatory Visit | Attending: Student | Admitting: Student

## 2020-02-28 ENCOUNTER — Encounter: Payer: Self-pay | Admitting: Student

## 2020-02-28 VITALS — BP 116/71 | HR 86 | Wt 142.5 lb

## 2020-02-28 DIAGNOSIS — Z01419 Encounter for gynecological examination (general) (routine) without abnormal findings: Secondary | ICD-10-CM | POA: Diagnosis present

## 2020-02-28 DIAGNOSIS — Z3201 Encounter for pregnancy test, result positive: Secondary | ICD-10-CM

## 2020-02-28 LAB — POCT PREGNANCY, URINE: Preg Test, Ur: POSITIVE — AB

## 2020-02-28 NOTE — Progress Notes (Signed)
  History:  Ms. Natasha [name] is a 34 y.o. [id number] who presents to clinic today for  Annual and pregnancy test. Last LMP was June 30th; feeling fine. Missed her July period. Had a miscarriage in May. Took pregnancy early and it was faintly positive. Denies any vaginal itching, odor, vaginal pain.   The following portions of the patient's history were reviewed and updated as appropriate: allergies, current medications, family history, past medical history, social history, past surgical history and problem list.  Review of Systems:  Review of Systems  Constitutional: Negative.   HENT: Negative.   Eyes: Negative.   Respiratory: Negative.   Cardiovascular: Negative.   Gastrointestinal: Negative.   Genitourinary: Negative.   Musculoskeletal: Negative.   Skin: Negative.   Neurological: Negative.       Objective:  Physical Exam BP 116/71   Pulse 86   Wt 142 lb 8 oz (64.6 kg)   LMP 01/25/2020 (Exact Date)   Breastfeeding No   BMI 26.93 kg/m  Physical Exam HENT:     Mouth/Throat:     Mouth: Mucous membranes are moist.  Cardiovascular:     Rate and Rhythm: Normal rate.     Pulses: Normal pulses.  Pulmonary:     Effort: Pulmonary effort is normal.  Abdominal:     General: Abdomen is flat.  Musculoskeletal:        General: Normal range of motion.  Skin:    General: Skin is warm.  Neurological:     Mental Status: She is alert.   Breast exam benign: No lumps, masses or tenderness.  NEFG: white clumpy discharge in the vagina, vaginal walls are pink with no lesions, no CMT, no suprapubic pain.      Labs and Imaging Results for orders placed or performed in visit on 02/28/20 (from the past 24 hour(s))  Pregnancy, urine POC     Status: Abnormal   Collection Time: 02/28/20  8:50 AM  Result Value Ref Range   Preg Test, Ur POSITIVE (A) NEGATIVE    No results found.   Assessment & Plan:  1. Encounter for well woman exam with routine gynecological exam  - Cytology - PAP(  [place]) -UPT was faintly positive today in clinic; will have patient take another pregnancy test in one week. If positive, will make NOB appt -Take Monistat OTC if she develops itching or odor that indicates developing yeast infection.   Approximately 30 minutes of total time was spent with this patient on care and counseling.   [place], [place] 02/28/2020 9:02 AM

## 2020-03-02 LAB — CYTOLOGY - PAP
Adequacy: ABSENT
Comment: NEGATIVE
Diagnosis: NEGATIVE
High risk HPV: NEGATIVE

## 2020-04-05 ENCOUNTER — Other Ambulatory Visit: Payer: Self-pay

## 2020-04-05 ENCOUNTER — Ambulatory Visit (INDEPENDENT_AMBULATORY_CARE_PROVIDER_SITE_OTHER): Payer: BC Managed Care – PPO

## 2020-04-05 DIAGNOSIS — Z3201 Encounter for pregnancy test, result positive: Secondary | ICD-10-CM

## 2020-04-05 DIAGNOSIS — Z3200 Encounter for pregnancy test, result unknown: Secondary | ICD-10-CM

## 2020-04-05 LAB — POCT PREGNANCY, URINE: Preg Test, Ur: POSITIVE — AB

## 2020-04-05 NOTE — Patient Instructions (Signed)

## 2020-04-05 NOTE — Progress Notes (Signed)
Pt here today for UPT; result is positive. Result given to pt. Pt reports first positive home UPT was this AM. Reports LMP of 03/01/20; pt is 5w today with EDD of 12/06/20. Denies any vaginal bleeding or pain. Medications and allergies reviewed with patient; list of medications safe to take during pregnancy listed in AVS.   Pt would like to receive prenatal care in our office. Reviewed OB history significant for 3 preterm deliveries; pt to be scheduled with MD. [facility] office to schedule appts.    Apolonio Schneiders RN 04/05/20

## 2020-04-29 ENCOUNTER — Ambulatory Visit (HOSPITAL_COMMUNITY)
Admission: RE | Admit: 2020-04-29 | Discharge: 2020-04-29 | Disposition: A | Discharge status: Home / Self Care | Payer: BC Managed Care – PPO | Source: Ambulatory Visit | Attending: Emergency Medicine | Admitting: Emergency Medicine

## 2020-04-29 ENCOUNTER — Encounter (HOSPITAL_COMMUNITY): Payer: Self-pay

## 2020-04-29 ENCOUNTER — Other Ambulatory Visit: Payer: Self-pay

## 2020-04-29 VITALS — BP 111/69 | HR 86 | Temp 99.2°F | Resp 16

## 2020-04-29 DIAGNOSIS — H66012 Acute suppurative otitis media with spontaneous rupture of ear drum, left ear: Secondary | ICD-10-CM | POA: Diagnosis not present

## 2020-04-29 MED ORDER — AMOXICILLIN 500 MG PO CAPS
500.0000 mg | ORAL_CAPSULE | Freq: Three times a day (TID) | ORAL | 0 refills | Status: AC
Start: 2020-04-29 — End: 2020-05-06

## 2020-04-29 NOTE — ED Triage Notes (Signed)
Pt c/o hitting head two days ago on the door and is now experiencing left ear pain with drainage of discharge and blood. Denies dizziness at time. Pt is approx [redacted] weeks pregnant.

## 2020-04-29 NOTE — Discharge Instructions (Signed)
Amoxicillin 3 times daily for 1 week Tylenol for pain Keep ear clean and dry Follow up with ENT- contact below Return for any concerns

## 2020-04-30 NOTE — ED Provider Notes (Addendum)
Natasha [name]    CSN: [id number] Arrival date & time: 04/29/20  1545      History   Chief Complaint Chief Complaint  Patient presents with  . Otalgia    HPI Natasha [name] is a 34 y.o. female presenting today for evaluation of ear pain. Reports 2 days ago hit head on side of door and since has developed pain, bloody drainage and slight change in hearing. Denies associated URI symptoms. Denies fevers. Patient is [redacted] weeks pregnant.   HPI  Past Medical History:  Diagnosis Date  . History of preterm delivery 01/19/2017   3 preterm deliveries Had Makena 2012 with del at 36 wks Agrees to [facility] this time  . Hypertension     Patient Active Problem List   Diagnosis Date Noted  . SVD (spontaneous vaginal delivery) 06/08/2017  . Preterm labor 06/07/2017  . Gestational diabetes 06/03/2017  . History of preterm delivery 01/19/2017  . 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 IUD REMOVAL    . LAPAROSCOPY  02/06/2011   Procedure: LAPAROSCOPY OPERATIVE;  Surgeon: Osborne Oman, MD;  Location: [place] [facility];  Service: Gynecology;  Laterality: N/A;  Removal of Mirena IUD from sigmoid serosa, enterolysis, proctoscopy  . PROCTOSCOPY  02/06/2011   Procedure: PROCTOSCOPY;  Surgeon: Adin Hector, MD;  Location: [place] [facility];  Service: General;  Laterality: N/A;    OB History    Gravida  5   Para  4   Term  0   Preterm  4   AB  1   Living  4     SAB  1   TAB  0   Ectopic  0   Multiple  0   Live Births  4            Home Medications    Prior to Admission medications   Medication Sig Start Date End Date Taking? Authorizing Provider  amoxicillin (AMOXIL) 500 MG capsule Take 1 capsule (500 mg total) by mouth 3 (three) times daily for 7 days. 04/29/20 05/06/20  Gaylon Melchor C, PA-C  Prenatal Vit-Fe Fumarate-FA (PRENATAL VITAMINS) 28-0.8 MG TABS Take 1 tablet by mouth daily. 01/10/20   [name], [name], DO     Family History Family History  Problem Relation Age of Onset  . Hypertension Paternal Grandmother   . Hypertension Paternal Grandfather   . Hypertension Maternal Grandmother   . Hypertension Maternal Grandfather     Social History Social History   Tobacco Use  . Smoking status: Never Smoker  . Smokeless tobacco: Never Used  Vaping Use  . Vaping Use: Never used  Substance Use Topics  . Alcohol use: No  . Drug use: No     Allergies   Patient has no known allergies.   Review of Systems Review of Systems  Constitutional: Negative for activity change, appetite change, chills, fatigue and fever.  HENT: Positive for ear discharge, ear pain and hearing loss. Negative for congestion, rhinorrhea, sinus pressure, sore throat and trouble swallowing.   Eyes: Negative for discharge and redness.  Respiratory: Negative for cough, chest tightness and shortness of breath.   Cardiovascular: Negative for chest pain.  Gastrointestinal: Negative for abdominal pain, diarrhea, nausea and vomiting.  Musculoskeletal: Negative for myalgias.  Skin: Negative for rash.  Neurological: Negative for dizziness, light-headedness and headaches.     Physical Exam Triage Vital Signs ED Triage Vitals [04/29/20 1617]  Enc Vitals Group  BP 111/69     Pulse Rate 86     Resp 16     Temp 99.2 F (37.3 C)     Temp Source Oral     SpO2 99 %     Weight      Height      Head Circumference      Peak Flow      Pain Score 7     Pain Loc      Pain Edu?      Excl. in [place]?    No data found.  Updated Vital Signs BP 111/69 (BP Location: Left Arm)   Pulse 86   Temp 99.2 F (37.3 C) (Oral)   Resp 16   SpO2 99%   Visual Acuity Right Eye Distance:   Left Eye Distance:   Bilateral Distance:    Right Eye Near:   Left Eye Near:    Bilateral Near:     Physical Exam Vitals and nursing note reviewed.  Constitutional:      Appearance: She is well-developed.     Comments: No acute distress   HENT:     Head: Normocephalic and atraumatic.     Ears:     Comments: Left TM with perforation, appears slightly dull and erythematous, Canal also erythematous and mildly edematous    Nose: Nose normal.     Mouth/Throat:     Comments: Oral mucosa pink and moist, no tonsillar enlargement or exudate. Posterior pharynx patent and nonerythematous, no uvula deviation or swelling. Normal phonation.  Eyes:     Conjunctiva/sclera: Conjunctivae normal.  Cardiovascular:     Rate and Rhythm: Normal rate.  Pulmonary:     Effort: Pulmonary effort is normal. No respiratory distress.  Abdominal:     General: There is no distension.  Musculoskeletal:        General: Normal range of motion.     Cervical back: Neck supple.  Skin:    General: Skin is warm and dry.  Neurological:     Mental Status: She is alert and oriented to person, place, and time.      UC Treatments / Results  Labs (all labs ordered are listed, but only abnormal results are displayed) Labs Reviewed - No data to display  EKG   Radiology No results found.  Procedures Procedures (including critical care time)  Medications Ordered in UC Medications - No data to display  Initial Impression / Assessment and Plan / UC Course  I have reviewed the triage vital signs and the nursing notes.  Pertinent labs & imaging results that were available during my care of the patient were reviewed by me and considered in my medical decision making (see chart for details).     Ear exam suggestive of otitis media/externa with perforation, questionable traumatic perforation. [redacted] weeks pregnant, treating with oral amoxicillin. Recommended follow up with ENT.   Discussed strict return precautions. Patient verbalized understanding and is agreeable with plan.  Final Clinical Impressions(s) / UC Diagnoses   Final diagnoses:  Non-recurrent acute suppurative otitis media of left ear with spontaneous rupture of tympanic membrane      Discharge Instructions     Amoxicillin 3 times daily for 1 week Tylenol for pain Keep ear clean and dry Follow up with ENT- contact below Return for any concerns   ED Prescriptions    Medication Sig Dispense Auth. Provider   amoxicillin (AMOXIL) 500 MG capsule Take 1 capsule (500 mg total) by mouth 3 (three) times  daily for 7 days. 21 capsule Lachrista Heslin, [place] C, PA-C     PDMP not reviewed this encounter.   Joneen Caraway [place] C, PA-C 04/30/20 1804    Janith Lima, PA-C 04/30/20 1804

## 2020-05-02 DIAGNOSIS — H7202 Central perforation of tympanic membrane, left ear: Secondary | ICD-10-CM | POA: Diagnosis not present

## 2020-05-02 DIAGNOSIS — H9212 Otorrhea, left ear: Secondary | ICD-10-CM | POA: Insufficient documentation

## 2020-05-03 ENCOUNTER — Other Ambulatory Visit (HOSPITAL_COMMUNITY)
Admission: RE | Admit: 2020-05-03 | Discharge: 2020-05-03 | Disposition: A | Discharge status: Home / Self Care | Payer: BC Managed Care – PPO | Source: Ambulatory Visit | Attending: Obstetrics & Gynecology | Admitting: Obstetrics & Gynecology

## 2020-05-03 ENCOUNTER — Encounter: Payer: Self-pay | Admitting: Family Medicine

## 2020-05-03 ENCOUNTER — Ambulatory Visit (INDEPENDENT_AMBULATORY_CARE_PROVIDER_SITE_OTHER): Payer: BC Managed Care – PPO | Admitting: Obstetrics & Gynecology

## 2020-05-03 ENCOUNTER — Encounter: Payer: Self-pay | Admitting: Obstetrics & Gynecology

## 2020-05-03 ENCOUNTER — Other Ambulatory Visit: Payer: Self-pay

## 2020-05-03 VITALS — BP 103/61 | HR 75 | Wt 145.0 lb

## 2020-05-03 DIAGNOSIS — O09529 Supervision of elderly multigravida, unspecified trimester: Secondary | ICD-10-CM

## 2020-05-03 DIAGNOSIS — O09299 Supervision of pregnancy with other poor reproductive or obstetric history, unspecified trimester: Secondary | ICD-10-CM | POA: Insufficient documentation

## 2020-05-03 DIAGNOSIS — O09899 Supervision of other high risk pregnancies, unspecified trimester: Secondary | ICD-10-CM

## 2020-05-03 DIAGNOSIS — Z8632 Personal history of gestational diabetes: Secondary | ICD-10-CM

## 2020-05-03 DIAGNOSIS — O0990 Supervision of high risk pregnancy, unspecified, unspecified trimester: Secondary | ICD-10-CM | POA: Insufficient documentation

## 2020-05-03 DIAGNOSIS — Z3A09 9 weeks gestation of pregnancy: Secondary | ICD-10-CM

## 2020-05-03 HISTORY — DX: Supervision of other high risk pregnancies, unspecified trimester: O09.899

## 2020-05-03 NOTE — Patient Instructions (Signed)
First Trimester of Pregnancy The first trimester of pregnancy is from week 1 until the end of week 13 (months 1 through 3). A week after a sperm fertilizes an egg, the egg will implant on the wall of the uterus. This embryo will begin to develop into a baby. Genes from you and your partner will form the baby. The female genes will determine whether the baby will be a boy or a girl. At 6-8 weeks, the eyes and face will be formed, and the heartbeat can be seen on ultrasound. At the end of 12 weeks, all the baby's organs will be formed. Now that you are pregnant, you will want to do everything you can to have a healthy baby. Two of the most important things are to get good prenatal care and to follow your health care provider's instructions. Prenatal care is all the medical care you receive before the baby's birth. This care will help prevent, find, and treat any problems during the pregnancy and childbirth. Body changes during your first trimester Your body goes through many changes during pregnancy. The changes vary from woman to woman.  You may gain or lose a couple of pounds at first.  You may feel sick to your stomach (nauseous) and you may throw up (vomit). If the vomiting is uncontrollable, call your health care provider.  You may tire easily.  You may develop headaches that can be relieved by medicines. All medicines should be approved by your health care provider.  You may urinate more often. Painful urination may mean you have a bladder infection.  You may develop heartburn as a result of your pregnancy.  You may develop constipation because certain hormones are causing the muscles that push stool through your intestines to slow down.  You may develop hemorrhoids or swollen veins (varicose veins).  Your breasts may begin to grow larger and become tender. Your nipples may stick out more, and the tissue that surrounds them (areola) may become darker.  Your gums may bleed and may be  sensitive to brushing and flossing.  Dark spots or blotches (chloasma, mask of pregnancy) may develop on your face. This will likely fade after the baby is born.  Your menstrual periods will stop.  You may have a loss of appetite.  You may develop cravings for certain kinds of food.  You may have changes in your emotions from day to day, such as being excited to be pregnant or being concerned that something may go wrong with the pregnancy and baby.  You may have more vivid and strange dreams.  You may have changes in your hair. These can include thickening of your hair, rapid growth, and changes in texture. Some women also have hair loss during or after pregnancy, or hair that feels dry or thin. Your hair will most likely return to normal after your baby is born. What to expect at prenatal visits During a routine prenatal visit:  You will be weighed to make sure you and the baby are growing normally.  Your blood pressure will be taken.  Your abdomen will be measured to track your baby's growth.  The fetal heartbeat will be listened to between weeks 10 and 14 of your pregnancy.  Test results from any previous visits will be discussed. Your health care provider may ask you:  How you are feeling.  If you are feeling the baby move.  If you have had any abnormal symptoms, such as leaking fluid, bleeding, severe headaches, or abdominal   cramping.  If you are using any tobacco products, including cigarettes, chewing tobacco, and electronic cigarettes.  If you have any questions. Other tests that may be performed during your first trimester include:  Blood tests to find your blood type and to check for the presence of any previous infections. The tests will also be used to check for low iron levels (anemia) and protein on red blood cells (Rh antibodies). Depending on your risk factors, or if you previously had diabetes during pregnancy, you may have tests to check for high blood sugar  that affects pregnant women (gestational diabetes).  Urine tests to check for infections, diabetes, or protein in the urine.  An ultrasound to confirm the proper growth and development of the baby.  Fetal screens for spinal cord problems (spina bifida) and Down syndrome.  HIV (human immunodeficiency virus) testing. Routine prenatal testing includes screening for HIV, unless you choose not to have this test.  You may need other tests to make sure you and the baby are doing well. Follow these instructions at home: Medicines  Follow your health care provider's instructions regarding medicine use. Specific medicines may be either safe or unsafe to take during pregnancy.  Take a prenatal vitamin that contains at least 600 micrograms (mcg) of folic acid.  If you develop constipation, try taking a stool softener if your health care provider approves. Eating and drinking   Eat a balanced diet that includes fresh fruits and vegetables, whole grains, good sources of protein such as meat, eggs, or tofu, and low-fat dairy. Your health care provider will help you determine the amount of weight gain that is right for you.  Avoid raw meat and uncooked cheese. These carry germs that can cause birth defects in the baby.  Eating four or five small meals rather than three large meals a day may help relieve nausea and vomiting. If you start to feel nauseous, eating a few soda crackers can be helpful. Drinking liquids between meals, instead of during meals, also seems to help ease nausea and vomiting.  Limit foods that are high in fat and processed sugars, such as fried and sweet foods.  To prevent constipation: ? Eat foods that are high in fiber, such as fresh fruits and vegetables, whole grains, and beans. ? Drink enough fluid to keep your urine clear or pale yellow. Activity  Exercise only as directed by your health care provider. Most women can continue their usual exercise routine during  pregnancy. Try to exercise for 30 minutes at least 5 days a week. Exercising will help you: ? Control your weight. ? Stay in shape. ? Be prepared for labor and delivery.  Experiencing pain or cramping in the lower abdomen or lower back is a good sign that you should stop exercising. Check with your health care provider before continuing with normal exercises.  Try to avoid standing for long periods of time. Move your legs often if you must stand in one place for a long time.  Avoid heavy lifting.  Wear low-heeled shoes and practice good posture.  You may continue to have sex unless your health care provider tells you not to. Relieving pain and discomfort  Wear a good support bra to relieve breast tenderness.  Take warm sitz baths to soothe any pain or discomfort caused by hemorrhoids. Use hemorrhoid cream if your health care provider approves.  Rest with your legs elevated if you have leg cramps or low back pain.  If you develop varicose veins in   your legs, wear support hose. Elevate your feet for 15 minutes, 3-4 times a day. Limit salt in your diet. Prenatal care  Schedule your prenatal visits by the twelfth week of pregnancy. They are usually scheduled monthly at first, then more often in the last 2 months before delivery.  Write down your questions. Take them to your prenatal visits.  Keep all your prenatal visits as told by your health care provider. This is important. Safety  Wear your seat belt at all times when driving.  Make a list of emergency phone numbers, including numbers for family, friends, the hospital, and police and fire departments. General instructions  Ask your health care provider for a referral to a local prenatal education class. Begin classes no later than the beginning of month 6 of your pregnancy.  Ask for help if you have counseling or nutritional needs during pregnancy. Your health care provider can offer advice or refer you to specialists for help  with various needs.  Do not use hot tubs, steam rooms, or saunas.  Do not douche or use tampons or scented sanitary pads.  Do not cross your legs for long periods of time.  Avoid cat litter boxes and soil used by cats. These carry germs that can cause birth defects in the baby and possibly loss of the fetus by miscarriage or stillbirth.  Avoid all smoking, herbs, alcohol, and medicines not prescribed by your health care provider. Chemicals in these products affect the formation and growth of the baby.  Do not use any products that contain nicotine or tobacco, such as cigarettes and e-cigarettes. If you need help quitting, ask your health care provider. You may receive counseling support and other resources to help you quit.  Schedule a dentist appointment. At home, brush your teeth with a soft toothbrush and be gentle when you floss. Contact a health care provider if:  You have dizziness.  You have mild pelvic cramps, pelvic pressure, or nagging pain in the abdominal area.  You have persistent nausea, vomiting, or diarrhea.  You have a bad smelling vaginal discharge.  You have pain when you urinate.  You notice increased swelling in your face, hands, legs, or ankles.  You are exposed to fifth disease or chickenpox.  You are exposed to German measles (rubella) and have never had it. Get help right away if:  You have a fever.  You are leaking fluid from your vagina.  You have spotting or bleeding from your vagina.  You have severe abdominal cramping or pain.  You have rapid weight gain or loss.  You vomit blood or material that looks like coffee grounds.  You develop a severe headache.  You have shortness of breath.  You have any kind of trauma, such as from a fall or a car accident. Summary  The first trimester of pregnancy is from week 1 until the end of week 13 (months 1 through 3).  Your body goes through many changes during pregnancy. The changes vary from  woman to woman.  You will have routine prenatal visits. During those visits, your health care provider will examine you, discuss any test results you may have, and talk with you about how you are feeling. This information is not intended to replace advice given to you by your health care provider. Make sure you discuss any questions you have with your health care provider. Document Revised: 06/26/2017 Document Reviewed: 06/25/2016 Elsevier Patient Education  2020 Elsevier Inc.  

## 2020-05-03 NOTE — Progress Notes (Addendum)
Makena Order faxed todfay-05/03/20 Next [place] on 06/25/20 @ 8:45a

## 2020-05-03 NOTE — Progress Notes (Signed)
History:   Lucine Bilski Nunziato is a 34 y.o. [id number] at [redacted]w[redacted]d by LMP being seen today for her first obstetrical visit.  Her obstetrical history is significant for advanced maternal age and history of GDM and history of four previous cesarean sections. Patient does intend to breast feed. Pregnancy history fully reviewed.  Patient reports no complaints.      HISTORY: OB History  Gravida Para Term Preterm AB Living  6 4 0 4 1 4   SAB TAB Ectopic Multiple Live Births  1 0 0 0 4    # Outcome Date GA Lbr Len/2nd Weight Sex Delivery Anes PTL Lv  6 Current           5 SAB 2021          4 Preterm 06/08/17 [redacted]w[redacted]d 02:07 / 00:02 4 lb 9.4 oz (2.08 kg) M Vag-Spont None  LIV     Name: Mehrer,BOY Allexis     Apgar1: 8  Apgar5: 8  3 Preterm 11/02/10 [redacted]w[redacted]d   F Vag-Spont  Y LIV  2 Preterm 01/23/08 [redacted]w[redacted]d  5 lb 4 oz (2.381 kg) M Vag-Spont  Y LIV  1 Preterm 03/08/01 [redacted]w[redacted]d  4 lb 5 oz (1.956 kg) F Vag-Spont  Y LIV  Last pap smear was done 02/28/2020 and was normal  Past Medical History:  Diagnosis Date  . Gestational diabetes   . History of preterm delivery 01/19/2017   3 preterm deliveries Had Makena 2012 with del at 36 wks Agrees to [facility] this time  . Hypertension   . 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 IUD REMOVAL    . LAPAROSCOPY  02/06/2011   Procedure: LAPAROSCOPY OPERATIVE;  Surgeon: Osborne Oman, MD;  Location: [facility];  Service: Gynecology;  Laterality: N/A;  Removal of Mirena IUD from sigmoid serosa, enterolysis, proctoscopy  . PROCTOSCOPY  02/06/2011   Procedure: PROCTOSCOPY;  Surgeon: Adin Hector, MD;  Location: [facility];  Service: General;  Laterality: N/A;   Family History  Problem Relation Age of Onset  . Hypertension Paternal Grandmother   . Hypertension Paternal Grandfather   . Hypertension Maternal Grandmother   . Hypertension Maternal Grandfather    Social History   Tobacco Use  . Smoking status: Never Smoker  . Smokeless  tobacco: Never Used  Vaping Use  . Vaping Use: Never used  Substance Use Topics  . Alcohol use: No  . Drug use: No   No Known Allergies Current Outpatient Medications on File Prior to Visit  Medication Sig Dispense Refill  . amoxicillin (AMOXIL) 500 MG capsule Take 1 capsule (500 mg total) by mouth 3 (three) times daily for 7 days. 21 capsule 0  . Prenatal Vit-Fe Fumarate-FA (PRENATAL VITAMINS) 28-0.8 MG TABS Take 1 tablet by mouth daily. 30 tablet 9   No current facility-administered medications on file prior to visit.    Review of Systems Pertinent items noted in HPI and remainder of comprehensive ROS otherwise negative.  Physical Exam:   Vitals:   05/03/20 1017  BP: 103/61  Pulse: 75  Weight: 145 lb (65.8 kg)     Bedside Ultrasound for FHR check: Viable intrauterine pregnancy with positive cardiac activity note, fetal heart rate 160 bpm Patient informed that the ultrasound is considered a limited obstetric ultrasound and is not intended to be a complete ultrasound exam.  Patient also informed that the ultrasound is not being completed with the intent of assessing for fetal  or placental anomalies or any pelvic abnormalities.  Explained that the purpose of today's ultrasound is to assess for fetal heart rate.  Patient acknowledges the purpose of the exam and the limitations of the study.  General: well-developed, well-nourished female in no acute distress  Breasts:  normal appearance, no masses or tenderness bilaterally  Skin: normal coloration and turgor, no rashes  Neurologic: oriented, normal, negative, normal mood  Extremities: normal strength, tone, and muscle mass, ROM of all joints is normal  HEENT PERRLA, extraocular movement intact and sclera clear, anicteric  Mouth/Teeth mucous membranes moist, pharynx normal without lesions and dental hygiene good  Neck supple and no masses  Cardiovascular: regular rate and rhythm  Respiratory:  no respiratory distress, normal  breath sounds  Abdomen: soft, non-tender; bowel sounds normal; no masses,  no organomegaly  Pelvic: deferred    Assessment:    Pregnancy: [id number] Patient Active Problem List   Diagnosis Date Noted  . Supervision of high risk pregnancy, antepartum 05/03/2020  . History of gestational diabetes in prior pregnancy, currently pregnant 05/03/2020  . History of four preterm deliveries, currently pregnant 05/03/2020  . History of postpartum depression, currently pregnant 02/21/2011     Plan:    1. History of preterm delivery, currently pregnant Has used Makena in previous pregnancy, wants to do it again. Cervical length ultrasound ordered. PTL precautions advised. - [place] MFM OB DETAIL +14 WK; Future - [place] MFM OB TRANSVAGINAL; Future  2. History of gestational diabetes in prior pregnancy, currently pregnant May need early GTT based on results of CMET and AIC - Comprehensive metabolic panel - Hemoglobin A1c  3. [redacted] weeks gestation of pregnancy 4. Supervision of high risk pregnancy, antepartum - CBC/D/Plt+RPR+Rh+ABO+Rub Ab... - Culture, OB Urine - TSH - [place] MFM OB DETAIL +14 WK; Future - Protein / creatinine ratio, urine - Babyscripts Schedule Optimization - Enroll Patient in Babyscripts - [place] MFM OB TRANSVAGINAL; Future - Cervicovaginal ancillary only( [place])  Initial labs drawn. Continue prenatal vitamins. Problem list reviewed and updated. Genetic Screening discussed, NIPS: requested. To be drawn next week. Ultrasound discussed; fetal anatomic survey: ordered. Anticipatory guidance about prenatal visits given including labs, ultrasounds, and testing. Discussed usage of Babyscripts and virtual visits as additional source of managing and completing prenatal visits in midst of coronavirus and pandemic.   Encouraged to complete MyChart Registration for her ability to review results, send requests, and have questions addressed.  The nature of [place] for [facility]  Healthcare/Faculty Practice with multiple MDs and Advanced Practice Providers was explained to patient; also emphasized that residents, students are part of our team. Routine obstetric precautions reviewed. Encouraged to seek out care at office or emergency room [facility] preferred) for urgent and/or emergent concerns. Return in about 4 weeks (around 05/31/2020) for OFFICE OB Visit, genetic testing.     Verita Schneiders, MD, [place] for [employer], [place]

## 2020-05-04 LAB — CBC/D/PLT+RPR+RH+ABO+RUB AB...
Antibody Screen: NEGATIVE
Basophils Absolute: 0 10*3/uL (ref 0.0–0.2)
Basos: 0 %
EOS (ABSOLUTE): 0.3 10*3/uL (ref 0.0–0.4)
Eos: 4 %
HCV Ab: <0.1 s/co ratio (ref 0.0–0.9)
HIV Screen 4th Generation wRfx: NONREACTIVE
Hematocrit: 37 % (ref 34.0–46.6)
Hemoglobin: 12.6 g/dL (ref 11.1–15.9)
Hepatitis B Surface Ag: NEGATIVE
Immature Grans (Abs): 0 10*3/uL (ref 0.0–0.1)
Immature Granulocytes: 0 %
Lymphocytes Absolute: 1.6 10*3/uL (ref 0.7–3.1)
Lymphs: 22 %
MCH: 31 pg (ref 26.6–33.0)
MCHC: 34.1 g/dL (ref 31.5–35.7)
MCV: 91 fL (ref 79–97)
Monocytes Absolute: 0.6 10*3/uL (ref 0.1–0.9)
Monocytes: 8 %
Neutrophils Absolute: 4.7 10*3/uL (ref 1.4–7.0)
Neutrophils: 66 %
Platelets: 278 10*3/uL (ref 150–450)
RBC: 4.07 x10E6/uL (ref 3.77–5.28)
RDW: 12.9 % (ref 11.7–15.4)
RPR Ser Ql: NONREACTIVE
Rh Factor: POSITIVE
Rubella Antibodies, IGG: 4.31 index (ref >0.99)
WBC: 7.2 10*3/uL (ref 3.4–10.8)

## 2020-05-04 LAB — COMPREHENSIVE METABOLIC PANEL
ALT: 14 IU/L (ref 0–32)
AST: 12 IU/L (ref 0–40)
Albumin/Globulin Ratio: 1.7 (ref 1.2–2.2)
Albumin: 4.2 g/dL (ref 3.8–4.8)
Alkaline Phosphatase: 56 IU/L (ref 44–121)
BUN/Creatinine Ratio: 13 (ref 9–23)
BUN: 8 mg/dL (ref 6–20)
Bilirubin Total: 0.2 mg/dL (ref 0.0–1.2)
CO2: 22 mmol/L (ref 20–29)
Calcium: 9.6 mg/dL (ref 8.7–10.2)
Chloride: 100 mmol/L (ref 96–106)
Creatinine, Ser: 0.6 mg/dL (ref 0.57–1.00)
GFR calc Af Amer: 136 mL/min/{1.73_m2} (ref >59)
GFR calc non Af Amer: 118 mL/min/{1.73_m2} (ref >59)
Globulin, Total: 2.5 g/dL (ref 1.5–4.5)
Glucose: 84 mg/dL (ref 65–99)
Potassium: 4.2 mmol/L (ref 3.5–5.2)
Sodium: 137 mmol/L (ref 134–144)
Total Protein: 6.7 g/dL (ref 6.0–8.5)

## 2020-05-04 LAB — HEMOGLOBIN A1C
Est. average glucose Bld gHb Est-mCnc: 123 mg/dL
Hgb A1c MFr Bld: 5.9 % — ABNORMAL HIGH (ref 4.8–5.6)

## 2020-05-04 LAB — HCV INTERPRETATION

## 2020-05-04 LAB — PROTEIN / CREATININE RATIO, URINE
Creatinine, Urine: 90.5 mg/dL
Protein, Ur: 12.9 mg/dL
Protein/Creat Ratio: 143 mg/g creat (ref 0–200)

## 2020-05-04 LAB — CERVICOVAGINAL ANCILLARY ONLY
Chlamydia: NEGATIVE
Comment: NEGATIVE
Comment: NORMAL
Neisseria Gonorrhea: NEGATIVE

## 2020-05-04 LAB — TSH: TSH: 1.23 u[IU]/mL (ref 0.450–4.500)

## 2020-05-05 LAB — URINE CULTURE, OB REFLEX

## 2020-05-05 LAB — CULTURE, OB URINE

## 2020-05-31 ENCOUNTER — Other Ambulatory Visit: Payer: Self-pay

## 2020-05-31 ENCOUNTER — Ambulatory Visit (INDEPENDENT_AMBULATORY_CARE_PROVIDER_SITE_OTHER): Payer: BC Managed Care – PPO

## 2020-05-31 ENCOUNTER — Encounter: Payer: Self-pay | Admitting: Obstetrics and Gynecology

## 2020-05-31 VITALS — BP 108/69 | HR 86 | Wt 147.4 lb

## 2020-05-31 DIAGNOSIS — O09299 Supervision of pregnancy with other poor reproductive or obstetric history, unspecified trimester: Secondary | ICD-10-CM

## 2020-05-31 DIAGNOSIS — O0990 Supervision of high risk pregnancy, unspecified, unspecified trimester: Secondary | ICD-10-CM

## 2020-05-31 DIAGNOSIS — O09899 Supervision of other high risk pregnancies, unspecified trimester: Secondary | ICD-10-CM

## 2020-05-31 DIAGNOSIS — Z3481 Encounter for supervision of other normal pregnancy, first trimester: Secondary | ICD-10-CM | POA: Diagnosis not present

## 2020-05-31 DIAGNOSIS — D563 Thalassemia minor: Secondary | ICD-10-CM

## 2020-05-31 DIAGNOSIS — Z315 Encounter for genetic counseling: Secondary | ICD-10-CM | POA: Diagnosis not present

## 2020-05-31 DIAGNOSIS — O09529 Supervision of elderly multigravida, unspecified trimester: Secondary | ICD-10-CM | POA: Insufficient documentation

## 2020-05-31 DIAGNOSIS — Z8632 Personal history of gestational diabetes: Secondary | ICD-10-CM

## 2020-05-31 DIAGNOSIS — Z3A13 13 weeks gestation of pregnancy: Secondary | ICD-10-CM

## 2020-05-31 MED ORDER — BLOOD PRESSURE KIT DEVI: 1.0000 | Freq: Once | 0 refills | Status: AC

## 2020-05-31 NOTE — Patient Instructions (Signed)
Hydroxyprogesterone caproate injection for pregnancy What is this medicine? HYDROXYPROGESTERONE (hye drox ee proe JES ter one) is a female hormone. This medicine is used in women who are pregnant and who have delivered a baby too early (preterm) in the past. It helps lower the risk of having a preterm baby again. This medicine may be used for other purposes; ask your health care provider or pharmacist if you have questions. COMMON BRAND NAME(S): Makena What should I tell my health care provider before I take this medicine? They need to know if you have any of these conditions:  breast, cervical, uterine, or vaginal cancer  depression  diabetes or prediabetes  heart disease  high blood pressure  history of blood clots  kidney disease  liver disease  lung or breathing disease, like asthma  migraine headaches  seizures  vaginal bleeding  an unusual or allergic reaction to hydroxyprogesterone, other hormones, castor oil, benzyl alcohol, other medicines, foods, dyes, or preservatives  breast-feeding How should I use this medicine? This medicine is for injection into a muscle or under the skin. You will receive an injection once every week (every 7 days) as directed during your pregnancy. It is given by a health care professional in a hospital or clinic setting. Talk to your pediatrician regarding the use of this medicine in children. While this drug may be prescribed for pregnant women as young as 16 years, precautions do apply. Overdosage: If you think you have taken too much of this medicine contact a poison control center or emergency room at once. NOTE: This medicine is only for you. Do not share this medicine with others. What if I miss a dose? It is important not to miss your dose. Call your doctor or health care professional if you are unable to keep an appointment. What may interact with this medicine? Significant interactions are not expected. This list may not  describe all possible interactions. Give your health care provider a list of all the medicines, herbs, non-prescription drugs, or dietary supplements you use. Also tell them if you smoke, drink alcohol, or use illegal drugs. Some items may interact with your medicine. What should I watch for while using this medicine? Your pregnancy will be monitored carefully while you are receiving this medicine. What side effects may I notice from receiving this medicine? Side effects that you should report to your doctor or health care professional as soon as possible:  allergic reactions like skin rash, itching or hives, swelling of the face, lips, or tongue  breathing problems  depressed mood  increase in blood pressure  increased hunger or thirst  increased urination  signs and symptoms of a blood clot such as breathing problems; changes in vision; chest pain; severe, sudden headache; pain, swelling, warmth in the leg; trouble speaking; sudden numbness or weakness of the face, arm or leg  unusually weak or tired  unusual vaginal bleeding  yellowing of the eyes or skin Side effects that usually do not require medical attention (report to your doctor or health care professional if they continue or are bothersome):  diarrhea  fluid retention and swelling  nausea  pain, redness, or irritation at site where injected This list may not describe all possible side effects. Call your doctor for medical advice about side effects. You may report side effects to FDA at 1-800-FDA-1088. Where should I keep my medicine? This drug is given in a hospital or clinic and will not be stored at home. NOTE: This sheet is a  summary. It may not cover all possible information. If you have questions about this medicine, talk to your doctor, pharmacist, or health care provider.  2020 Elsevier/Gold Standard (2017-03-29 11:14:47)

## 2020-05-31 NOTE — Progress Notes (Signed)
   HIGH-RISK PREGNANCY OFFICE VISIT  Patient name: Natasha [name] MRN [id number]  Date of birth: 02-May-1986 Chief Complaint:   Routine Prenatal Visit  Subjective:   Natasha [name] is a 34 y.o. [id number] female at 52w0dwith an Estimated Date of Delivery: 12/06/20 being seen today for ongoing management of a high-risk pregnancy aeb has History of postpartum depression, currently pregnant; Supervision of high risk pregnancy, antepartum; History of gestational diabetes in prior pregnancy, currently pregnant; History of four preterm deliveries, currently pregnant; and AMA (advanced maternal age) multigravida 35+ on their problem list.  Patient presents today without complaints. Patient denies abdominal cramping or contractions.  Patient denies vaginal concerns including abnormal discharge, leaking of fluid, and bleeding.  Contractions: Not present.  .  Movement: Absent.  Reviewed past medical,surgical, social, obstetrical and family history as well as problem list, medications and allergies.  Objective   Vitals:   05/31/20 1106  Weight: 147 lb 6.4 oz (66.9 kg)  Body mass index is 27.85 kg/m.  Total Weight Gain:5 lb 6.4 oz (2.449 kg)         Physical Examination:   General appearance: Well appearing, and in no distress  Mental status: Alert, oriented to person, place, and time  Skin: Warm & dry  Cardiovascular: Normal heart rate noted  Respiratory: Normal respiratory effort, no distress  Abdomen: Soft, gravid, nontender, AGA with Fundal height Not Assessed  Pelvic: Cervical exam deferred           Extremities: Edema: None  Fetal Status:    Movement: Absent   No results found for this or any previous visit (from the past 24 hour(s)).  Assessment & Plan:  HIGH-risk pregnancy of a 34y.o., [id number] 118w0dith an Estimated Date of Delivery: 12/06/20   1. Supervision of high risk pregnancy, antepartum -Anticipatory guidance for upcoming appts. -Reviewed need for MD visits d/t HR status and  limited availability of Female MD at this site. -Encouraged to be open to female MD for care.  2. [redacted] weeks gestation of pregnancy -Doing well. -Genetic Screening Today.  3. History of gestational diabetes in prior pregnancy, currently pregnant -Elevated HgB A1C -Plan for early glucola at next visit in 3-4 weeks.  4. History of preterm delivery, currently pregnant -[place] to [place] 3-4 weeks.    Meds:  Meds ordered this encounter  Medications  . Blood Pressure Monitoring (BLOOD PRESSURE KIT) DEVI    Sig: 1 Device by Does not apply route once for 1 dose.    Dispense:  1 each    Refill:  0    Dx: O09.90   Labs/procedures today:  Lab Orders  No laboratory test(s) ordered today     Reviewed: Preterm labor symptoms and general obstetric precautions including but not limited to vaginal bleeding, contractions, leaking of fluid and fetal movement were reviewed in detail with the patient.  All questions were answered.  Follow-up: Return in about 3 weeks (around 06/21/2020) for Early Glucola and Makena Injection, HROB.  No orders of the defined types were placed in this encounter.  JeMaryann ConnersSN, CNM 05/31/2020

## 2020-06-01 ENCOUNTER — Encounter: Payer: Self-pay | Admitting: *Deleted

## 2020-06-04 ENCOUNTER — Encounter: Payer: Self-pay | Admitting: *Deleted

## 2020-06-05 ENCOUNTER — Encounter: Payer: Self-pay | Admitting: *Deleted

## 2020-06-12 ENCOUNTER — Telehealth: Payer: Self-pay | Admitting: Lactation Services

## 2020-06-12 NOTE — Telephone Encounter (Signed)
Called patient to inform her that she is a silent carrier for Alpha Thalassemia. Reviewed calling Johnsie Cancel to set up a telephone Genetic Counseling Session to discuss results and having FOB tested as recommended. Patient questions answered. Patient to call with any questions or concerns as needed.

## 2020-06-13 ENCOUNTER — Telehealth: Payer: Self-pay | Admitting: *Deleted

## 2020-06-13 ENCOUNTER — Encounter: Payer: Self-pay | Admitting: *Deleted

## 2020-06-13 NOTE — Telephone Encounter (Signed)
Pt left VM message stating that she had a small blood clot when she used the bathroom. She requests call back to discuss

## 2020-06-14 ENCOUNTER — Telehealth: Payer: Self-pay | Admitting: Family Medicine

## 2020-06-14 NOTE — Telephone Encounter (Signed)
Returned patients call. She reports she passed a small blood clot yesterday at work with some spotting also. She has not had any other bleeding since. She had sexual intercourse a day before. She has no pain of any other vaginal discharge. Reviewed this can be normal in pregnancy after sexual intercourse and to let us know if she has any further bleeding.

## 2020-06-14 NOTE — Telephone Encounter (Signed)
Vicente Males from  [facility] [phone]   Would like a call back concerning this pt and Makena injection

## 2020-06-14 NOTE — Telephone Encounter (Signed)
Called Ana with Makena, no answer- left message including our contact information and to call back if she still needs assistance.

## 2020-06-15 DIAGNOSIS — D563 Thalassemia minor: Secondary | ICD-10-CM | POA: Insufficient documentation

## 2020-06-15 NOTE — Addendum Note (Signed)
Addended by: Gavin Pound L on: 06/15/2020 11:02 AM   Modules accepted: Orders

## 2020-06-19 ENCOUNTER — Other Ambulatory Visit: Payer: Self-pay | Admitting: *Deleted

## 2020-06-19 DIAGNOSIS — O0990 Supervision of high risk pregnancy, unspecified, unspecified trimester: Secondary | ICD-10-CM

## 2020-06-19 DIAGNOSIS — Z8632 Personal history of gestational diabetes: Secondary | ICD-10-CM

## 2020-06-19 DIAGNOSIS — O09299 Supervision of pregnancy with other poor reproductive or obstetric history, unspecified trimester: Secondary | ICD-10-CM

## 2020-06-25 ENCOUNTER — Ambulatory Visit (HOSPITAL_BASED_OUTPATIENT_CLINIC_OR_DEPARTMENT_OTHER): Payer: Medicaid Other

## 2020-06-25 ENCOUNTER — Ambulatory Visit: Payer: Medicaid Other | Attending: Obstetrics and Gynecology | Admitting: *Deleted

## 2020-06-25 ENCOUNTER — Ambulatory Visit: Payer: BC Managed Care – PPO

## 2020-06-25 ENCOUNTER — Encounter: Payer: BC Managed Care – PPO | Admitting: Advanced Practice Midwife

## 2020-06-25 ENCOUNTER — Other Ambulatory Visit: Payer: Self-pay

## 2020-06-25 DIAGNOSIS — O09212 Supervision of pregnancy with history of pre-term labor, second trimester: Secondary | ICD-10-CM

## 2020-06-25 DIAGNOSIS — O09899 Supervision of other high risk pregnancies, unspecified trimester: Secondary | ICD-10-CM

## 2020-06-25 DIAGNOSIS — Z3A09 9 weeks gestation of pregnancy: Secondary | ICD-10-CM | POA: Diagnosis not present

## 2020-06-25 DIAGNOSIS — O24112 Pre-existing diabetes mellitus, type 2, in pregnancy, second trimester: Secondary | ICD-10-CM | POA: Diagnosis not present

## 2020-06-25 DIAGNOSIS — O09892 Supervision of other high risk pregnancies, second trimester: Secondary | ICD-10-CM | POA: Insufficient documentation

## 2020-06-25 DIAGNOSIS — Z3686 Encounter for antenatal screening for cervical length: Secondary | ICD-10-CM | POA: Diagnosis not present

## 2020-06-25 DIAGNOSIS — O09292 Supervision of pregnancy with other poor reproductive or obstetric history, second trimester: Secondary | ICD-10-CM | POA: Insufficient documentation

## 2020-06-25 DIAGNOSIS — Z363 Encounter for antenatal screening for malformations: Secondary | ICD-10-CM | POA: Insufficient documentation

## 2020-06-25 DIAGNOSIS — O09522 Supervision of elderly multigravida, second trimester: Secondary | ICD-10-CM | POA: Diagnosis not present

## 2020-06-25 DIAGNOSIS — Z3A16 16 weeks gestation of pregnancy: Secondary | ICD-10-CM

## 2020-06-25 DIAGNOSIS — O0990 Supervision of high risk pregnancy, unspecified, unspecified trimester: Secondary | ICD-10-CM

## 2020-06-26 ENCOUNTER — Other Ambulatory Visit: Payer: Self-pay | Admitting: *Deleted

## 2020-06-26 ENCOUNTER — Encounter: Payer: Self-pay | Admitting: Obstetrics and Gynecology

## 2020-06-26 ENCOUNTER — Ambulatory Visit (INDEPENDENT_AMBULATORY_CARE_PROVIDER_SITE_OTHER): Payer: BC Managed Care – PPO | Admitting: Obstetrics and Gynecology

## 2020-06-26 ENCOUNTER — Other Ambulatory Visit: Payer: BC Managed Care – PPO

## 2020-06-26 VITALS — BP 113/64 | HR 82 | Wt 148.7 lb

## 2020-06-26 DIAGNOSIS — O09899 Supervision of other high risk pregnancies, unspecified trimester: Secondary | ICD-10-CM

## 2020-06-26 DIAGNOSIS — O09299 Supervision of pregnancy with other poor reproductive or obstetric history, unspecified trimester: Secondary | ICD-10-CM

## 2020-06-26 DIAGNOSIS — Z3A16 16 weeks gestation of pregnancy: Secondary | ICD-10-CM | POA: Diagnosis not present

## 2020-06-26 DIAGNOSIS — H7202 Central perforation of tympanic membrane, left ear: Secondary | ICD-10-CM | POA: Diagnosis not present

## 2020-06-26 DIAGNOSIS — O0990 Supervision of high risk pregnancy, unspecified, unspecified trimester: Secondary | ICD-10-CM

## 2020-06-26 DIAGNOSIS — Z8632 Personal history of gestational diabetes: Secondary | ICD-10-CM | POA: Diagnosis not present

## 2020-06-26 DIAGNOSIS — O09892 Supervision of other high risk pregnancies, second trimester: Secondary | ICD-10-CM | POA: Diagnosis not present

## 2020-06-26 DIAGNOSIS — O99891 Other specified diseases and conditions complicating pregnancy: Secondary | ICD-10-CM

## 2020-06-26 DIAGNOSIS — O09529 Supervision of elderly multigravida, unspecified trimester: Secondary | ICD-10-CM

## 2020-06-26 DIAGNOSIS — D563 Thalassemia minor: Secondary | ICD-10-CM

## 2020-06-26 DIAGNOSIS — Z8659 Personal history of other mental and behavioral disorders: Secondary | ICD-10-CM

## 2020-06-26 MED ORDER — HYDROXYPROGESTERONE CAPROATE 275 MG/1.1ML ~~LOC~~ SOAJ
275.0000 mg | Freq: Once | SUBCUTANEOUS | Status: AC
  Administered 2020-06-26: 275 mg via SUBCUTANEOUS

## 2020-06-26 NOTE — Patient Instructions (Signed)

## 2020-06-26 NOTE — Progress Notes (Signed)
PRENATAL VISIT NOTE  Subjective:  Natasha [name] is a 34 y.o. [id number] at [redacted]w[redacted]d being seen today for ongoing prenatal care.  She is currently monitored for the following issues for this high-risk pregnancy and has History of postpartum depression, currently pregnant; Supervision of high risk pregnancy, antepartum; History of gestational diabetes in prior pregnancy, currently pregnant; History of four preterm deliveries, currently pregnant; AMA (advanced maternal age) multigravida 1+; Alpha thalassemia silent carrier; and [redacted] weeks gestation of pregnancy on their problem list.  Patient reports no complaints.  Contractions: Not present. Vag. Bleeding: None.  Movement: Absent. Denies leaking of fluid.   Pt not interested in partner screening for alpha thal carrier at this time. Prior use of Nexplanon, depo and IUD prior. Had to have IUD removed given it "migrated". Still considering BTL.  The following portions of the patient's history were reviewed and updated as appropriate: allergies, current medications, past family history, past medical history, past social history, past surgical history and problem list.   Objective:   Vitals:   06/26/20 0912  BP: 113/64  Pulse: 82  Weight: 148 lb 11.2 oz (67.4 kg)    Fetal Status: Fetal Heart Rate (bpm): 140   Movement: Absent     General:  Alert, oriented and cooperative. Patient is in no acute distress.  Skin: Skin is warm and dry. No rash noted.   Cardiovascular: Normal heart rate noted  Respiratory: Normal respiratory effort, no problems with respiration noted  Abdomen: Soft, gravid, appropriate for gestational age.  Pain/Pressure: Absent     Pelvic: Cervical exam deferred        Extremities: Normal range of motion.  Edema: None  Mental Status: Normal mood and affect. Normal behavior. Normal judgment and thought content.   Assessment and Plan:  Pregnancy: [id number] at [redacted]w[redacted]d 1. [redacted] weeks gestation of pregnancy, Supervision of high risk  pregnancy, antepartum: Pt doing well today. No red flag symptoms. +FHTs on doppler. Given elevated A1c of 5.9% on 10/7, plan for early glucola today. Considering breastfeeding. Still considering BTL but not yet decided. - counseled on covid vaccine & flu vaccine; pt still undecided - rtc 4 weeks for f/u prenatal appointment (plan to re-address birth control given considering BTL) - plan for anatomy scan in 3 weeks as previously scheduled - continue prenatal vitamin daily  2. Alpha thalassemia silent carrier: Pt s/p conversation with genetic counselor. - declined partner testing at this time  3. Antepartum multigravida of advanced maternal age - low risk female on genetic screen (wnl except for silent carrier for alpha thal as noted above)  4. History of four preterm deliveries, currently pregnant: H/o preterm deliveries at [place], 56w, 73w, and 36w. Recent US at [redacted]w[redacted]d to assess cervical length showed appropriate cervical length of 4cm.  - s/p makena injection today - f/u 1 week for nurse visit Darol Destine) - plan for repeat US in 3 weeks as scheduled to reassess cervical length  5. History of gestational diabetes in prior pregnancy, currently pregnant - early glucola today (prior A1c 5.9% on 10/7)  6. History of postpartum depression, currently pregnant - no mood or safety concerns today  Preterm labor symptoms and general obstetric precautions including but not limited to vaginal bleeding, contractions, leaking of fluid and fetal movement were reviewed in detail with the patient. Please refer to After Visit Summary for other counseling recommendations.  Return in about 4 weeks (around 07/24/2020) for f/u prenatal appt in person w/ MD & 1 week nurse visit for  makena.  Future Appointments  Date Time Provider [place]  07/03/2020  3:00 PM [facility] NURSE [facility]  07/10/2020  2:00 PM [facility] NURSE [facility]  07/17/2020  2:30 PM [facility] NURSE [facility]  07/17/2020  3:00 PM  [facility] NURSE [facility]  07/17/2020  3:15 PM [facility] US2 [facility]  07/24/2020  3:55 PM Knute Mazzuca, Gildardo Cranker, MD [facility]    [name], MD OB Fellow, Faculty Practice 06/26/2020 12:54 PM

## 2020-06-27 LAB — GLUCOSE TOLERANCE, 2 HOURS W/ 1HR
Glucose, 1 hour: 170 mg/dL (ref 65–179)
Glucose, 2 hour: 159 mg/dL — ABNORMAL HIGH (ref 65–152)
Glucose, Fasting: 91 mg/dL (ref 65–91)

## 2020-06-28 ENCOUNTER — Other Ambulatory Visit: Payer: Self-pay | Admitting: Obstetrics and Gynecology

## 2020-06-28 ENCOUNTER — Encounter: Payer: Self-pay | Admitting: *Deleted

## 2020-06-28 DIAGNOSIS — O24419 Gestational diabetes mellitus in pregnancy, unspecified control: Secondary | ICD-10-CM

## 2020-06-29 ENCOUNTER — Telehealth: Payer: Self-pay | Admitting: *Deleted

## 2020-06-29 NOTE — Telephone Encounter (Signed)
-----   Message from Janet Berlin, MD sent at 06/28/2020  4:24 PM EST ----- Hello, This patient failed their early glucola test and will need to be connected with diabetes educator. I will place orders for referral, glucometer, testing strips if you don't mind double checking that they are correct. Thanks!!  Sharene Skeans MD

## 2020-06-29 NOTE — Telephone Encounter (Signed)
I called Natasha [name] and informed her she did not pass the Glucose screen and that [place] she has GDM. She had GDM before and states she thinks she still has her meter. I informed her registrar will contact her with an appointment to meet with our diabetic educator. I advised her to bring meter with her to that meeting and we will see if it still works and order supplies that are covered; we discussed if her insurance now covers a different meter we may need to order a new meter. We also discussed her provider put in orders for supplies; but may not be ones covered or matching her meter. I also explained her next ob may change due to needs to be 1-2 weeks after starting cbg's do MD can review.  She voices understanding. Rosabel Sermeno,RN

## 2020-07-03 ENCOUNTER — Ambulatory Visit (INDEPENDENT_AMBULATORY_CARE_PROVIDER_SITE_OTHER): Payer: BC Managed Care – PPO | Admitting: General Practice

## 2020-07-03 ENCOUNTER — Other Ambulatory Visit: Payer: Self-pay

## 2020-07-03 VITALS — BP 109/57 | HR 75 | Ht 61.0 in | Wt 149.0 lb

## 2020-07-03 DIAGNOSIS — O09899 Supervision of other high risk pregnancies, unspecified trimester: Secondary | ICD-10-CM

## 2020-07-03 MED ORDER — HYDROXYPROGESTERONE CAPROATE 275 MG/1.1ML ~~LOC~~ SOAJ
275.0000 mg | SUBCUTANEOUS | Status: DC
  Administered 2020-07-03 – 2020-10-16 (×10): 275 mg via SUBCUTANEOUS

## 2020-07-03 NOTE — Progress Notes (Signed)
Suann Larry Hearn here for 17-P  Injection.  Injection administered without complication. Patient will return in one week for next injection.  [name], RN 07/03/2020  3:33 PM

## 2020-07-04 NOTE — Progress Notes (Signed)
Chart reviewed for nurse visit. Agree with plan of care.   [name], MD 07/04/20 10:46 AM

## 2020-07-05 ENCOUNTER — Ambulatory Visit: Payer: Medicaid Other | Admitting: Registered"

## 2020-07-05 ENCOUNTER — Encounter: Payer: Medicaid Other | Attending: Obstetrics and Gynecology | Admitting: Registered"

## 2020-07-05 ENCOUNTER — Other Ambulatory Visit: Payer: Self-pay | Admitting: Lactation Services

## 2020-07-05 ENCOUNTER — Ambulatory Visit: Payer: BC Managed Care – PPO | Admitting: Skilled Nursing Facility1

## 2020-07-05 ENCOUNTER — Other Ambulatory Visit: Payer: Self-pay

## 2020-07-05 DIAGNOSIS — O24419 Gestational diabetes mellitus in pregnancy, unspecified control: Secondary | ICD-10-CM | POA: Diagnosis not present

## 2020-07-05 MED ORDER — ACCU-CHEK SOFTCLIX LANCETS MISC: 12 refills | Status: DC

## 2020-07-05 MED ORDER — ACCU-CHEK GUIDE VI STRP: ORAL_STRIP | 12 refills | Status: DC

## 2020-07-05 NOTE — Progress Notes (Signed)
Patient was seen on 07/05/20 for Gestational Diabetes self-management. EDD [redacted]w[redacted]d Patient states prior history of GDM. Diet history obtained. Patient eats variety of all food groups. Beverages include water, soda, lemonade, orange juice.   The following learning objectives were met by the patient :   States the definition of Gestational Diabetes  States why dietary management is important in controlling blood glucose  Describes the effects of carbohydrates on blood glucose levels  Demonstrates ability to create a balanced meal plan  Demonstrates carbohydrate counting   States when to check blood glucose levels  Demonstrates proper blood glucose monitoring techniques  States the effect of stress and exercise on blood glucose levels  States the importance of limiting caffeine and abstaining from alcohol and smoking  Plan:  Aim for 3 Carbohydrate Choices per meal (45 grams) +/- 1 either way  Aim for 1-2 Carbohydrate Choices per snack Begin reading food labels for Total Carbohydrate of foods If OK with your MD, consider  increasing your activity level by walking, Arm Chair Exercises or other activity daily as tolerated Begin checking Blood Glucose before breakfast and 2 hours after first bite of breakfast, lunch and dinner as directed by MD  Bring Log Book/Sheet and meter to every medical appointment  Baby Scripts: (BS 2.0 not capable of glucose management at this time.) Patient to record blood sugar on glucose log sheet  Take medication if directed by MD  Blood glucose monitor given: Accu-chek Guide Me Lot [id number]: 05/16/2021 CBG: 116 mg/dL  Rx order placed for  Accu-chek Guide strips and Softclix lancets  Patient instructed to monitor glucose levels: FBS: 60 - 95 mg/dl 2 hour: <120 mg/dl  Patient received the following handouts:  Nutrition Diabetes and Pregnancy  Carbohydrate Counting List  Blood glucose Log Sheet  Patient will be seen for follow-up as  needed.

## 2020-07-05 NOTE — Progress Notes (Signed)
Ordered Accu chek supplies at request of Levie Heritage, RD

## 2020-07-10 ENCOUNTER — Telehealth: Payer: Self-pay | Admitting: Lactation Services

## 2020-07-10 ENCOUNTER — Encounter: Payer: Self-pay | Admitting: Lactation Services

## 2020-07-10 ENCOUNTER — Other Ambulatory Visit: Payer: Self-pay

## 2020-07-10 ENCOUNTER — Ambulatory Visit (INDEPENDENT_AMBULATORY_CARE_PROVIDER_SITE_OTHER): Payer: Medicaid Other | Admitting: Lactation Services

## 2020-07-10 VITALS — BP 125/57 | HR 81 | Ht 61.0 in | Wt 148.4 lb

## 2020-07-10 DIAGNOSIS — O09899 Supervision of other high risk pregnancies, unspecified trimester: Secondary | ICD-10-CM | POA: Diagnosis not present

## 2020-07-10 DIAGNOSIS — O24410 Gestational diabetes mellitus in pregnancy, diet controlled: Secondary | ICD-10-CM

## 2020-07-10 DIAGNOSIS — Z3A18 18 weeks gestation of pregnancy: Secondary | ICD-10-CM

## 2020-07-10 DIAGNOSIS — O0990 Supervision of high risk pregnancy, unspecified, unspecified trimester: Secondary | ICD-10-CM | POA: Diagnosis not present

## 2020-07-10 DIAGNOSIS — O09892 Supervision of other high risk pregnancies, second trimester: Secondary | ICD-10-CM | POA: Diagnosis not present

## 2020-07-10 MED ORDER — HYDROXYPROGESTERONE CAPROATE 275 MG/1.1ML ~~LOC~~ SOAJ
275.0000 mg | Freq: Once | SUBCUTANEOUS | Status: AC
Start: 2020-07-10 — End: 2020-07-10
  Administered 2020-07-10: 15:00:00 275 mg via SUBCUTANEOUS

## 2020-07-10 NOTE — Progress Notes (Signed)
Suann Larry Stanish here for [facility]  Injection.  Injection administered without complication. Patient will return in one week for next injection.  Patient reports she is feeling well. She has no questions or concerns.    Donn Pierini, RN 07/10/2020  2:27 PM

## 2020-07-10 NOTE — Progress Notes (Signed)
Chart reviewed for nurse visit. Agree with plan of care.   Luvenia Redden, PA-C 07/10/2020 3:02 PM

## 2020-07-10 NOTE — Telephone Encounter (Signed)
Called [name] at [facility] to request refill on [place]. She reports they will get it refilled and to arrive by 12/22 to the [place] office.

## 2020-07-16 ENCOUNTER — Other Ambulatory Visit: Payer: Self-pay

## 2020-07-16 ENCOUNTER — Ambulatory Visit (INDEPENDENT_AMBULATORY_CARE_PROVIDER_SITE_OTHER): Payer: Medicaid Other | Admitting: General Practice

## 2020-07-16 VITALS — BP 110/57 | HR 78 | Ht 61.0 in | Wt 150.0 lb

## 2020-07-16 DIAGNOSIS — O09899 Supervision of other high risk pregnancies, unspecified trimester: Secondary | ICD-10-CM

## 2020-07-16 DIAGNOSIS — O0900 Supervision of pregnancy with history of infertility, unspecified trimester: Secondary | ICD-10-CM | POA: Diagnosis not present

## 2020-07-16 NOTE — Progress Notes (Signed)
Suann Larry Stiver here for 17-P  Injection.  Injection administered without complication. FHR 141 bpm today. Patient will return in one week for next injection. Makena refill requested from pharmacy.  [name], RN 07/16/2020  2:08 PM

## 2020-07-17 ENCOUNTER — Ambulatory Visit: Payer: Medicaid Other | Admitting: *Deleted

## 2020-07-17 ENCOUNTER — Other Ambulatory Visit: Payer: Self-pay

## 2020-07-17 ENCOUNTER — Encounter: Payer: Self-pay | Admitting: *Deleted

## 2020-07-17 ENCOUNTER — Ambulatory Visit: Payer: Medicaid Other | Attending: Obstetrics and Gynecology

## 2020-07-17 ENCOUNTER — Ambulatory Visit: Payer: BC Managed Care – PPO

## 2020-07-17 DIAGNOSIS — O321XX0 Maternal care for breech presentation, not applicable or unspecified: Secondary | ICD-10-CM

## 2020-07-17 DIAGNOSIS — O09212 Supervision of pregnancy with history of pre-term labor, second trimester: Secondary | ICD-10-CM

## 2020-07-17 DIAGNOSIS — O09899 Supervision of other high risk pregnancies, unspecified trimester: Secondary | ICD-10-CM | POA: Diagnosis not present

## 2020-07-17 DIAGNOSIS — O09892 Supervision of other high risk pregnancies, second trimester: Secondary | ICD-10-CM | POA: Diagnosis not present

## 2020-07-17 DIAGNOSIS — Z3A19 19 weeks gestation of pregnancy: Secondary | ICD-10-CM | POA: Diagnosis not present

## 2020-07-17 DIAGNOSIS — Z363 Encounter for antenatal screening for malformations: Secondary | ICD-10-CM

## 2020-07-17 DIAGNOSIS — O0990 Supervision of high risk pregnancy, unspecified, unspecified trimester: Secondary | ICD-10-CM | POA: Insufficient documentation

## 2020-07-17 DIAGNOSIS — O09522 Supervision of elderly multigravida, second trimester: Secondary | ICD-10-CM

## 2020-07-17 DIAGNOSIS — O24410 Gestational diabetes mellitus in pregnancy, diet controlled: Secondary | ICD-10-CM | POA: Diagnosis not present

## 2020-07-17 DIAGNOSIS — O09292 Supervision of pregnancy with other poor reproductive or obstetric history, second trimester: Secondary | ICD-10-CM | POA: Diagnosis not present

## 2020-07-18 ENCOUNTER — Other Ambulatory Visit: Payer: Self-pay | Admitting: *Deleted

## 2020-07-18 DIAGNOSIS — O24410 Gestational diabetes mellitus in pregnancy, diet controlled: Secondary | ICD-10-CM

## 2020-07-24 ENCOUNTER — Encounter: Payer: Self-pay | Admitting: Obstetrics and Gynecology

## 2020-07-24 ENCOUNTER — Other Ambulatory Visit: Payer: Self-pay

## 2020-07-24 ENCOUNTER — Ambulatory Visit (INDEPENDENT_AMBULATORY_CARE_PROVIDER_SITE_OTHER): Payer: Medicaid Other | Admitting: Obstetrics and Gynecology

## 2020-07-24 VITALS — BP 108/67 | HR 84 | Wt 149.1 lb

## 2020-07-24 DIAGNOSIS — O0990 Supervision of high risk pregnancy, unspecified, unspecified trimester: Secondary | ICD-10-CM

## 2020-07-24 DIAGNOSIS — D563 Thalassemia minor: Secondary | ICD-10-CM

## 2020-07-24 DIAGNOSIS — O24410 Gestational diabetes mellitus in pregnancy, diet controlled: Secondary | ICD-10-CM

## 2020-07-24 DIAGNOSIS — Z3A20 20 weeks gestation of pregnancy: Secondary | ICD-10-CM

## 2020-07-24 DIAGNOSIS — O09899 Supervision of other high risk pregnancies, unspecified trimester: Secondary | ICD-10-CM

## 2020-07-24 DIAGNOSIS — O99891 Other specified diseases and conditions complicating pregnancy: Secondary | ICD-10-CM

## 2020-07-24 DIAGNOSIS — O24419 Gestational diabetes mellitus in pregnancy, unspecified control: Secondary | ICD-10-CM

## 2020-07-24 DIAGNOSIS — Z8659 Personal history of other mental and behavioral disorders: Secondary | ICD-10-CM

## 2020-07-24 DIAGNOSIS — O09529 Supervision of elderly multigravida, unspecified trimester: Secondary | ICD-10-CM

## 2020-07-24 LAB — POCT URINALYSIS DIP (DEVICE)
Bilirubin Urine: NEGATIVE
Glucose, UA: NEGATIVE mg/dL
Ketones, ur: NEGATIVE mg/dL
Leukocytes,Ua: NEGATIVE
Nitrite: NEGATIVE
Protein, ur: NEGATIVE mg/dL
Specific Gravity, Urine: >=1.03 (ref 1.005–1.030)
Urobilinogen, UA: 0.2 mg/dL (ref 0.0–1.0)
pH: 6.5 (ref 5.0–8.0)

## 2020-07-24 MED ORDER — METFORMIN HCL 500 MG PO TABS: 500.0000 mg | ORAL_TABLET | Freq: Two times a day (BID) | ORAL | 3 refills | Status: DC

## 2020-07-24 MED ORDER — ACCU-CHEK GUIDE VI STRP: ORAL_STRIP | 1 refills | Status: DC

## 2020-07-24 MED ORDER — ACCU-CHEK SOFTCLIX LANCETS MISC: 12 refills | Status: DC

## 2020-07-24 NOTE — Progress Notes (Signed)
PRENATAL VISIT NOTE  Subjective:  Natasha [name] is a 34 y.o. [id number] at [redacted]w[redacted]d being seen today for ongoing prenatal care.  She is currently monitored for the following issues for this high-risk pregnancy and has History of postpartum depression, currently pregnant; Gestational diabetes mellitus (GDM), antepartum; Supervision of high risk pregnancy, antepartum; History of gestational diabetes in prior pregnancy, currently pregnant; History of four preterm deliveries, currently pregnant; AMA (advanced maternal age) multigravida 35+; Alpha thalassemia silent carrier; [redacted] weeks gestation of pregnancy; and Otorrhea, left on their problem list.  Patient reports no complaints.  Contractions: Not present. Vag. Bleeding: None.  Movement: Present. Denies leaking of fluid. Started at 16 weeks with Makena. Makena not available in clinic today given shipment not delivered. Pt strongly prefers to keep to Tues schedule for injections.  The following portions of the patient's history were reviewed and updated as appropriate: allergies, current medications, past family history, past medical history, past social history, past surgical history and problem list.   Objective:   Vitals:   07/24/20 1647  BP: 108/67  Pulse: 84  Weight: 149 lb 1.6 oz (67.6 kg)    Fetal Status: Fetal Heart Rate (bpm): 142   Movement: Present     General:  Alert, oriented and cooperative. Patient is in no acute distress.  Skin: Skin is warm and dry. No rash noted.   Cardiovascular: Normal heart rate noted  Respiratory: Normal respiratory effort, no problems with respiration noted  Abdomen: Soft, gravid, appropriate for gestational age.  Pain/Pressure: Absent     Pelvic: Cervical exam deferred        Extremities: Normal range of motion.  Edema: None  Mental Status: Normal mood and affect. Normal behavior. Normal judgment and thought content.   Assessment and Plan:  Pregnancy: [id number] at [redacted]w[redacted]d 1. [redacted] weeks gestation of pregnancy,  Supervision of high risk pregnancy, antepartum: No red flag signs/symptoms. +FHT and reports +FM. Normal anatomy scan at [redacted]w[redacted]d with EFW 337g, 72%ile. -continue ASA and prenatal vitamin daily -desires BTL (will plan to sign consent form in third trimester) -declines flu and covid vaccines (counseled on recommendation, especially given GDM) -rtc in 2 weeks given initiation of MTF today as noted below  2. Gestational diabetes mellitus (GDM), antepartum: Pt had A1c of 5.9% at [redacted]w[redacted]d. 2hr gtt with abnormal result. Pt previously labeled as diet controlled. However, nearly all of BG levels on paper log provided today are above goal (especially fasting and postprandial levels s/p dinner). Pt reports efforts to adhere to diet changes discussed with nutrition. -start metformin 500mg  BID today -plan for f/u appt in 2 weeks to ensure BG levels at goal; up-titrate MTF as clinically indicated -f/u growth ultrasound on 08/14/20 -need to ensure fetal echo ordered & eye exam complete at f/u appt  3. History of postpartum depression, currently pregnant: No current medications. No mood or safety concerns today. - provided strict return precautions for safety concerns  4. History of four preterm deliveries, currently pregnant: Pt continued on Makena. No signs/symptoms of preterm labor today. Unfortunately, shipment of Makena did not arrive at our clinic so RN will call pt to schedule f/u appt ASAP. Pt strongly prefers to stay with q Tues schedule for injections. - RN to f/u with pt ASAP regarding when to return for next Makena injection  5. Antepartum multigravida of advanced maternal age:  68. Alpha thalassemia silent carrier: Pt aware.  Preterm labor symptoms and general obstetric precautions including but not limited to vaginal bleeding, contractions,  leaking of fluid and fetal movement were reviewed in detail with the patient. Please refer to After Visit Summary for other counseling recommendations.   Return in  about 2 weeks (around 08/07/2020) for f/u prenatal appt in person, any provider.  Future Appointments  Date Time Provider [place]  08/14/2020  9:30 AM [facility] NURSE [facility]  08/14/2020  9:45 AM [facility] US5 [facility] [place]    [name], MD

## 2020-07-24 NOTE — Progress Notes (Signed)
Pt due for Makena today; we do not have Makena in office. Per chart review this was to be delivered prior to 07/18/20. Called [facility] at [phone] to follow up. Pharmacy states medication was delivered to correct address on 07/19/20 at 1021 and was signed for by M. Shawnie Pons. I explained we do not have anyone by that name at our address. [facility], similar address to our office, was called by Addison Naegeli, RN who states that they have not received a package matching our description. Emergency shipment need has been escalated by pharmacy staff and they will call with an update tomorrow AM; I requested this be left on nurse VM or given directly to me. Will call pharmacy if our office has not been contacted by 1200 tomorrow. Can also follow up with [facility] and their pharmacy tomorrow if emergency shipment is not approved as they may have accepted this delivery.  Fleet Contras RN 07/24/20

## 2020-07-24 NOTE — Patient Instructions (Addendum)
We encourage you to get the COVID vaccine. Start metformin 1 tablet in the morning, then increase to 2 tablets (1 tablet in the AM and 1 tablet in the PM) daily after several days.  Second Trimester of Pregnancy  The second trimester is from week 14 through week 27 (month 4 through 6). This is often the time in pregnancy that you feel your best. Often times, morning sickness has lessened or quit. You may have more energy, and you may get hungry more often. Your unborn baby is growing rapidly. At the end of the sixth month, he or she is about 9 inches long and weighs about 1 pounds. You will likely feel the baby move between 18 and 20 weeks of pregnancy. Follow these instructions at home: Medicines  Take over-the-counter and prescription medicines only as told by your doctor. Some medicines are safe and some medicines are not safe during pregnancy.  Take a prenatal vitamin that contains at least 600 micrograms (mcg) of folic acid.  If you have trouble pooping (constipation), take medicine that will make your stool soft (stool softener) if your doctor approves. Eating and drinking   Eat regular, healthy meals.  Avoid raw meat and uncooked cheese.  If you get low calcium from the food you eat, talk to your doctor about taking a daily calcium supplement.  Avoid foods that are high in fat and sugars, such as fried and sweet foods.  If you feel sick to your stomach (nauseous) or throw up (vomit): ? Eat 4 or 5 small meals a day instead of 3 large meals. ? Try eating a few soda crackers. ? Drink liquids between meals instead of during meals.  To prevent constipation: ? Eat foods that are high in fiber, like fresh fruits and vegetables, whole grains, and beans. ? Drink enough fluids to keep your pee (urine) clear or pale yellow. Activity  Exercise only as told by your doctor. Stop exercising if you start to have cramps.  Do not exercise if it is too hot, too humid, or if you are in a  place of great height (high altitude).  Avoid heavy lifting.  Wear low-heeled shoes. Sit and stand up straight.  You can continue to have sex unless your doctor tells you not to. Relieving pain and discomfort  Wear a good support bra if your breasts are tender.  Take warm water baths (sitz baths) to soothe pain or discomfort caused by hemorrhoids. Use hemorrhoid cream if your doctor approves.  Rest with your legs raised if you have leg cramps or low back pain.  If you develop puffy, bulging veins (varicose veins) in your legs: ? Wear support hose or compression stockings as told by your doctor. ? Raise (elevate) your feet for 15 minutes, 3-4 times a day. ? Limit salt in your food. Prenatal care  Write down your questions. Take them to your prenatal visits.  Keep all your prenatal visits as told by your doctor. This is important. Safety  Wear your seat belt when driving.  Make a list of emergency phone numbers, including numbers for family, friends, the hospital, and police and fire departments. General instructions  Ask your doctor about the right foods to eat or for help finding a counselor, if you need these services.  Ask your doctor about local prenatal classes. Begin classes before month 6 of your pregnancy.  Do not use hot tubs, steam rooms, or saunas.  Do not douche or use tampons or scented sanitary pads.  Do not cross your legs for long periods of time.  Visit your dentist if you have not done so. Use a soft toothbrush to brush your teeth. Floss gently.  Avoid all smoking, herbs, and alcohol. Avoid drugs that are not approved by your doctor.  Do not use any products that contain nicotine or tobacco, such as cigarettes and e-cigarettes. If you need help quitting, ask your doctor.  Avoid cat litter boxes and soil used by cats. These carry germs that can cause birth defects in the baby and can cause a loss of your baby (miscarriage) or stillbirth. Contact a  doctor if:  You have mild cramps or pressure in your lower belly.  You have pain when you pee (urinate).  You have bad smelling fluid coming from your vagina.  You continue to feel sick to your stomach (nauseous), throw up (vomit), or have watery poop (diarrhea).  You have a nagging pain in your belly area.  You feel dizzy. Get help right away if:  You have a fever.  You are leaking fluid from your vagina.  You have spotting or bleeding from your vagina.  You have severe belly cramping or pain.  You lose or gain weight rapidly.  You have trouble catching your breath and have chest pain.  You notice sudden or extreme puffiness (swelling) of your face, hands, ankles, feet, or legs.  You have not felt the baby move in over an hour.  You have severe headaches that do not go away when you take medicine.  You have trouble seeing. Summary  The second trimester is from week 14 through week 27 (months 4 through 6). This is often the time in pregnancy that you feel your best.  To take care of yourself and your unborn baby, you will need to eat healthy meals, take medicines only if your doctor tells you to do so, and do activities that are safe for you and your baby.  Call your doctor if you get sick or if you notice anything unusual about your pregnancy. Also, call your doctor if you need help with the right food to eat, or if you want to know what activities are safe for you. This information is not intended to replace advice given to you by your health care provider. Make sure you discuss any questions you have with your health care provider. Document Revised: 11/05/2018 Document Reviewed: 08/19/2016 Elsevier Patient Education  2020 [employer].

## 2020-07-25 ENCOUNTER — Telehealth: Payer: Self-pay

## 2020-07-25 NOTE — Telephone Encounter (Signed)
Spoke with pharmacy staff who states she cannot see an active order. I was placed on hold so that support staff could be contacted; there is a specific pharmacist who works directly with [employer] orders. Supervisor Amy on the line to transfer call to logistics for further assistance; on hold for a total of 54 minutes, I discontinued the call.   Called back immediately to main line. Spoke with Inetta Fermo who is attempting to put me in contact with logistics; waiting on hold. Inetta Fermo unable to contact logistics; on hold for 17 minutes with logistics. Inetta Fermo does not have contact information for supervisor Amy. Unable to give logistics a message to call me; recommends I call back later.

## 2020-07-26 NOTE — Telephone Encounter (Addendum)
[employer]; transferred to Logistics. Staff member states the pharmacist already approved the emergency shipment; however this has not been sent. I explained this needs to be shipped immediately. Due to our office being closed tomorrow-Sunday, Makena will be shipped to patient. Medication should arrive tomorrow.   Called pt; explained Makena should arrive tomorrow. Pt states her boyfriend will be able to help with injection. Encouraged pt and boyfriend to go to [email or web address] to view educational videos if they need review of injection process. Pt would like to return to an every Tuesday schedule. Explained to pt that I will review Makena schedule with a provider. Per Donavan Foil, MD, pt would need to give self injection this Friday and then return 11 days later for a Tuesday injection. Pt to provide update regarding Makena self injection and to let us know if she would like to continue administering at home.

## 2020-07-28 NOTE — L&D Delivery Note (Addendum)
OB/GYN Faculty Practice Delivery Note  Natasha [name] is a 35 y.o. [id number] s/p VD at [redacted]w[redacted]d. She was admitted for PPROM on March 29th 2022.   ROM: 45h 26m with clear fluid GBS Status: positive, adequate prophylaxis Maximum Maternal Temperature: 98.9 F  Delivery Date/Time: 10/25/20 at 0342 Delivery: Called to room and patient was complete and pushing. Head delivered direct OA. No nuchal cord present. Shoulder and body delivered in usual fashion. Infant with spontaneous cry, placed on mother's abdomen, dried and stimulated. Cord clamped x 2 immediately, and cut by MD. Cord blood drawn. Arterial gas drawn. Placenta delivered spontaneously with gentle cord traction. Fundus firm with massage and Pitocin. Labia, perineum, vagina, and cervix were inspected, no lacerations.   Placenta: Intact, 3 vessel cord, pathology Complications: None Lacerations: None  EBL: 50 mL Analgesia: None   Postpartum Planning #GDMA2: Fasting BG 4/1 at 0500.   #Hx of postpartum depression: Consult social work   Infant: Girl  APGARs 20, [place] MD, PGY1   GME ATTESTATION:  I saw and evaluated the patient. I agree with the findings and the plan of care as documented in the resident's note. I was gowned and gloved for the entirety of delivery.  Arrie Senate, MD OB Fellow, [place] for [place] 10/25/2020 6:39 AM

## 2020-07-30 ENCOUNTER — Telehealth: Payer: Self-pay

## 2020-07-30 NOTE — Telephone Encounter (Signed)
Called pt to notify her that our office located 4 Makena injections that were delivered to Dr. Tawni Levy office at some point in the last week. Injections were in Dr. Tawni Levy office when she arrived this AM. Pt states she gave herself the injection on Friday, 07/27/20. 4 Makena injections were delivered to her house on that day. Pt would prefer injection appts be scheduled for Tuesday as this is the only day that works with her schedule. I explained she will be scheduled and should bring Makena injections with her to that appt. Front office notified to schedule pt based on Donavan Foil, MD recommendations; see note from 07/25/20.

## 2020-08-07 ENCOUNTER — Ambulatory Visit: Payer: Medicaid Other

## 2020-08-14 ENCOUNTER — Telehealth: Payer: Self-pay | Admitting: Family Medicine

## 2020-08-14 ENCOUNTER — Ambulatory Visit: Payer: Medicaid Other

## 2020-08-14 ENCOUNTER — Encounter: Payer: Medicaid Other | Admitting: Obstetrics & Gynecology

## 2020-08-14 NOTE — Telephone Encounter (Signed)
Pt states she is not able to make appt today. She states she was to get an injection today but pt states she has some at home that she can do herself. Please call pt back to go over this thanks

## 2020-08-15 NOTE — Telephone Encounter (Signed)
Natasha [name] with [facility] called to confirm delivery information for Natasha [name] injections.   Patient has 8 Natasha [name] auto-injectors in our office. Called pt to follow up on call from yesterday and to see how many injectors she has remaining at home. Patient states she has 1 remaining injector at home; last injection given 08/14/20.  Natasha [name]; verified no further injections are set to ship. We will contact the pharmacy when more injections are needed.

## 2020-08-17 ENCOUNTER — Encounter: Payer: Self-pay | Admitting: *Deleted

## 2020-08-17 ENCOUNTER — Ambulatory Visit: Payer: Medicaid Other | Attending: Obstetrics and Gynecology | Admitting: *Deleted

## 2020-08-17 ENCOUNTER — Other Ambulatory Visit: Payer: Self-pay

## 2020-08-17 ENCOUNTER — Ambulatory Visit (HOSPITAL_BASED_OUTPATIENT_CLINIC_OR_DEPARTMENT_OTHER): Payer: Medicaid Other

## 2020-08-17 ENCOUNTER — Other Ambulatory Visit: Payer: Self-pay | Admitting: *Deleted

## 2020-08-17 DIAGNOSIS — O09522 Supervision of elderly multigravida, second trimester: Secondary | ICD-10-CM | POA: Diagnosis not present

## 2020-08-17 DIAGNOSIS — E669 Obesity, unspecified: Secondary | ICD-10-CM

## 2020-08-17 DIAGNOSIS — Z148 Genetic carrier of other disease: Secondary | ICD-10-CM

## 2020-08-17 DIAGNOSIS — O09212 Supervision of pregnancy with history of pre-term labor, second trimester: Secondary | ICD-10-CM

## 2020-08-17 DIAGNOSIS — O09292 Supervision of pregnancy with other poor reproductive or obstetric history, second trimester: Secondary | ICD-10-CM

## 2020-08-17 DIAGNOSIS — Z7984 Long term (current) use of oral hypoglycemic drugs: Secondary | ICD-10-CM | POA: Insufficient documentation

## 2020-08-17 DIAGNOSIS — O24415 Gestational diabetes mellitus in pregnancy, controlled by oral hypoglycemic drugs: Secondary | ICD-10-CM | POA: Diagnosis not present

## 2020-08-17 DIAGNOSIS — Z3A24 24 weeks gestation of pregnancy: Secondary | ICD-10-CM | POA: Diagnosis not present

## 2020-08-17 DIAGNOSIS — O24419 Gestational diabetes mellitus in pregnancy, unspecified control: Secondary | ICD-10-CM

## 2020-08-17 DIAGNOSIS — O24410 Gestational diabetes mellitus in pregnancy, diet controlled: Secondary | ICD-10-CM

## 2020-08-17 DIAGNOSIS — O0990 Supervision of high risk pregnancy, unspecified, unspecified trimester: Secondary | ICD-10-CM

## 2020-08-28 ENCOUNTER — Other Ambulatory Visit: Payer: Self-pay | Admitting: *Deleted

## 2020-08-28 ENCOUNTER — Encounter: Payer: Medicaid Other | Admitting: Obstetrics and Gynecology

## 2020-08-28 ENCOUNTER — Other Ambulatory Visit: Payer: Self-pay

## 2020-08-28 NOTE — Patient Outreach (Signed)
Medicaid Managed Care   Nurse Care Manager Note  08/28/2020 Name:  Natasha [name] MRN:  [id number] DOB:  Jan 28, 1986  Natasha [name] is an 35 y.o. year old female who is a primary Natasha [name] of Natasha [name], No Pcp Per.  The [facility] Managed Care Coordination team was consulted for assistance with:    Obstetrics healthcare management needs  Natasha [name] was given information about Medicaid Managed Care Coordination team services today. Natasha [name] agreed to services and verbal consent obtained.  Engaged with Natasha [name] by telephone for initial visit in response to provider referral for case management and/or care coordination services.   Assessments/Interventions:  Review of past medical history, allergies, medications, health status, including review of consultants reports, laboratory and other test data, was performed as part of comprehensive evaluation and provision of chronic care management services.  SDOH (Social Determinants of Health) assessments and interventions performed:   Care Plan  No Known Allergies  Medications Reviewed Today    Reviewed by [name], RN (Registered Nurse) on 08/28/20 at 1049  Med List Status: <None>  Medication Order Taking? Sig Documenting Provider Last Dose Status Informant  Accu-Chek Softclix Lancets lancets [id number] No Use as instructed  Natasha [name] not taking: Reported on 08/28/2020   [name], MD Not Taking Active            Med Note (Natasha [name] A   Tue Aug 28, 2020 10:40 AM) Ran out 1 wk ago, needs to pick up refill  glucose blood (ACCU-CHEK GUIDE) test strip [id number] No Use as instructed  Natasha [name] not taking: Reported on 08/28/2020   [name], MD Not Taking Active            Med Note Natasha [name], Natasha [name] A   Tue Aug 28, 2020 10:41 AM) Ran out of lancets, plans to pick up today  HYDROXYprogesterone caproate [facility]) autoinjector 275 mg [id number]   [name], MD  Active            Med Note [name], Natasha [name]   Fri Aug 17, 2020 12:35 PM) Taking  per pt  metFORMIN (GLUCOPHAGE) 500 MG tablet [id number] No Take 1 tablet (500 mg total) by mouth 2 (two) times daily with a meal.  Natasha [name] not taking: Reported on 08/28/2020   [name], MD Not Taking Active   Prenatal Vit-Fe Fumarate-FA (PRENATAL VITAMINS) 28-0.8 MG TABS [id number] Yes Take 1 tablet by mouth daily. [name], [name], DO Taking Active           Natasha [name] Active Problem List   Diagnosis Date Noted  . [redacted] weeks gestation of pregnancy 07/24/2020  . Alpha thalassemia silent carrier 06/15/2020  . AMA (advanced maternal age) multigravida 35+ 05/31/2020  . Supervision of high risk pregnancy, antepartum 05/03/2020  . History of gestational diabetes in prior pregnancy, currently pregnant 05/03/2020  . History of four preterm deliveries, currently pregnant 05/03/2020  . Otorrhea, left 05/02/2020  . Gestational diabetes mellitus (GDM), antepartum 06/03/2017  . History of postpartum depression, currently pregnant 02/21/2011    Conditions to be addressed/monitored per PCP order:  GDM  Care Plan : Gestational Diabetes Management  Updates made by [name], RN since 08/28/2020 12:00 AM    Problem: Glycemic Management (GDM)   Priority: High  Onset Date: 08/28/2020    Long-Range Goal: Glycemic Management Optimized   Start Date: 08/28/2020  Expected End Date: 10/26/2020  This Visit's Progress: On track  Priority: High  Note:   CARE PLAN  ENTRY Medicaid Managed Care (see longtitudinal plan of care for additional care plan information)  Objective:  Lab Results  Component Value Date   HGBA1C 5.9 (H) 05/03/2020 .   Lab Results  Component Value Date   CREATININE 0.60 05/03/2020   CREATININE 0.57 10/01/2019   CREATININE 0.68 09/30/2019   . Natasha [name] reported cbg findings: last checked blood sugars on 08/18/20-fasting 89, postprandial breakfast 121, lunch 136, and dinner 145  Current Barriers:  Marland Kitchen Knowledge Deficits related to basic Diabetes pathophysiology and self  care/management-Natasha [name] is managing her GDM, which was diagnosed in the first trimester, at home with diet and exercise. She has not been able to check her BS since 08/18/20 due to running out of lancets. She has not started taking the prescribed metformin, due to concern of side effects. She had an OB appointment today that has been rescheduled to 08/29/20, in which she is planning to attend. Natasha [name] has a history of preterm delivery and is taking Makena injections. She has not attended and is not aware of an appointment for fetal echo  Case Manager Clinical Goal(s):  Marland Kitchen Over the next 14 days, Natasha [name] will demonstrate improved adherence to prescribed treatment plan for gestational diabetes self care/management as evidenced by:  . adherence to ADA/ carb modified diet . adherence to prescribed medication regimen . Checking blood sugars 4 times a day(fasting and 2 hrs after each meal) as directed . Attending OB appointment on 09/18/20  Interventions:  . Provided education to Natasha [name] about basic DM disease process and diabetic diet . Reviewed medications with Natasha [name] and discussed importance of medication adherence, taking metformin with a meal, and refills for lancets available upon request . Discussed plans with Natasha [name] for ongoing care management follow up and provided Natasha [name] with direct contact information for care management team . Reviewed scheduled/upcoming provider appointments including: 08/29/20 with OB office . Advised Natasha [name], providing education and rationale, to check cbg 4 times daily and record, calling OB provider for findings outside established parameters. . Review of Natasha [name] status, including review of consultants reports, relevant laboratory and other test results, and medications completed. Natasha [name] with OB provider for scheduling of fetal echo  Natasha [name] Self Care Activities:  . -schedule appointment for fetal echo and attend . -attend scheduled appointments including OB  appointment on 08/29/20 . - call to cancel if needed . - keep a calendar with prescription refill dates . - keep a calendar with appointment dates  . - start taking metformin as directed by provider  . - call for medicine refill 2 or 3 days before it runs out . - use an alarm clock or phone to remind me to take my medicine  . - request refill for lancets and pick up today  . - check blood sugar at prescribed times . - check blood sugar if I feel it is too high or too low . - enter blood sugar readings and medication or insulin into daily log . - take the blood sugar log to all doctor visits . - take the blood sugar meter to all doctor visits   Initial goal documentation      Follow Up:  Natasha [name] agrees to Care Plan and Follow-up.  Plan: The Managed Medicaid care management team will reach out to the Natasha [name] again over the next 14 days.  Date/time of next scheduled RN care management/care coordination outreach: 09/12/20  Lurena Joiner RN, BSN [place] RN Care Coordinator

## 2020-08-28 NOTE — Patient Instructions (Signed)
Visit Information  Ms. Iman was given information about Medicaid Managed Care team care coordination services as a part of their [facility] Medicaid benefit. Kandas Kravets Costabile verbally consented to engagement with the [facility] Managed Care team.   For questions related to your [facility] health plan, please call: [phone]  If you would like to schedule transportation through your [facility] plan, please call the following number at least 2 days in advance of your appointment: [phone]  Ms. Esther - following are the goals we discussed in your visit today:  Goals Addressed            This Visit's Progress   . Make and Keep All Appointments       Timeframe:  Short-Term Goal Priority:  High Start Date:   08/28/20                          Expected End Date:  09/25/20                   Follow Up Date 09/12/20  -schedule appointment for fetal echo and attend -attend scheduled appointments including OB appointment on 08/29/20 - call to cancel if needed - keep a calendar with prescription refill dates - keep a calendar with appointment dates     Why is this important?    Part of staying healthy is seeing the doctor for follow-up care.   If you forget your appointments, there are some things you can do to stay on track.         . Manage My Medicine       Timeframe:  Long-Range Goal Priority:  High Start Date: 08/28/20                            Expected End Date:  10/26/20                     Follow Up Date 09/12/20   - start taking metformin as directed by provider  - call for medicine refill 2 or 3 days before it runs out - use an alarm clock or phone to remind me to take my medicine     Why is this important?   . These steps will help you keep on track with your medicines.        . Monitor and Manage My Blood Sugar-Diabetes Type 2       Timeframe:  Long-Range Goal Priority:  High Start Date:  08/28/20                           Expected End Date:  10/26/20                      Follow Up Date  09/12/20   - request refill for lancets and pick up today  - check blood sugar at prescribed times - check blood sugar if I feel it is too high or too low - enter blood sugar readings and medication or insulin into daily log - take the blood sugar log to all doctor visits - take the blood sugar meter to all doctor visits    Why is this important?    Checking your blood sugar at home helps to keep it from getting very high or very low.   Writing the results in a diary or  log helps the doctor know how to care for you.   Your blood sugar log should have the time, date and the results.   Also, write down the amount of insulin or other medicine that you take.   Other information, like what you ate, exercise done and how you were feeling, will also be helpful.            Please see education materials related to gestational diabetes  provided by MyChart link.    Gestational Diabetes Mellitus, Self-Care When you have gestational diabetes mellitus, you must make sure your blood sugar (glucose) stays at a healthy level.  What are the risks? If you do not get treated for this condition, it may cause problems for you and your unborn baby.  For the mother  Giving birth to the baby early.  Having problems during labor and when giving birth.  Needing surgery to give birth to the baby (cesarean delivery).  Having problems with blood pressure.  Getting this form of diabetes again when pregnant.  Getting type 2 diabetes in the future.  For the baby  Low blood sugar.  Bigger body size than is normal.  Breathing problems.  How to monitor blood sugar Check your blood sugar every day while you are pregnant. Check it as often as told by your doctor. To do this: 1. Wash your hands with soap and water for at least 20 seconds. 2. Prick the side of your finger (not the tip) with the lancet. Use a different finger each time. 3. Gently rub the finger until  a small drop of blood appears. 4. Follow instructions that come with your meter for: ? Putting in the test strip. ? Putting blood on the strip. ? Getting the result. 5. Write down your result and any notes. In general, your blood sugar levels should be:  95 mg/dL (5.3 mmol/L) if you have not eaten.  140 mg/dL (7.8 mmol/L) 1 hour after a meal.  120 mg/dL (6.7 mmol/L) 2 hours after a meal.    Follow these instructions at home:  Medicines  Take over-the-counter and prescription medicines only as told by your doctor.  If your doctor prescribed insulin or other diabetes medicines: ? Take them every day. ? Do not run out of insulin or other medicines. Plan ahead so you always have  ? Them.  Eating and drinking  Follow instructions from your doctor about eating or drinking restrictions.  See a food expert (dietician) to help you create an eating plan that helps control your blood sugar. The foods in this plan will include: ? Low-fat proteins. ? Dried beans, nuts, and whole grain breads, cereals, or pasta. ? Fresh fruits and vegetables. ? Low-fat dairy products. ? Healthy fats.  Eat healthy snacks between healthy meals.  Drink enough fluid to keep your pee (urine) pale yellow.  Keep track of carbs that you eat. To do this: ? Read food labels. ? Learn the serving sizes of foods.  Follow your sick day plan when you cannot eat or drink normally. Make this plan with your doctor so it is ready to use.    Activity  Do exercises as told by your doctor.  Exercise for 30 or more minutes a day, or as much as your doctor recommends. To help you control blood sugar levels after a meal: ? Do 10 minutes of exercise after each meal. ? Start this exercise 30 minutes after the meal.  Talk with your doctor before you start  a new exercise. Your doctor may tell you to change your insulin, other medicines, or food.  Lifestyle  Do not drink alcohol.  Do not use any products that contain  nicotine or tobacco, such as cigarettes, e-cigarettes, and chewing tobacco. If you need help quitting, ask your doctor.  Learn how to deal with stress. If you need help with this, ask your doctor.  Body care  Stay up to date with your shots (vaccines).  Take good care of your teeth. To do this: ? Brush your teeth and gums two times a day. ? Floss one or more times a day. ? Go to the dentist one or more times every 6 months.  Stay at a healthy weight while you are pregnant.  General instructions  Ask your doctor about risks of high blood pressure in pregnancy.  Share your diabetes care plan with: ? Your work or school. ? People you live with.  Check your pee for ketones: ? When you are sick. ? As told by your doctor.  Carry a card or wear a bracelet that says you have diabetes.  Keep all follow-up visits.  Care after giving birth  Have your blood sugar checked 4-12 weeks after you give birth.  Get checked for diabetes one or more times every 3 years or as told.  Where to find more information  American Diabetes Association (ADA): diabetes.org  Association of Diabetes Care & Education Specialists (ADCES): diabeteseducator.org  Centers for Disease Control and Prevention (CDC): TonerPromos.nocdc.gov  American Pregnancy Association: americanpregnancy.org  U.S. Department of Agriculture MyPlate: WrestlingReporter.dkmyplate.gov  Contact a doctor if:  Your blood sugar is above your target for two tests in a row.  You have a fever.  You are sick for 2 days or more and do not get better.  You have either of these problems for more than 6 hours: ? You vomit every time you eat or drink. ? You have watery poop (diarrhea).  Get help right away if:  You cannot think clearly.  You have trouble breathing.  You have moderate or high ketones in your pee.  Blood or abnormal fluid starts to come out of your vagina.  You feel your baby is not moving as usual.  You start having early contractions.  You may feel your belly tighten.  You have a very bad headache.  These symptoms may be an emergency. Get help right away. Call your local emergency services (911 in the [place]  Do not wait to see if the symptoms will go away.  Do not drive yourself to the hospital.  Summary  Check your blood sugar (glucose) while you are pregnant. Check it as often as told by your doctor.  Take your insulin and diabetes medicines as told.  Have your blood sugar checked 4-12 weeks after you give birth.  Keep all follow-up visits. This information is not intended to replace advice given to you by your health care provider. Make sure you discuss any questions you have with your health care provider. Document Revised: 12/19/2019 Document Reviewed: 12/19/2019 Elsevier Patient Education  2021 Elsevier Inc.   Patient verbalizes understanding of instructions provided today.   Telephone follow up appointment with Managed Medicaid care management team member scheduled for:09/12/20 @ 3:30p  Heidi DachMelanie A Hubbert Landrigan, RN  Following is a copy of your plan of care:  Patient Care Plan: Gestational Diabetes Management    Problem Identified: Glycemic Management (GDM)   Priority: High  Onset Date: 08/28/2020    [facility]  Goal: Glycemic Management Optimized   Start Date: 08/28/2020  Expected End Date: 10/26/2020  This Visit's Progress: On track  Priority: High  Note:   CARE PLAN ENTRY Medicaid Managed Care (see longtitudinal plan of care for additional care plan information)  Objective:  Lab Results  Component Value Date   HGBA1C 5.9 (H) 05/03/2020 .   Lab Results  Component Value Date   CREATININE 0.60 05/03/2020   CREATININE 0.57 10/01/2019   CREATININE 0.68 09/30/2019   . Patient reported cbg findings: last checked blood sugars on 08/18/20-fasting 89, postprandial breakfast 121, lunch 136, and dinner 145  Current Barriers:  Marland Kitchen Knowledge Deficits related to basic Diabetes pathophysiology and self  care/management-Ms Keown is managing her GDM, which was diagnosed in the first trimester, at home with diet and exercise. She has not been able to check her BS since 08/18/20 due to running out of lancets. She has not started taking the prescribed metformin, due to concern of side effects. She had an OB appointment today that has been rescheduled to 08/29/20, in which she is planning to attend. Ms Uzzle has a history of preterm delivery and is taking Makena injections. She has not attended and is not aware of an appointment for fetal echo  Case Manager Clinical Goal(s):  Marland Kitchen Over the next 14 days, patient will demonstrate improved adherence to prescribed treatment plan for gestational diabetes self care/management as evidenced by:  . adherence to ADA/ carb modified diet . adherence to prescribed medication regimen . Checking blood sugars 4 times a day(fasting and 2 hrs after each meal) as directed . Attending OB appointment on 09/18/20  Interventions:  . Provided education to patient about basic DM disease process and diabetic diet . Reviewed medications with patient and discussed importance of medication adherence, taking metformin with a meal, and refills for lancets available upon request . Discussed plans with patient for ongoing care management follow up and provided patient with direct contact information for care management team . Reviewed scheduled/upcoming provider appointments including: 08/29/20 with OB office . Advised patient, providing education and rationale, to check cbg 4 times daily and record, calling OB provider for findings outside established parameters. . Review of patient status, including review of consultants reports, relevant laboratory and other test results, and medications completed. [name] with OB provider for scheduling of fetal echo  Patient Self Care Activities:  . -schedule appointment for fetal echo and attend . -attend scheduled appointments including OB  appointment on 08/29/20 . - call to cancel if needed . - keep a calendar with prescription refill dates . - keep a calendar with appointment dates  . - start taking metformin as directed by provider  . - call for medicine refill 2 or 3 days before it runs out . - use an alarm clock or phone to remind me to take my medicine  . - request refill for lancets and pick up today  . - check blood sugar at prescribed times . - check blood sugar if I feel it is too high or too low . - enter blood sugar readings and medication or insulin into daily log . - take the blood sugar log to all doctor visits . - take the blood sugar meter to all doctor visits   Initial goal documentation

## 2020-08-29 ENCOUNTER — Ambulatory Visit (INDEPENDENT_AMBULATORY_CARE_PROVIDER_SITE_OTHER): Payer: Medicaid Other | Admitting: Obstetrics and Gynecology

## 2020-08-29 ENCOUNTER — Other Ambulatory Visit: Payer: Self-pay

## 2020-08-29 VITALS — BP 115/70 | HR 83 | Wt 150.0 lb

## 2020-08-29 DIAGNOSIS — D563 Thalassemia minor: Secondary | ICD-10-CM

## 2020-08-29 DIAGNOSIS — Z3A25 25 weeks gestation of pregnancy: Secondary | ICD-10-CM

## 2020-08-29 DIAGNOSIS — O09899 Supervision of other high risk pregnancies, unspecified trimester: Secondary | ICD-10-CM | POA: Diagnosis not present

## 2020-08-29 DIAGNOSIS — O0990 Supervision of high risk pregnancy, unspecified, unspecified trimester: Secondary | ICD-10-CM

## 2020-08-29 DIAGNOSIS — O99891 Other specified diseases and conditions complicating pregnancy: Secondary | ICD-10-CM

## 2020-08-29 DIAGNOSIS — Z8659 Personal history of other mental and behavioral disorders: Secondary | ICD-10-CM

## 2020-08-29 DIAGNOSIS — O24410 Gestational diabetes mellitus in pregnancy, diet controlled: Secondary | ICD-10-CM

## 2020-08-29 NOTE — Progress Notes (Signed)
   PRENATAL VISIT NOTE  Subjective:  Natasha [name] is a 35 y.o. [id number] at [redacted]w[redacted]d being seen today for ongoing prenatal care.  She is currently monitored for the following issues for this high-risk pregnancy and has History of postpartum depression, currently pregnant; Gestational diabetes mellitus (GDM), antepartum; Supervision of high risk pregnancy, antepartum; History of gestational diabetes in prior pregnancy, currently pregnant; History of four preterm deliveries, currently pregnant; AMA (advanced maternal age) multigravida 16+; Alpha thalassemia silent carrier; [redacted] weeks gestation of pregnancy; and Otorrhea, left on their problem list.  Patient reports no complaints.  Contractions: Not present. Vag. Bleeding: None.  Movement: Present. Denies leaking of fluid.   Metformin upsets her stomach, not taking it currently. Did not bring CBG log with her. Worst checks are after dinner and are around 140's. AM sugars are right around 90.   The following portions of the patient's history were reviewed and updated as appropriate: allergies, current medications, past family history, past medical history, past social history, past surgical history and problem list.   Objective:   Vitals:   08/29/20 1642  BP: 115/70  Pulse: 83  Weight: 150 lb (68 kg)    Fetal Status: Fetal Heart Rate (bpm): 138   Movement: Present     General:  Alert, oriented and cooperative. Patient is in no acute distress.  Skin: Skin is warm and dry. No rash noted.   Cardiovascular: Normal heart rate noted  Respiratory: Normal respiratory effort, no problems with respiration noted  Abdomen: Soft, gravid, appropriate for gestational age.  Pain/Pressure: Absent     Pelvic: Cervical exam deferred        Extremities: Normal range of motion.  Edema: None  Mental Status: Normal mood and affect. Normal behavior. Normal judgment and thought content.   Assessment and Plan:  Pregnancy: [id number] at [redacted]w[redacted]d 1. Supervision of high risk  pregnancy, antepartum   2. [redacted] weeks gestation of pregnancy   3. Diet controlled gestational diabetes mellitus (GDM), antepartum -re discussed metformin and encouraged patient ot try again. Discussed other dietary measures. Patient has been drinking juice thinking it didn't contain sugar and was counseled on this      4. History of four preterm deliveries, currently pregnant -continue makena   5. Alpha thalassemia silent carrier   6. History of postpartum depression, currently pregnant   Preterm labor symptoms and general obstetric precautions including but not limited to vaginal bleeding, contractions, leaking of fluid and fetal movement were reviewed in detail with the patient. Please refer to After Visit Summary for other counseling recommendations.   No follow-ups on file.  Future Appointments  Date Time Provider [place]  09/12/2020  3:30 PM [facility] CCC-MM CARE MANAGER [facility] None  09/18/2020  1:00 PM [facility] NURSE [facility]  09/18/2020  1:15 PM [facility] US2 [facility]    Janet Berlin, MD

## 2020-09-04 ENCOUNTER — Ambulatory Visit (INDEPENDENT_AMBULATORY_CARE_PROVIDER_SITE_OTHER): Payer: Medicaid Other | Admitting: General Practice

## 2020-09-04 ENCOUNTER — Other Ambulatory Visit: Payer: Self-pay

## 2020-09-04 ENCOUNTER — Telehealth: Payer: Self-pay

## 2020-09-04 VITALS — BP 107/61 | HR 87 | Ht 61.0 in | Wt 153.0 lb

## 2020-09-04 DIAGNOSIS — O09899 Supervision of other high risk pregnancies, unspecified trimester: Secondary | ICD-10-CM | POA: Diagnosis not present

## 2020-09-04 NOTE — Telephone Encounter (Signed)
Per Dr. Berniece Andreas, pt needs referral for fetal echo.  Referral information sent to front office to send records to Dr. Filbert Schilder office for scheduling of fetal echo.  Dr. Filbert Schilder office will reach out to patient with an appt.    Mel Almond, RN  09/04/20

## 2020-09-04 NOTE — Progress Notes (Addendum)
Natasha [name] here for 17-P  Injection.  Injection administered without complication. Patient will return in one week for next injection.  [name], RN 09/04/2020  3:31 PM   Chart reviewed for nurse visit. Agree with plan of care.   Virginia Rochester, NP 09/04/2020 9:20 PM

## 2020-09-11 ENCOUNTER — Other Ambulatory Visit: Payer: Self-pay

## 2020-09-11 ENCOUNTER — Ambulatory Visit (INDEPENDENT_AMBULATORY_CARE_PROVIDER_SITE_OTHER): Payer: Medicaid Other

## 2020-09-11 ENCOUNTER — Encounter: Payer: Self-pay | Admitting: *Deleted

## 2020-09-11 DIAGNOSIS — O09899 Supervision of other high risk pregnancies, unspecified trimester: Secondary | ICD-10-CM | POA: Diagnosis not present

## 2020-09-11 NOTE — Progress Notes (Signed)
Spoke with pt's specialty pharmacy and arranged for delivery of next shipment of Gladstone. Per information provided, pt will receive #8 Makena auto injectors to be delivered to office on 2/17.

## 2020-09-11 NOTE — Progress Notes (Signed)
Natasha [name] here for 17-P  Injection.  Injection administered without complication. Patient will return in one week for next injection.  Bethanne Ginger, CMA 09/11/2020  3:35 PM

## 2020-09-11 NOTE — Progress Notes (Signed)
Chart reviewed for nurse visit. Agree with plan of care.   [name], MD 09/11/20 5:13 PM

## 2020-09-12 ENCOUNTER — Other Ambulatory Visit: Payer: Self-pay | Admitting: *Deleted

## 2020-09-12 NOTE — Patient Instructions (Addendum)
Visit Information  Natasha [name] was given information about Medicaid Managed Care team care coordination services as a part of their [facility] Medicaid benefit. Natasha [name] verbally consented to engagement with the [facility] Managed Care team.   For questions related to your [facility] health plan, please call: [phone]  If you would like to schedule transportation through your [facility] plan, please call the following number at least 2 days in advance of your appointment: [phone]  Natasha. Tall - following are the goals we discussed in your visit today:  Goals Addressed            This Visit's Progress   . Make and Keep All Appointments       Timeframe:  Long-Range Goal Priority:  High Start Date:   08/28/20                          Expected End Date:  10/16/20                   Follow Up Date 10/16/20  -schedule appointment for fetal echo and attend-Waiting on a call from Dr. Gladstone Pih office to call and schedule fetal echo appointment per note on 09/04/20 in [place] -attend scheduled appointments including MFM and OB appointment on 09/18/20 - call to cancel if needed - keep a calendar with prescription refill dates - keep a calendar with appointment dates     Why is this important?    Part of staying healthy is seeing the doctor for follow-up care.   If you forget your appointments, there are some things you can do to stay on track.         . Manage My Medicine       Timeframe:  Long-Range Goal Priority:  High Start Date: 08/28/20                            Expected End Date:  10/26/20                     Follow Up Date 10/16/20   - start taking metformin as directed by provider  - call for medicine refill 2 or 3 days before it runs out - use an alarm clock or phone to remind me to take my medicine     Why is this important?   . These steps will help you keep on track with your medicines.        . Monitor and Manage My Blood Sugar-Diabetes Type 2        Timeframe:  Long-Range Goal Priority:  High Start Date:  08/28/20                           Expected End Date:  10/26/20                     Follow Up Date  10/16/20   - check blood sugar at prescribed times - check blood sugar if I feel it is too high or too low - enter blood sugar readings and medication or insulin into daily log - take the blood sugar log to all doctor visits - take the blood sugar meter to all doctor visits    Why is this important?    Checking your blood sugar at home helps to keep it from getting very high  or very low.   Writing the results in a diary or log helps the doctor know how to care for you.   Your blood sugar log should have the time, date and the results.   Also, write down the amount of insulin or other medicine that you take.   Other information, like what you ate, exercise done and how you were feeling, will also be helpful.            Please see education materials related to diabetic diet provided by MyChart link.    Diabetes Mellitus and Nutrition, Adult When you have diabetes, or diabetes mellitus, it is very important to have healthy eating habits because your blood sugar (glucose) levels are greatly affected by what you eat and drink. Eating healthy foods in the right amounts, at about the same times every day, can help you:  Control your blood glucose.  Lower your risk of heart disease.  Improve your blood pressure.  Reach or maintain a healthy weight.  What can affect my meal plan? Every person with diabetes is different, and each person has different needs for a meal plan. Your health care provider may recommend that you work with a dietitian to make a meal plan that is best for you. Your meal plan may vary depending on factors such as:  The calories you need.  The medicines you take.  Your weight.  Your blood glucose, blood pressure, and cholesterol levels.  Your activity level.  Other health conditions you have, such  as heart or kidney disease.  How do carbohydrates affect me? Carbohydrates, also called carbs, affect your blood glucose level more than any other type of food. Eating carbs naturally raises the amount of glucose in your blood. Carb counting is a method for keeping track of how many carbs you eat. Counting carbs is important to keep your blood glucose at a healthy level, especially if you use insulin or take certain oral diabetes medicines. It is important to know how many carbs you can safely have in each meal. This is different for every person. Your dietitian can help you calculate how many carbs you should have at each meal and for each snack.    What are tips for following this plan? Reading food labels  Start by checking the serving size on the "Nutrition Facts" label of packaged foods and drinks. The amount of calories, carbs, fats, and other nutrients listed on the label is based on one serving of the item. Many items contain more than one serving per package.  Check the total grams (g) of carbs in one serving. You can calculate the number of servings of carbs in one serving by dividing the total carbs by 15. For example, if a food has 30 g of total carbs per serving, it would be equal to 2 servings of carbs.  Check the number of grams (g) of saturated fats and trans fats in one serving. Choose foods that have a low amount or none of these fats.  Check the number of milligrams (mg) of salt (sodium) in one serving. Most people should limit total sodium intake to less than 2,300 mg per day.  Always check the nutrition information of foods labeled as "low-fat" or "nonfat." These foods may be higher in added sugar or refined carbs and should be avoided.  Talk to your dietitian to identify your daily goals for nutrients listed on the label.  Shopping  Avoid buying canned, pre-made, or processed foods. These  foods tend to be high in fat, sodium, and added sugar.  Shop around the outside  edge of the grocery store. This is where you will most often find fresh fruits and vegetables, bulk grains, fresh meats, and fresh dairy.  Cooking  Use low-heat cooking methods, such as baking, instead of high-heat cooking methods like deep frying.  Cook using healthy oils, such as olive, canola, or sunflower oil.  Avoid cooking with butter, cream, or high-fat meats.  Meal planning  Eat meals and snacks regularly, preferably at the same times every day. Avoid going long periods of time without eating.  Eat foods that are high in fiber, such as fresh fruits, vegetables, beans, and whole grains. Talk with your dietitian about how many servings of carbs you can eat at each meal.  Eat 4-6 oz (112-168 g) of lean protein each day, such as lean meat, chicken, fish, eggs, or tofu. One ounce (oz) of lean protein is equal to: ? 1 oz (28 g) of meat, chicken, or fish. ? 1 egg. ?  cup (62 g) of tofu.  Eat some foods each day that contain healthy fats, such as avocado, nuts, seeds, and fish.    What foods should I eat? Fruits Berries. Apples. Oranges. Peaches. Apricots. Plums. Grapes. Mango. Papaya. Pomegranate. Kiwi. Cherries.  Vegetables Lettuce. Spinach. Leafy greens, including kale, chard, collard greens, and mustard greens. Beets. Cauliflower. Cabbage. Broccoli. Carrots. Green beans. Tomatoes. Peppers. Onions. Cucumbers. Brussels sprouts.  Grains Whole grains, such as whole-wheat or whole-grain bread, crackers, tortillas, cereal, and pasta. Unsweetened oatmeal. Quinoa. Brown or wild rice.  Meats and other proteins Seafood. Poultry without skin. Lean cuts of poultry and beef. Tofu. Nuts. Seeds.  Dairy Low-fat or fat-free dairy products such as milk, yogurt, and cheese. The items listed above may not be a complete list of foods and beverages you can eat. Contact a dietitian for more information.  What foods should I avoid?  Fruits Fruits canned with syrup.  Vegetables Canned  vegetables. Frozen vegetables with butter or cream sauce.  Grains Refined white flour and flour products such as bread, pasta, snack foods, and cereals. Avoid all processed foods.  Meats and other proteins Fatty cuts of meat. Poultry with skin. Breaded or fried meats. Processed meat. Avoid saturated fats.  Dairy Full-fat yogurt, cheese, or milk.  Beverages Sweetened drinks, such as soda or iced tea. The items listed above may not be a complete list of foods and beverages you should avoid. Contact a dietitian for more information.  Questions to ask a health care provider  Do I need to meet with a diabetes educator?  Do I need to meet with a dietitian?  What number can I call if I have questions?  When are the best times to check my blood glucose?  Where to find more information:  American Diabetes Association: diabetes.org  Academy of Nutrition and Dietetics: www.eatright.[employer] of Diabetes and Digestive and Kidney Diseases: [employer]  Association of Diabetes Care and Education Specialists: www.diabeteseducator.org  Summary  It is important to have healthy eating habits because your blood sugar (glucose) levels are greatly affected by what you eat and drink.  A healthy meal plan will help you control your blood glucose and maintain a healthy lifestyle.  Your health care provider may recommend that you work with a dietitian to make a meal plan that is best for you.  Keep in mind that carbohydrates (carbs) and alcohol have immediate effects on your blood glucose  levels. It is important to count carbs and to use alcohol carefully. This information is not intended to replace advice given to you by your health care provider. Make sure you discuss any questions you have with your health care provider. Document Revised: 06/21/2019 Document Reviewed: 06/21/2019 Elsevier Patient Education  2021 [employer].   Patient verbalizes understanding of  instructions provided today.   Telephone follow up appointment with Managed Medicaid care management team member scheduled for:10/16/20 @ 11:15am  [name], RN  Following is a copy of your plan of care:  Patient Care Plan: Gestational Diabetes Management    Problem Identified: Glycemic Management (GDM)   Priority: High  Onset Date: 08/28/2020    Long-Range Goal: Glycemic Management Optimized   Start Date: 08/28/2020  Expected End Date: 10/26/2020  Recent Progress: On track  Priority: High  Note:   CARE PLAN ENTRY Medicaid Managed Care (see longtitudinal plan of care for additional care plan information)  Objective:  Lab Results  Component Value Date   HGBA1C 5.9 (H) 05/03/2020 .   Lab Results  Component Value Date   CREATININE 0.60 05/03/2020   CREATININE 0.57 10/01/2019   CREATININE 0.68 09/30/2019   . Patient reported cbg findings: fasting 86-98, postprandial 83-126  Current Barriers:  Marland Kitchen Knowledge Deficits related to basic Diabetes pathophysiology and self care/management-Natasha [name] is managing her GDM, diagnosed in the first trimester, at home with diet and exercise. She has not been able to check her BS since 08/18/20 due to running out of lancets. She has not started taking the prescribed metformin, due to concern of side effects. She had an OB appointment today that has been rescheduled to 08/29/20, in which she is planning to attend. Natasha [name] has a history of preterm delivery and is taking Makena injections. She has not attended and is not aware of an appointment for fetal echo-Update-Natasha [name] has all supplies for checking her blood sugar. She is checking 4 times a day. She has started taking metformin once daily with mild side effects. Currently waiting on a call from Dr. Gladstone Pih office to schedule fetal echo.  Case Manager Clinical Goal(s):  Marland Kitchen Over the next 30 days, patient will demonstrate improved adherence to prescribed treatment plan for gestational diabetes self  care/management as evidenced by:  . adherence to ADA/ carb modified diet . adherence to prescribed medication regimen . Checking blood sugars 4 times a day(fasting and 2 hrs after each meal) as directed . Attending MFM and OB appointment on 09/18/20 . Scheduling and attending appointment for fetal echo  Interventions:  . Provided education to patient about basic DM disease process and diabetic diet-resent diabetic diet education . Reviewed medications with patient and discussed importance of medication adherence, taking metformin with a meal, and refills for lancets available upon request . Discussed plans with patient for ongoing care management follow up and provided patient with direct contact information for care management team . Reviewed scheduled/upcoming provider appointments including: 09/18/20 in MFM and OB office . Advised patient, providing education and rationale, to check cbg 4 times daily and record, calling OB provider for findings outside established parameters. . Review of patient status, including review of consultants reports, relevant laboratory and other test results, and medications completed. Natasha [name] with OB provider for scheduling of fetal echo . Discussed appropriate snacks for between meals and at bedtime  Patient Self Care Activities:  . schedule appointment for fetal echo and attend-Waiting on a call from Dr. Gladstone Pih office to call  and schedule fetal echo appointment per note on 09/04/20 in Epic . attend scheduled appointments including MFM and OB appointment on 09/18/20 . call to cancel if needed . keep a calendar with prescription refill dates . keep a calendar with appointment dates  . start taking metformin as directed by provider  . call for medicine refill 2 or 3 days before it runs out . use an alarm clock or phone to remind me to take my medicine   . check blood sugar at prescribed times . check blood sugar if I feel it is too high or too  low . enter blood sugar readings and medication or insulin into daily log . take the blood sugar log to all doctor visits . take the blood sugar meter to all doctor visits   Please see past updates related to this goal by clicking on the "Past Updates" button in the selected goal

## 2020-09-12 NOTE — Patient Outreach (Signed)
Medicaid Managed Care   Nurse Care Manager Note  09/12/2020 Name:  Natasha [name] MRN:  [id number] DOB:  1986-02-25  Natasha [name] is an 35 y.o. year old female who is a primary patient of Patient, No Pcp Per.  The [facility] Managed Care Coordination team was consulted for assistance with:    Obstetrics healthcare management needs  Natasha [name] was given information about Medicaid Managed Care Coordination team services today. Natasha [name] agreed to services and verbal consent obtained.  Engaged with patient by telephone for follow up visit in response to provider referral for case management and/or care coordination services.   Assessments/Interventions:  Review of past medical history, allergies, medications, health status, including review of consultants reports, laboratory and other test data, was performed as part of comprehensive evaluation and provision of chronic care management services.  SDOH (Social Determinants of Health) assessments and interventions performed:   Care Plan  No Known Allergies  Medications Reviewed Today    Reviewed by [name], RN (Registered Nurse) on 09/12/20 at 1526  Med List Status: <None>  Medication Order Taking? Sig Documenting Provider Last Dose Status Informant  Accu-Chek Softclix Lancets lancets [id number] Yes Use as instructed Natasha Ngo, MD Taking Active            Med Note Natasha [name], Rocsi Hazelbaker A   Wed Sep 12, 2020  3:25 PM)    glucose blood (ACCU-CHEK GUIDE) test strip [id number] Yes Use as instructed Natasha Ngo, MD Taking Active            Med Note Natasha [name], Natasha [name]   Wed Sep 12, 2020  3:25 PM)    HYDROXYprogesterone caproate [facility]) autoinjector 275 mg [id number]   Natasha Flock, MD  Active            Med Note Natasha [name], Natasha [name]   Fri Aug 17, 2020 12:35 PM) Taking per pt  metFORMIN (GLUCOPHAGE) 500 MG tablet [id number] Yes Take 1 tablet (500 mg total) by mouth 2 (two) times daily with a meal. Natasha Ngo, MD Taking Active             Med Note (Natasha [name] A   Wed Sep 12, 2020  3:26 PM) Taking differently, taking once daily  Prenatal Vit-Fe Fumarate-FA (PRENATAL VITAMINS) 28-0.8 MG TABS [id number] Yes Take 1 tablet by mouth daily. [name], [name], DO Taking Active           Patient Active Problem List   Diagnosis Date Noted  . [redacted] weeks gestation of pregnancy 07/24/2020  . Alpha thalassemia silent carrier 06/15/2020  . AMA (advanced maternal age) multigravida 35+ 05/31/2020  . Supervision of high risk pregnancy, antepartum 05/03/2020  . History of gestational diabetes in prior pregnancy, currently pregnant 05/03/2020  . History of four preterm deliveries, currently pregnant 05/03/2020  . Otorrhea, left 05/02/2020  . Gestational diabetes mellitus (GDM), antepartum 06/03/2017  . History of postpartum depression, currently pregnant 02/21/2011    Conditions to be addressed/monitored per PCP order:  Obstetrics healthcare needs  Care Plan : Gestational Diabetes Management  Updates made by [name], RN since 09/12/2020 12:00 AM    Problem: Glycemic Management (GDM)   Priority: High  Onset Date: 08/28/2020    Long-Range Goal: Glycemic Management Optimized   Start Date: 08/28/2020  Expected End Date: 10/26/2020  Recent Progress: On track  Priority: High  Note:   CARE PLAN ENTRY Medicaid Managed Care (see longtitudinal plan of care for additional care  plan information)  Objective:  Lab Results  Component Value Date   HGBA1C 5.9 (H) 05/03/2020 .   Lab Results  Component Value Date   CREATININE 0.60 05/03/2020   CREATININE 0.57 10/01/2019   CREATININE 0.68 09/30/2019   . Patient reported cbg findings: fasting 86-98, postprandial 83-126  Current Barriers:  Marland Kitchen Knowledge Deficits related to basic Diabetes pathophysiology and self care/management-Natasha [name] is managing her GDM, diagnosed in the first trimester, at home with diet and exercise. She has not been able to check her BS since 08/18/20 due to  running out of lancets. She has not started taking the prescribed metformin, due to concern of side effects. She had an OB appointment today that has been rescheduled to 08/29/20, in which she is planning to attend. Natasha [name] has a history of preterm delivery and is taking Makena injections. She has not attended and is not aware of an appointment for fetal echo-Update-Natasha [name] has all supplies for checking her blood sugar. She is checking 4 times a day. She has started taking metformin once daily with mild side effects. Currently waiting on a call from Dr. Gladstone Pih office to call and schedule fetal echo.  Case Manager Clinical Goal(s):  Marland Kitchen Over the next 30 days, patient will demonstrate improved adherence to prescribed treatment plan for gestational diabetes self care/management as evidenced by:  . adherence to ADA/ carb modified diet . adherence to prescribed medication regimen . Checking blood sugars 4 times a day(fasting and 2 hrs after each meal) as directed . Attending MFM and OB appointment on 09/18/20 . Scheduling and attending appointment for fetal echo  Interventions:  . Provided education to patient about basic DM disease process and diabetic diet-resent diabetic diet education . Reviewed medications with patient and discussed importance of medication adherence, taking metformin with a meal, and refills for lancets available upon request . Discussed plans with patient for ongoing care management follow up and provided patient with direct contact information for care management team . Reviewed scheduled/upcoming provider appointments including: 09/18/20 in MFM and OB office . Advised patient, providing education and rationale, to check cbg 4 times daily and record, calling OB provider for findings outside established parameters. . Review of patient status, including review of consultants reports, relevant laboratory and other test results, and medications completed. [name] with OB  provider for scheduling of fetal echo . Discussed appropriate snacks for between meals and at bedtime  Patient Self Care Activities:  . schedule appointment for fetal echo and attend-Waiting on a call from Dr. Gladstone Pih office to call and schedule fetal echo appointment per note on 09/04/20 in [place] . attend scheduled appointments including MFM and OB appointment on 09/18/20 . call to cancel if needed . keep a calendar with prescription refill dates . keep a calendar with appointment dates  . start taking metformin as directed by provider  . call for medicine refill 2 or 3 days before it runs out . use an alarm clock or phone to remind me to take my medicine   . check blood sugar at prescribed times . check blood sugar if I feel it is too high or too low . enter blood sugar readings and medication or insulin into daily log . take the blood sugar log to all doctor visits . take the blood sugar meter to all doctor visits   Please see past updates related to this goal by clicking on the "Past Updates" button in the selected goal  Follow Up:  Patient agrees to Care Plan and Follow-up.  Plan: The Managed Medicaid care management team will reach out to the patient again over the next 30 days.  Date/time of next scheduled RN care management/care coordination outreach: 10/16/20 @ 11:15am  Lurena Joiner RN, Dora RN Care Coordinator

## 2020-09-18 ENCOUNTER — Ambulatory Visit (INDEPENDENT_AMBULATORY_CARE_PROVIDER_SITE_OTHER): Payer: Medicaid Other | Admitting: General Practice

## 2020-09-18 ENCOUNTER — Ambulatory Visit: Payer: Medicaid Other | Admitting: *Deleted

## 2020-09-18 ENCOUNTER — Other Ambulatory Visit: Payer: Self-pay | Admitting: *Deleted

## 2020-09-18 ENCOUNTER — Telehealth: Payer: Self-pay | Admitting: Lactation Services

## 2020-09-18 ENCOUNTER — Ambulatory Visit: Payer: Medicaid Other | Attending: Obstetrics and Gynecology

## 2020-09-18 ENCOUNTER — Other Ambulatory Visit: Payer: Self-pay

## 2020-09-18 ENCOUNTER — Encounter: Payer: Self-pay | Admitting: *Deleted

## 2020-09-18 VITALS — BP 102/70 | HR 97 | Ht 61.0 in | Wt 153.0 lb

## 2020-09-18 DIAGNOSIS — O24415 Gestational diabetes mellitus in pregnancy, controlled by oral hypoglycemic drugs: Secondary | ICD-10-CM | POA: Diagnosis not present

## 2020-09-18 DIAGNOSIS — Z362 Encounter for other antenatal screening follow-up: Secondary | ICD-10-CM

## 2020-09-18 DIAGNOSIS — O09523 Supervision of elderly multigravida, third trimester: Secondary | ICD-10-CM

## 2020-09-18 DIAGNOSIS — O09899 Supervision of other high risk pregnancies, unspecified trimester: Secondary | ICD-10-CM

## 2020-09-18 DIAGNOSIS — O24419 Gestational diabetes mellitus in pregnancy, unspecified control: Secondary | ICD-10-CM | POA: Diagnosis not present

## 2020-09-18 DIAGNOSIS — Z3A28 28 weeks gestation of pregnancy: Secondary | ICD-10-CM

## 2020-09-18 DIAGNOSIS — O0990 Supervision of high risk pregnancy, unspecified, unspecified trimester: Secondary | ICD-10-CM | POA: Diagnosis not present

## 2020-09-18 DIAGNOSIS — O09213 Supervision of pregnancy with history of pre-term labor, third trimester: Secondary | ICD-10-CM

## 2020-09-18 DIAGNOSIS — Z148 Genetic carrier of other disease: Secondary | ICD-10-CM

## 2020-09-18 NOTE — Telephone Encounter (Signed)
Elizabeth at [facility] called to arrange delivery of [place].    Returned Pharmacy's call. Spoke with [place] Informed her that prescription needs to be delivered to the office, not the patients home.   Pharmacy informed it will be sent Fed Ex overnight and should received by February 25.

## 2020-09-18 NOTE — Progress Notes (Signed)
Suann Larry Commons here for 17-P  Injection.  Injection administered without complication. Patient will return in one week for next injection.  [name], RN 09/18/2020  2:40 PM

## 2020-09-19 NOTE — Progress Notes (Signed)
I have reviewed the chart and agree with nursing staff's documentation of this patient's encounter.  Laury Deep, CNM 09/19/2020 8:26 PM

## 2020-09-25 ENCOUNTER — Encounter: Payer: Self-pay | Admitting: Family Medicine

## 2020-09-25 ENCOUNTER — Ambulatory Visit (INDEPENDENT_AMBULATORY_CARE_PROVIDER_SITE_OTHER): Payer: Medicaid Other | Admitting: Family Medicine

## 2020-09-25 ENCOUNTER — Other Ambulatory Visit: Payer: Self-pay

## 2020-09-25 VITALS — BP 106/73 | HR 92 | Wt 154.9 lb

## 2020-09-25 DIAGNOSIS — Z23 Encounter for immunization: Secondary | ICD-10-CM | POA: Diagnosis not present

## 2020-09-25 DIAGNOSIS — O09892 Supervision of other high risk pregnancies, second trimester: Secondary | ICD-10-CM | POA: Diagnosis not present

## 2020-09-25 DIAGNOSIS — Z3009 Encounter for other general counseling and advice on contraception: Secondary | ICD-10-CM

## 2020-09-25 DIAGNOSIS — O24419 Gestational diabetes mellitus in pregnancy, unspecified control: Secondary | ICD-10-CM

## 2020-09-25 DIAGNOSIS — O24410 Gestational diabetes mellitus in pregnancy, diet controlled: Secondary | ICD-10-CM

## 2020-09-25 DIAGNOSIS — O09899 Supervision of other high risk pregnancies, unspecified trimester: Secondary | ICD-10-CM | POA: Diagnosis not present

## 2020-09-25 DIAGNOSIS — O99891 Other specified diseases and conditions complicating pregnancy: Secondary | ICD-10-CM

## 2020-09-25 DIAGNOSIS — O0990 Supervision of high risk pregnancy, unspecified, unspecified trimester: Secondary | ICD-10-CM

## 2020-09-25 DIAGNOSIS — Z8659 Personal history of other mental and behavioral disorders: Secondary | ICD-10-CM

## 2020-09-25 DIAGNOSIS — O09523 Supervision of elderly multigravida, third trimester: Secondary | ICD-10-CM

## 2020-09-25 MED ORDER — METFORMIN HCL 1000 MG PO TABS: 500.0000 mg | ORAL_TABLET | Freq: Two times a day (BID) | ORAL | 5 refills | Status: DC

## 2020-09-25 NOTE — Patient Instructions (Signed)
Contraception Choices Contraception, also called birth control, refers to methods or devices that prevent pregnancy. Hormonal methods Contraceptive implant A contraceptive implant is a thin, plastic tube that contains a hormone that prevents pregnancy. It is different from an intrauterine device (IUD). It is inserted into the upper part of the arm by a health care provider. Implants can be effective for up to 3 years. Progestin-only injections Progestin-only injections are injections of progestin, a synthetic form of the hormone progesterone. They are given every 3 months by a health care provider. Birth control pills Birth control pills are pills that contain hormones that prevent pregnancy. They must be taken once a day, preferably at the same time each day. A prescription is needed to use this method of contraception. Birth control patch The birth control patch contains hormones that prevent pregnancy. It is placed on the skin and must be changed once a week for three weeks and removed on the fourth week. A prescription is needed to use this method of contraception. Vaginal ring A vaginal ring contains hormones that prevent pregnancy. It is placed in the vagina for three weeks and removed on the fourth week. After that, the process is repeated with a new ring. A prescription is needed to use this method of contraception. Emergency contraceptive Emergency contraceptives prevent pregnancy after unprotected sex. They come in pill form and can be taken up to 5 days after sex. They work best the sooner they are taken after having sex. Most emergency contraceptives are available without a prescription. This method should not be used as your only form of birth control.   Barrier methods Female condom A female condom is a thin sheath that is worn over the penis during sex. Condoms keep sperm from going inside a woman's body. They can be used with a sperm-killing substance (spermicide) to increase their  effectiveness. They should be thrown away after one use. Female condom A female condom is a soft, loose-fitting sheath that is put into the vagina before sex. The condom keeps sperm from going inside a woman's body. They should be thrown away after one use. Diaphragm A diaphragm is a soft, dome-shaped barrier. It is inserted into the vagina before sex, along with a spermicide. The diaphragm blocks sperm from entering the uterus, and the spermicide kills sperm. A diaphragm should be left in the vagina for 6-8 hours after sex and removed within 24 hours. A diaphragm is prescribed and fitted by a health care provider. A diaphragm should be replaced every 1-2 years, after giving birth, after gaining more than 15 lb (6.8 kg), and after pelvic surgery. Cervical cap A cervical cap is a round, soft latex or plastic cup that fits over the cervix. It is inserted into the vagina before sex, along with spermicide. It blocks sperm from entering the uterus. The cap should be left in place for 6-8 hours after sex and removed within 48 hours. A cervical cap must be prescribed and fitted by a health care provider. It should be replaced every 2 years. Sponge A sponge is a soft, circular piece of polyurethane foam with spermicide in it. The sponge helps block sperm from entering the uterus, and the spermicide kills sperm. To use it, you make it wet and then insert it into the vagina. It should be inserted before sex, left in for at least 6 hours after sex, and removed and thrown away within 30 hours. Spermicides Spermicides are chemicals that kill or block sperm from entering the  cervix and uterus. They can come as a cream, jelly, suppository, foam, or tablet. A spermicide should be inserted into the vagina with an applicator at least 41-66 minutes before sex to allow time for it to work. The process must be repeated every time you have sex. Spermicides do not require a prescription.   Intrauterine  contraception Intrauterine device (IUD) An IUD is a T-shaped device that is put in a woman's uterus. There are two types:  Hormone IUD.This type contains progestin, a synthetic form of the hormone progesterone. This type can stay in place for 3-5 years.  Copper IUD.This type is wrapped in copper wire. It can stay in place for 10 years. Permanent methods of contraception Female tubal ligation In this method, a woman's fallopian tubes are sealed, tied, or blocked during surgery to prevent eggs from traveling to the uterus. Hysteroscopic sterilization In this method, a small, flexible insert is placed into each fallopian tube. The inserts cause scar tissue to form in the fallopian tubes and block them, so sperm cannot reach an egg. The procedure takes about 3 months to be effective. Another form of birth control must be used during those 3 months. Female sterilization This is a procedure to tie off the tubes that carry sperm (vasectomy). After the procedure, the man can still ejaculate fluid (semen). Another form of birth control must be used for 3 months after the procedure. Natural planning methods Natural family planning In this method, a couple does not have sex on days when the woman could become pregnant. Calendar method In this method, the woman keeps track of the length of each menstrual cycle, identifies the days when pregnancy can happen, and does not have sex on those days. Ovulation method In this method, a couple avoids sex during ovulation. Symptothermal method This method involves not having sex during ovulation. The woman typically checks for ovulation by watching changes in her temperature and in the consistency of cervical mucus. Post-ovulation method In this method, a couple waits to have sex until after ovulation. Where to find more information  Centers for Disease Control and Prevention: http://www.wolf.info/ Summary  Contraception, also called birth control, refers to methods or  devices that prevent pregnancy.  Hormonal methods of contraception include implants, injections, pills, patches, vaginal rings, and emergency contraceptives.  Barrier methods of contraception can include female condoms, female condoms, diaphragms, cervical caps, sponges, and spermicides.  There are two types of IUDs (intrauterine devices). An IUD can be put in a woman's uterus to prevent pregnancy for 3-5 years.  Permanent sterilization can be done through a procedure for males and females. Natural family planning methods involve nothaving sex on days when the woman could become pregnant. This information is not intended to replace advice given to you by your health care provider. Make sure you discuss any questions you have with your health care provider. Document Revised: 12/19/2019 Document Reviewed: 12/19/2019 Elsevier Patient Education  [employer].   Breastfeeding  Choosing to breastfeed is one of the best decisions you can make for yourself and your baby. A change in hormones during pregnancy causes your breasts to make breast milk in your milk-producing glands. Hormones prevent breast milk from being released before your baby is born. They also prompt milk flow after birth. Once breastfeeding has begun, thoughts of your baby, as well as his or her sucking or crying, can stimulate the release of milk from your milk-producing glands. Benefits of breastfeeding Research shows that breastfeeding offers many health benefits  for infants and mothers. It also offers a cost-free and convenient way to feed your baby. For your baby  Your first milk (colostrum) helps your baby's digestive system to function better.  Special cells in your milk (antibodies) help your baby to fight off infections.  Breastfed babies are less likely to develop asthma, allergies, obesity, or type 2 diabetes. They are also at lower risk for sudden infant death syndrome (SIDS).  Nutrients in breast milk are better  able to meet your baby's needs compared to infant formula.  Breast milk improves your baby's brain development. For you  Breastfeeding helps to create a very special bond between you and your baby.  Breastfeeding is convenient. Breast milk costs nothing and is always available at the correct temperature.  Breastfeeding helps to burn calories. It helps you to lose the weight that you gained during pregnancy.  Breastfeeding makes your uterus return faster to its size before pregnancy. It also slows bleeding (lochia) after you give birth.  Breastfeeding helps to lower your risk of developing type 2 diabetes, osteoporosis, rheumatoid arthritis, cardiovascular disease, and breast, ovarian, uterine, and endometrial cancer later in life. Breastfeeding basics Starting breastfeeding  Find a comfortable place to sit or lie down, with your neck and back well-supported.  Place a pillow or a rolled-up blanket under your baby to bring him or her to the level of your breast (if you are seated). Nursing pillows are specially designed to help support your arms and your baby while you breastfeed.  Make sure that your baby's tummy (abdomen) is facing your abdomen.  Gently massage your breast. With your fingertips, massage from the outer edges of your breast inward toward the nipple. This encourages milk flow. If your milk flows slowly, you may need to continue this action during the feeding.  Support your breast with 4 fingers underneath and your thumb above your nipple (make the letter "C" with your hand). Make sure your fingers are well away from your nipple and your baby's mouth.  Stroke your baby's lips gently with your finger or nipple.  When your baby's mouth is open wide enough, quickly bring your baby to your breast, placing your entire nipple and as much of the areola as possible into your baby's mouth. The areola is the colored area around your nipple. ? More areola should be visible above your  baby's upper lip than below the lower lip. ? Your baby's lips should be opened and extended outward (flanged) to ensure an adequate, comfortable latch. ? Your baby's tongue should be between his or her lower gum and your breast.  Make sure that your baby's mouth is correctly positioned around your nipple (latched). Your baby's lips should create a seal on your breast and be turned out (everted).  It is common for your baby to suck about 2-3 minutes in order to start the flow of breast milk. Latching Teaching your baby how to latch onto your breast properly is very important. An improper latch can cause nipple pain, decreased milk supply, and poor weight gain in your baby. Also, if your baby is not latched onto your nipple properly, he or she may swallow some air during feeding. This can make your baby fussy. Burping your baby when you switch breasts during the feeding can help to get rid of the air. However, teaching your baby to latch on properly is still the best way to prevent fussiness from swallowing air while breastfeeding. Signs that your baby has successfully latched onto  your nipple  Silent tugging or silent sucking, without causing you pain. Infant's lips should be extended outward (flanged).  Swallowing heard between every 3-4 sucks once your milk has started to flow (after your let-down milk reflex occurs).  Muscle movement above and in front of his or her ears while sucking. Signs that your baby has not successfully latched onto your nipple  Sucking sounds or smacking sounds from your baby while breastfeeding.  Nipple pain. If you think your baby has not latched on correctly, slip your finger into the corner of your baby's mouth to break the suction and place it between your baby's gums. Attempt to start breastfeeding again. Signs of successful breastfeeding Signs from your baby  Your baby will gradually decrease the number of sucks or will completely stop sucking.  Your baby  will fall asleep.  Your baby's body will relax.  Your baby will retain a small amount of milk in his or her mouth.  Your baby will let go of your breast by himself or herself. Signs from you  Breasts that have increased in firmness, weight, and size 1-3 hours after feeding.  Breasts that are softer immediately after breastfeeding.  Increased milk volume, as well as a change in milk consistency and color by the fifth day of breastfeeding.  Nipples that are not sore, cracked, or bleeding. Signs that your baby is getting enough milk  Wetting at least 1-2 diapers during the first 24 hours after birth.  Wetting at least 5-6 diapers every 24 hours for the first week after birth. The urine should be clear or pale yellow by the age of 5 days.  Wetting 6-8 diapers every 24 hours as your baby continues to grow and develop.  At least 3 stools in a 24-hour period by the age of 5 days. The stool should be soft and yellow.  At least 3 stools in a 24-hour period by the age of 7 days. The stool should be seedy and yellow.  No loss of weight greater than 10% of birth weight during the first 3 days of life.  Average weight gain of 4-7 oz (113-198 g) per week after the age of 4 days.  Consistent daily weight gain by the age of 5 days, without weight loss after the age of 2 weeks. After a feeding, your baby may spit up a small amount of milk. This is normal. Breastfeeding frequency and duration Frequent feeding will help you make more milk and can prevent sore nipples and extremely full breasts (breast engorgement). Breastfeed when you feel the need to reduce the fullness of your breasts or when your baby shows signs of hunger. This is called "breastfeeding on demand." Signs that your baby is hungry include:  Increased alertness, activity, or restlessness.  Movement of the head from side to side.  Opening of the mouth when the corner of the mouth or cheek is stroked (rooting).  Increased  sucking sounds, smacking lips, cooing, sighing, or squeaking.  Hand-to-mouth movements and sucking on fingers or hands.  Fussing or crying. Avoid introducing a pacifier to your baby in the first 4-6 weeks after your baby is born. After this time, you may choose to use a pacifier. Research has shown that pacifier use during the first year of a baby's life decreases the risk of sudden infant death syndrome (SIDS). Allow your baby to feed on each breast as long as he or she wants. When your baby unlatches or falls asleep while feeding from the  first breast, offer the second breast. Because newborns are often sleepy in the first few weeks of life, you may need to awaken your baby to get him or her to feed. Breastfeeding times will vary from baby to baby. However, the following rules can serve as a guide to help you make sure that your baby is properly fed:  Newborns (babies 33 weeks of age or younger) may breastfeed every 1-3 hours.  Newborns should not go without breastfeeding for longer than 3 hours during the day or 5 hours during the night.  You should breastfeed your baby a minimum of 8 times in a 24-hour period. Breast milk pumping Pumping and storing breast milk allows you to make sure that your baby is exclusively fed your breast milk, even at times when you are unable to breastfeed. This is especially important if you go back to work while you are still breastfeeding, or if you are not able to be present during feedings. Your lactation consultant can help you find a method of pumping that works best for you and give you guidelines about how long it is safe to store breast milk.      Caring for your breasts while you breastfeed Nipples can become dry, cracked, and sore while breastfeeding. The following recommendations can help keep your breasts moisturized and healthy:  Avoid using soap on your nipples.  Wear a supportive bra designed especially for nursing. Avoid wearing underwire-style  bras or extremely tight bras (sports bras).  Air-dry your nipples for 3-4 minutes after each feeding.  Use only cotton bra pads to absorb leaked breast milk. Leaking of breast milk between feedings is normal.  Use lanolin on your nipples after breastfeeding. Lanolin helps to maintain your skin's normal moisture barrier. Pure lanolin is not harmful (not toxic) to your baby. You may also hand express a few drops of breast milk and gently massage that milk into your nipples and allow the milk to air-dry. In the first few weeks after giving birth, some women experience breast engorgement. Engorgement can make your breasts feel heavy, warm, and tender to the touch. Engorgement peaks within 3-5 days after you give birth. The following recommendations can help to ease engorgement:  Completely empty your breasts while breastfeeding or pumping. You may want to start by applying warm, moist heat (in the shower or with warm, water-soaked hand towels) just before feeding or pumping. This increases circulation and helps the milk flow. If your baby does not completely empty your breasts while breastfeeding, pump any extra milk after he or she is finished.  Apply ice packs to your breasts immediately after breastfeeding or pumping, unless this is too uncomfortable for you. To do this: ? Put ice in a plastic bag. ? Place a towel between your skin and the bag. ? Leave the ice on for 20 minutes, 2-3 times a day.  Make sure that your baby is latched on and positioned properly while breastfeeding. If engorgement persists after 48 hours of following these recommendations, contact your health care provider or a Science writer. Overall health care recommendations while breastfeeding  Eat 3 healthy meals and 3 snacks every day. Well-nourished mothers who are breastfeeding need an additional 450-500 calories a day. You can meet this requirement by increasing the amount of a balanced diet that you eat.  Drink  enough water to keep your urine pale yellow or clear.  Rest often, relax, and continue to take your prenatal vitamins to prevent fatigue, stress, and low  vitamin and mineral levels in your body (nutrient deficiencies).  Do not use any products that contain nicotine or tobacco, such as cigarettes and e-cigarettes. Your baby may be harmed by chemicals from cigarettes that pass into breast milk and exposure to secondhand smoke. If you need help quitting, ask your health care provider.  Avoid alcohol.  Do not use illegal drugs or marijuana.  Talk with your health care provider before taking any medicines. These include over-the-counter and prescription medicines as well as vitamins and herbal supplements. Some medicines that may be harmful to your baby can pass through breast milk.  It is possible to become pregnant while breastfeeding. If birth control is desired, ask your health care provider about options that will be safe while breastfeeding your baby. Where to find more information: [employer]: www.llli.org Contact a health care provider if:  You feel like you want to stop breastfeeding or have become frustrated with breastfeeding.  Your nipples are cracked or bleeding.  Your breasts are red, tender, or warm.  You have: ? Painful breasts or nipples. ? A swollen area on either breast. ? A fever or chills. ? Nausea or vomiting. ? Drainage other than breast milk from your nipples.  Your breasts do not become full before feedings by the fifth day after you give birth.  You feel sad and depressed.  Your baby is: ? Too sleepy to eat well. ? Having trouble sleeping. ? More than 64 week old and wetting fewer than 6 diapers in a 24-hour period. ? Not gaining weight by 52 days of age.  Your baby has fewer than 3 stools in a 24-hour period.  Your baby's skin or the white parts of his or her eyes become yellow. Get help right away if:  Your baby is overly tired  (lethargic) and does not want to wake up and feed.  Your baby develops an unexplained fever. Summary  Breastfeeding offers many health benefits for infant and mothers.  Try to breastfeed your infant when he or she shows early signs of hunger.  Gently tickle or stroke your baby's lips with your finger or nipple to allow the baby to open his or her mouth. Bring the baby to your breast. Make sure that much of the areola is in your baby's mouth. Offer one side and burp the baby before you offer the other side.  Talk with your health care provider or lactation consultant if you have questions or you face problems as you breastfeed. This information is not intended to replace advice given to you by your health care provider. Make sure you discuss any questions you have with your health care provider. Document Revised: 10/08/2017 Document Reviewed: 08/15/2016 Elsevier Patient Education  2021 [employer].

## 2020-09-25 NOTE — Progress Notes (Signed)
   Subjective:  Natasha [name] is a 35 y.o. [id number] at [redacted]w[redacted]d being seen today for ongoing prenatal care.  She is currently monitored for the following issues for this high-risk pregnancy and has History of postpartum depression, currently pregnant; Gestational diabetes mellitus (GDM), antepartum; Supervision of high risk pregnancy, antepartum; History of gestational diabetes in prior pregnancy, currently pregnant; History of four preterm deliveries, currently pregnant; AMA (advanced maternal age) multigravida 62+; Alpha thalassemia silent carrier; [redacted] weeks gestation of pregnancy; Otorrhea, left; and Unwanted fertility on their problem list.  Patient reports no complaints.  Contractions: Not present. Vag. Bleeding: None.  Movement: Present. Denies leaking of fluid.   The following portions of the patient's history were reviewed and updated as appropriate: allergies, current medications, past family history, past medical history, past social history, past surgical history and problem list. Problem list updated.  Objective:   Vitals:   09/25/20 1455  BP: 106/73  Pulse: 92  Weight: 154 lb 14.4 oz (70.3 kg)    Fetal Status: Fetal Heart Rate (bpm): 141   Movement: Present     General:  Alert, oriented and cooperative. Patient is in no acute distress.  Skin: Skin is warm and dry. No rash noted.   Cardiovascular: Normal heart rate noted  Respiratory: Normal respiratory effort, no problems with respiration noted  Abdomen: Soft, gravid, appropriate for gestational age. Pain/Pressure: Absent     Pelvic: Vag. Bleeding: None     Cervical exam deferred        Extremities: Normal range of motion.  Edema: None  Mental Status: Normal mood and affect. Normal behavior. Normal judgment and thought content.   Urinalysis:      Assessment and Plan:  Pregnancy: [id number] at [redacted]w[redacted]d  1. Supervision of high risk pregnancy, antepartum BP and FHR normal 3rd trimester labs and TDaP today - CBC - RPR - HIV  antibody (with reflex)  2. Diet controlled gestational diabetes mellitus (GDM), antepartum Only sporadically checking sugars and taking metformin 500 mg only once daily Fasting and breakfast PP are mostly within goal, remainder are mostly not Discussed in detail risks of uncontrolled GDM including shoulder dystocia and attendant complications (humeral fractures, palsies) as well as risk of stillbirth Patient reported she was unaware of these risks Recommended she do QID checks and increase metformin to 1g BID, maintain diabetic diet She will try to do at least TID checks (fasting, lunch, dinner)  3. Unwanted fertility Desires BTL Counseled on permanence, risks Papers signed  4. Multigravida of advanced maternal age in third trimester   5. History of four preterm deliveries, currently pregnant On makena  6. History of postpartum depression, currently pregnant Mood stable  Preterm labor symptoms and general obstetric precautions including but not limited to vaginal bleeding, contractions, leaking of fluid and fetal movement were reviewed in detail with the patient. Please refer to After Visit Summary for other counseling recommendations.  Return in 2 weeks (on 10/09/2020) for [facility], ob visit, needs MD.   [name], MD

## 2020-09-25 NOTE — Addendum Note (Signed)
Addended by: Bethanne Ginger on: 09/25/2020 04:03 PM   Modules accepted: Orders

## 2020-09-25 NOTE — Progress Notes (Signed)
Pt has Paper Log for Glucose

## 2020-09-25 NOTE — Progress Notes (Unsigned)
BTL Consent signed-09/25/20

## 2020-09-26 LAB — CBC
Hematocrit: 35.7 % (ref 34.0–46.6)
Hemoglobin: 11.9 g/dL (ref 11.1–15.9)
MCH: 29.6 pg (ref 26.6–33.0)
MCHC: 33.3 g/dL (ref 31.5–35.7)
MCV: 89 fL (ref 79–97)
Platelets: 249 10*3/uL (ref 150–450)
RBC: 4.02 x10E6/uL (ref 3.77–5.28)
RDW: 12.5 % (ref 11.7–15.4)
WBC: 7.3 10*3/uL (ref 3.4–10.8)

## 2020-09-26 LAB — RPR: RPR Ser Ql: NONREACTIVE

## 2020-09-26 LAB — HIV ANTIBODY (ROUTINE TESTING W REFLEX): HIV Screen 4th Generation wRfx: NONREACTIVE

## 2020-10-02 ENCOUNTER — Other Ambulatory Visit: Payer: Self-pay

## 2020-10-02 ENCOUNTER — Encounter: Payer: Self-pay | Admitting: Lactation Services

## 2020-10-02 ENCOUNTER — Ambulatory Visit (INDEPENDENT_AMBULATORY_CARE_PROVIDER_SITE_OTHER): Payer: Medicaid Other | Admitting: Lactation Services

## 2020-10-02 VITALS — BP 122/71 | HR 98 | Ht 61.0 in | Wt 155.4 lb

## 2020-10-02 DIAGNOSIS — O09899 Supervision of other high risk pregnancies, unspecified trimester: Secondary | ICD-10-CM

## 2020-10-02 DIAGNOSIS — O0990 Supervision of high risk pregnancy, unspecified, unspecified trimester: Secondary | ICD-10-CM

## 2020-10-02 DIAGNOSIS — O09893 Supervision of other high risk pregnancies, third trimester: Secondary | ICD-10-CM | POA: Diagnosis not present

## 2020-10-02 NOTE — Progress Notes (Signed)
Patient here for Makena injection, given in right arm, patient tolerated well. She is to return next week for follow up injection.

## 2020-10-02 NOTE — Progress Notes (Signed)
Chart reviewed for nurse visit. Agree with plan of care.   [name], MD 10/02/20 5:03 PM

## 2020-10-09 ENCOUNTER — Other Ambulatory Visit: Payer: Self-pay

## 2020-10-09 ENCOUNTER — Encounter: Payer: Self-pay | Admitting: Family Medicine

## 2020-10-09 ENCOUNTER — Ambulatory Visit (INDEPENDENT_AMBULATORY_CARE_PROVIDER_SITE_OTHER): Payer: Medicaid Other | Admitting: Family Medicine

## 2020-10-09 ENCOUNTER — Ambulatory Visit: Payer: Medicaid Other

## 2020-10-09 VITALS — BP 108/60 | Wt 154.7 lb

## 2020-10-09 DIAGNOSIS — O0990 Supervision of high risk pregnancy, unspecified, unspecified trimester: Secondary | ICD-10-CM

## 2020-10-09 DIAGNOSIS — O99891 Other specified diseases and conditions complicating pregnancy: Secondary | ICD-10-CM

## 2020-10-09 DIAGNOSIS — Z8659 Personal history of other mental and behavioral disorders: Secondary | ICD-10-CM

## 2020-10-09 DIAGNOSIS — O24410 Gestational diabetes mellitus in pregnancy, diet controlled: Secondary | ICD-10-CM

## 2020-10-09 DIAGNOSIS — O09899 Supervision of other high risk pregnancies, unspecified trimester: Secondary | ICD-10-CM

## 2020-10-09 DIAGNOSIS — O09523 Supervision of elderly multigravida, third trimester: Secondary | ICD-10-CM

## 2020-10-09 DIAGNOSIS — Z3009 Encounter for other general counseling and advice on contraception: Secondary | ICD-10-CM

## 2020-10-09 NOTE — Progress Notes (Signed)
Pt states having a lot of pain mostly on right side.  Called [facility] @ [phone] to get Makena refill, Pt only has 1 more left. Was advised per Angelica can not process order until 10/17/20 but she stated will start process.

## 2020-10-09 NOTE — Progress Notes (Signed)
   Subjective:  Natasha [name] is a 35 y.o. [id number] at [redacted]w[redacted]d being seen today for ongoing prenatal care.  She is currently monitored for the following issues for this high-risk pregnancy and has History of postpartum depression, currently pregnant; Gestational diabetes mellitus (GDM), antepartum; Supervision of high risk pregnancy, antepartum; History of gestational diabetes in prior pregnancy, currently pregnant; History of four preterm deliveries, currently pregnant; AMA (advanced maternal age) multigravida 33+; Alpha thalassemia silent carrier; [redacted] weeks gestation of pregnancy; Otorrhea, left; and Unwanted fertility on their problem list.  Patient reports no complaints.  Contractions: Irritability. Vag. Bleeding: None.  Movement: Present. Denies leaking of fluid.   The following portions of the patient's history were reviewed and updated as appropriate: allergies, current medications, past family history, past medical history, past social history, past surgical history and problem list. Problem list updated.  Objective:   Vitals:   10/09/20 1427  BP: 108/60  Weight: 154 lb 11.2 oz (70.2 kg)    Fetal Status: Fetal Heart Rate (bpm): 135   Movement: Present     General:  Alert, oriented and cooperative. Patient is in no acute distress.  Skin: Skin is warm and dry. No rash noted.   Cardiovascular: Normal heart rate noted  Respiratory: Normal respiratory effort, no problems with respiration noted  Abdomen: Soft, gravid, appropriate for gestational age. Pain/Pressure: Present     Pelvic: Vag. Bleeding: None     Cervical exam deferred        Extremities: Normal range of motion.  Edema: None  Mental Status: Normal mood and affect. Normal behavior. Normal judgment and thought content.   Urinalysis:      Assessment and Plan:  Pregnancy: [id number] at [redacted]w[redacted]d  1. Diet controlled gestational diabetes mellitus (GDM), antepartum Sugars 89% at goal, excellent improvement, patient congratulated Last  EFW 80% last month, has f/u with weekly BPP scheduled for next week Missed fetal echo appt, emphasized importance and rescheduled - US Fetal Echocardiography; Future  2. Supervision of high risk pregnancy, antepartum BP and FHR normal  3. Unwanted fertility Signed BTL papers at last visit  4. Multigravida of advanced maternal age in third trimester   5. History of four preterm deliveries, currently pregnant On Makena  6. History of postpartum depression, currently pregnant Mood stable today  Preterm labor symptoms and general obstetric precautions including but not limited to vaginal bleeding, contractions, leaking of fluid and fetal movement were reviewed in detail with the patient. Please refer to After Visit Summary for other counseling recommendations.  Return in 2 weeks (on 10/23/2020) for [facility], ob visit.   [name], MD

## 2020-10-09 NOTE — Patient Instructions (Addendum)
Your fetal echo has been re-scheduled for November 07, 2020 at 9:30 AM at [facility] with Dr. Filbert Schilder.   Breastfeeding  Choosing to breastfeed is one of the best decisions you can make for yourself and your baby. A change in hormones during pregnancy causes your breasts to make breast milk in your milk-producing glands. Hormones prevent breast milk from being released before your baby is born. They also prompt milk flow after birth. Once breastfeeding has begun, thoughts of your baby, as well as his or her sucking or crying, can stimulate the release of milk from your milk-producing glands. Benefits of breastfeeding Research shows that breastfeeding offers many health benefits for infants and mothers. It also offers a cost-free and convenient way to feed your baby. For your baby  Your first milk (colostrum) helps your baby's digestive system to function better.  Special cells in your milk (antibodies) help your baby to fight off infections.  Breastfed babies are less likely to develop asthma, allergies, obesity, or type 2 diabetes. They are also at lower risk for sudden infant death syndrome (SIDS).  Nutrients in breast milk are better able to meet your baby's needs compared to infant formula.  Breast milk improves your baby's brain development. For you  Breastfeeding helps to create a very special bond between you and your baby.  Breastfeeding is convenient. Breast milk costs nothing and is always available at the correct temperature.  Breastfeeding helps to burn calories. It helps you to lose the weight that you gained during pregnancy.  Breastfeeding makes your uterus return faster to its size before pregnancy. It also slows bleeding (lochia) after you give birth.  Breastfeeding helps to lower your risk of developing type 2 diabetes, osteoporosis, rheumatoid arthritis, cardiovascular disease, and breast, ovarian, uterine, and endometrial cancer later in  life. Breastfeeding basics Starting breastfeeding  Find a comfortable place to sit or lie down, with your neck and back well-supported.  Place a pillow or a rolled-up blanket under your baby to bring him or her to the level of your breast (if you are seated). Nursing pillows are specially designed to help support your arms and your baby while you breastfeed.  Make sure that your baby's tummy (abdomen) is facing your abdomen.  Gently massage your breast. With your fingertips, massage from the outer edges of your breast inward toward the nipple. This encourages milk flow. If your milk flows slowly, you may need to continue this action during the feeding.  Support your breast with 4 fingers underneath and your thumb above your nipple (make the letter "C" with your hand). Make sure your fingers are well away from your nipple and your baby's mouth.  Stroke your baby's lips gently with your finger or nipple.  When your baby's mouth is open wide enough, quickly bring your baby to your breast, placing your entire nipple and as much of the areola as possible into your baby's mouth. The areola is the colored area around your nipple. ? More areola should be visible above your baby's upper lip than below the lower lip. ? Your baby's lips should be opened and extended outward (flanged) to ensure an adequate, comfortable latch. ? Your baby's tongue should be between his or her lower gum and your breast.  Make sure that your baby's mouth is correctly positioned around your nipple (latched). Your baby's lips should create a seal on your breast and be turned out (everted).  It is common for your baby to suck about 2-3  minutes in order to start the flow of breast milk. Latching Teaching your baby how to latch onto your breast properly is very important. An improper latch can cause nipple pain, decreased milk supply, and poor weight gain in your baby. Also, if your baby is not latched onto your nipple  properly, he or she may swallow some air during feeding. This can make your baby fussy. Burping your baby when you switch breasts during the feeding can help to get rid of the air. However, teaching your baby to latch on properly is still the best way to prevent fussiness from swallowing air while breastfeeding. Signs that your baby has successfully latched onto your nipple  Silent tugging or silent sucking, without causing you pain. Infant's lips should be extended outward (flanged).  Swallowing heard between every 3-4 sucks once your milk has started to flow (after your let-down milk reflex occurs).  Muscle movement above and in front of his or her ears while sucking. Signs that your baby has not successfully latched onto your nipple  Sucking sounds or smacking sounds from your baby while breastfeeding.  Nipple pain. If you think your baby has not latched on correctly, slip your finger into the corner of your baby's mouth to break the suction and place it between your baby's gums. Attempt to start breastfeeding again. Signs of successful breastfeeding Signs from your baby  Your baby will gradually decrease the number of sucks or will completely stop sucking.  Your baby will fall asleep.  Your baby's body will relax.  Your baby will retain a small amount of milk in his or her mouth.  Your baby will let go of your breast by himself or herself. Signs from you  Breasts that have increased in firmness, weight, and size 1-3 hours after feeding.  Breasts that are softer immediately after breastfeeding.  Increased milk volume, as well as a change in milk consistency and color by the fifth day of breastfeeding.  Nipples that are not sore, cracked, or bleeding. Signs that your baby is getting enough milk  Wetting at least 1-2 diapers during the first 24 hours after birth.  Wetting at least 5-6 diapers every 24 hours for the first week after birth. The urine should be clear or pale  yellow by the age of 5 days.  Wetting 6-8 diapers every 24 hours as your baby continues to grow and develop.  At least 3 stools in a 24-hour period by the age of 5 days. The stool should be soft and yellow.  At least 3 stools in a 24-hour period by the age of 7 days. The stool should be seedy and yellow.  No loss of weight greater than 10% of birth weight during the first 3 days of life.  Average weight gain of 4-7 oz (113-198 g) per week after the age of 4 days.  Consistent daily weight gain by the age of 5 days, without weight loss after the age of 2 weeks. After a feeding, your baby may spit up a small amount of milk. This is normal. Breastfeeding frequency and duration Frequent feeding will help you make more milk and can prevent sore nipples and extremely full breasts (breast engorgement). Breastfeed when you feel the need to reduce the fullness of your breasts or when your baby shows signs of hunger. This is called "breastfeeding on demand." Signs that your baby is hungry include:  Increased alertness, activity, or restlessness.  Movement of the head from side to side.  Opening of the mouth when the corner of the mouth or cheek is stroked (rooting).  Increased sucking sounds, smacking lips, cooing, sighing, or squeaking.  Hand-to-mouth movements and sucking on fingers or hands.  Fussing or crying. Avoid introducing a pacifier to your baby in the first 4-6 weeks after your baby is born. After this time, you may choose to use a pacifier. Research has shown that pacifier use during the first year of a baby's life decreases the risk of sudden infant death syndrome (SIDS). Allow your baby to feed on each breast as long as he or she wants. When your baby unlatches or falls asleep while feeding from the first breast, offer the second breast. Because newborns are often sleepy in the first few weeks of life, you may need to awaken your baby to get him or her to feed. Breastfeeding times  will vary from baby to baby. However, the following rules can serve as a guide to help you make sure that your baby is properly fed:  Newborns (babies 49 weeks of age or younger) may breastfeed every 1-3 hours.  Newborns should not go without breastfeeding for longer than 3 hours during the day or 5 hours during the night.  You should breastfeed your baby a minimum of 8 times in a 24-hour period. Breast milk pumping Pumping and storing breast milk allows you to make sure that your baby is exclusively fed your breast milk, even at times when you are unable to breastfeed. This is especially important if you go back to work while you are still breastfeeding, or if you are not able to be present during feedings. Your lactation consultant can help you find a method of pumping that works best for you and give you guidelines about how long it is safe to store breast milk.      Caring for your breasts while you breastfeed Nipples can become dry, cracked, and sore while breastfeeding. The following recommendations can help keep your breasts moisturized and healthy:  Avoid using soap on your nipples.  Wear a supportive bra designed especially for nursing. Avoid wearing underwire-style bras or extremely tight bras (sports bras).  Air-dry your nipples for 3-4 minutes after each feeding.  Use only cotton bra pads to absorb leaked breast milk. Leaking of breast milk between feedings is normal.  Use lanolin on your nipples after breastfeeding. Lanolin helps to maintain your skin's normal moisture barrier. Pure lanolin is not harmful (not toxic) to your baby. You may also hand express a few drops of breast milk and gently massage that milk into your nipples and allow the milk to air-dry. In the first few weeks after giving birth, some women experience breast engorgement. Engorgement can make your breasts feel heavy, warm, and tender to the touch. Engorgement peaks within 3-5 days after you give birth. The  following recommendations can help to ease engorgement:  Completely empty your breasts while breastfeeding or pumping. You may want to start by applying warm, moist heat (in the shower or with warm, water-soaked hand towels) just before feeding or pumping. This increases circulation and helps the milk flow. If your baby does not completely empty your breasts while breastfeeding, pump any extra milk after he or she is finished.  Apply ice packs to your breasts immediately after breastfeeding or pumping, unless this is too uncomfortable for you. To do this: ? Put ice in a plastic bag. ? Place a towel between your skin and the bag. ? Leave the ice  on for 20 minutes, 2-3 times a day.  Make sure that your baby is latched on and positioned properly while breastfeeding. If engorgement persists after 48 hours of following these recommendations, contact your health care provider or a Science writer. Overall health care recommendations while breastfeeding  Eat 3 healthy meals and 3 snacks every day. Well-nourished mothers who are breastfeeding need an additional 450-500 calories a day. You can meet this requirement by increasing the amount of a balanced diet that you eat.  Drink enough water to keep your urine pale yellow or clear.  Rest often, relax, and continue to take your prenatal vitamins to prevent fatigue, stress, and low vitamin and mineral levels in your body (nutrient deficiencies).  Do not use any products that contain nicotine or tobacco, such as cigarettes and e-cigarettes. Your baby may be harmed by chemicals from cigarettes that pass into breast milk and exposure to secondhand smoke. If you need help quitting, ask your health care provider.  Avoid alcohol.  Do not use illegal drugs or marijuana.  Talk with your health care provider before taking any medicines. These include over-the-counter and prescription medicines as well as vitamins and herbal supplements. Some medicines that  may be harmful to your baby can pass through breast milk.  It is possible to become pregnant while breastfeeding. If birth control is desired, ask your health care provider about options that will be safe while breastfeeding your baby. Where to find more information: [employer]: www.llli.org Contact a health care provider if:  You feel like you want to stop breastfeeding or have become frustrated with breastfeeding.  Your nipples are cracked or bleeding.  Your breasts are red, tender, or warm.  You have: ? Painful breasts or nipples. ? A swollen area on either breast. ? A fever or chills. ? Nausea or vomiting. ? Drainage other than breast milk from your nipples.  Your breasts do not become full before feedings by the fifth day after you give birth.  You feel sad and depressed.  Your baby is: ? Too sleepy to eat well. ? Having trouble sleeping. ? More than 40 week old and wetting fewer than 6 diapers in a 24-hour period. ? Not gaining weight by 23 days of age.  Your baby has fewer than 3 stools in a 24-hour period.  Your baby's skin or the white parts of his or her eyes become yellow. Get help right away if:  Your baby is overly tired (lethargic) and does not want to wake up and feed.  Your baby develops an unexplained fever. Summary  Breastfeeding offers many health benefits for infant and mothers.  Try to breastfeed your infant when he or she shows early signs of hunger.  Gently tickle or stroke your baby's lips with your finger or nipple to allow the baby to open his or her mouth. Bring the baby to your breast. Make sure that much of the areola is in your baby's mouth. Offer one side and burp the baby before you offer the other side.  Talk with your health care provider or lactation consultant if you have questions or you face problems as you breastfeed. This information is not intended to replace advice given to you by your health care provider. Make  sure you discuss any questions you have with your health care provider. Document Revised: 10/08/2017 Document Reviewed: 08/15/2016 Elsevier Patient Education  2021 [employer].

## 2020-10-10 ENCOUNTER — Encounter: Payer: Medicaid Other | Admitting: Obstetrics and Gynecology

## 2020-10-16 ENCOUNTER — Other Ambulatory Visit: Payer: Self-pay

## 2020-10-16 ENCOUNTER — Encounter: Payer: Self-pay | Admitting: Lactation Services

## 2020-10-16 ENCOUNTER — Other Ambulatory Visit: Payer: Self-pay | Admitting: *Deleted

## 2020-10-16 ENCOUNTER — Telehealth: Payer: Self-pay | Admitting: Family Medicine

## 2020-10-16 ENCOUNTER — Ambulatory Visit (INDEPENDENT_AMBULATORY_CARE_PROVIDER_SITE_OTHER): Payer: Medicaid Other | Admitting: Lactation Services

## 2020-10-16 DIAGNOSIS — O09219 Supervision of pregnancy with history of pre-term labor, unspecified trimester: Secondary | ICD-10-CM | POA: Diagnosis not present

## 2020-10-16 DIAGNOSIS — O09899 Supervision of other high risk pregnancies, unspecified trimester: Secondary | ICD-10-CM | POA: Diagnosis not present

## 2020-10-16 DIAGNOSIS — O24410 Gestational diabetes mellitus in pregnancy, diet controlled: Secondary | ICD-10-CM

## 2020-10-16 NOTE — Telephone Encounter (Signed)
Called to schedule Fetal Echo for patient. Was informed that referral needs to be faxed to the office at [phone]. Message to front office to fax records.

## 2020-10-16 NOTE — Patient Outreach (Signed)
Medicaid Managed Care   Nurse Care Manager Note  10/16/2020 Name:  Natasha [name] MRN:  [id number] DOB:  1986/02/21  Natasha [name] is an 35 y.o. year old female who is a primary patient of Patient, No Pcp Per.  [facility] Managed Care Coordination team was consulted for assistance with:    Obstetrics healthcare management needs  Natasha [name] was given information about Medicaid Managed Care Coordination team services today. Natasha [name] agreed to services and verbal consent obtained.  Engaged with patient by telephone for follow up visit in response to provider referral for case management and/or care coordination services.   Assessments/Interventions:  Review of past medical history, allergies, medications, health status, including review of consultants reports, laboratory and other test data, was performed as part of comprehensive evaluation and provision of chronic care management services.  SDOH (Social Determinants of Health) assessments and interventions performed:   Care Plan  No Known Allergies  Medications Reviewed Today    Reviewed by [name], RN (Registered Nurse) on 10/16/20 at 1133  Med List Status: <None>  Medication Order Taking? Sig Documenting Provider Last Dose Status Informant  Accu-Chek Softclix Lancets lancets [id number] Yes Use as instructed [name], MD Taking Active            Med Note [name], [name] A   Wed Sep 12, 2020  3:25 PM)    glucose blood (ACCU-CHEK GUIDE) test strip [id number] Yes Use as instructed [name], MD Taking Active            Med Note [name], [name]   Wed Sep 12, 2020  3:25 PM)    HYDROXYprogesterone caproate [facility]) autoinjector 275 mg [id number]   [name], MD  Active            Med Note [name], [name]   Fri Aug 17, 2020 12:35 PM) Taking per pt  metFORMIN (GLUCOPHAGE) 1000 MG tablet [id number] Yes Take 0.5 tablets (500 mg total) by mouth 2 (two) times daily with a meal. [name], MD Taking Active    Prenatal Vit-Fe Fumarate-FA (PRENATAL VITAMINS) 28-0.8 MG TABS [id number] Yes Take 1 tablet by mouth daily. [name], [name], DO Taking Active           Patient Active Problem List   Diagnosis Date Noted  . Unwanted fertility 09/25/2020  . [redacted] weeks gestation of pregnancy 07/24/2020  . Alpha thalassemia silent carrier 06/15/2020  . AMA (advanced maternal age) multigravida 35+ 05/31/2020  . Supervision of high risk pregnancy, antepartum 05/03/2020  . History of gestational diabetes in prior pregnancy, currently pregnant 05/03/2020  . History of four preterm deliveries, currently pregnant 05/03/2020  . Otorrhea, left 05/02/2020  . Gestational diabetes mellitus (GDM), antepartum 06/03/2017  . History of postpartum depression, currently pregnant 02/21/2011    Conditions to be addressed/monitored per PCP order:  OB health management needs-GDM  Care Plan : Gestational Diabetes Management  Updates made by [name], RN since 10/16/2020 12:00 AM    Problem: Glycemic Management (GDM)   Priority: High  Onset Date: 08/28/2020    Long-Range Goal: Glycemic Management Optimized   Start Date: 08/28/2020  Expected End Date: 01/06/2021  This Visit's Progress: On track  Recent Progress: On track  Priority: High  Note:   CARE PLAN ENTRY Medicaid Managed Care (see longtitudinal plan of care for additional care plan information)  Objective:  Lab Results  Component Value Date   HGBA1C 5.9 (H) 05/03/2020 .  Lab Results  Component Value Date   CREATININE 0.60 05/03/2020   CREATININE 0.57 10/01/2019   CREATININE 0.68 09/30/2019   . Patient reported cbg findings: fasting all normal readings, postprandial 3 high readings(121, 144 and 151)  Current Barriers:  Marland Kitchen Knowledge Deficits related to basic Diabetes pathophysiology and self care/management-Natasha [name] is managing her GDM, diagnosed in the first trimester, at home with diet and exercise. She has not been able to check her BS since  08/18/20 due to running out of lancets. She has not started taking the prescribed metformin, due to concern of side effects. She had an OB appointment today that has been rescheduled to 08/29/20, in which she is planning to attend. Natasha [name] has a history of preterm delivery and is taking Makena injections. She has not attended and is not aware of an appointment for fetal echo.-Update- Natasha [name] has all supplies and medication, she is checking her BS 4 times a day. She has started taking metformin as prescribed and notices improvement in her BS readings. She continues to wait for a fetal echo to be scheduled and reports that a referral has not been received by [facility]. She has an appointment today for 17-P injection and is planning to discuss the need for referral at this appointment.  Case Manager Clinical Goal(s):  Marland Kitchen Over the next 30 days, patient will demonstrate improved adherence to prescribed treatment plan for gestational diabetes self care/management as evidenced by:  . adherence to ADA/ carb modified diet . adherence to prescribed medication regimen . Checking blood sugars 4 times a day(fasting and 2 hrs after each meal) as directed . Attending MFM and OB appointments . Scheduling and attending appointment for fetal echo  Interventions:  . Provided education to patient about basic DM disease process and diabetic diet-resent diabetic diet education . Reviewed medications with patient and discussed importance of medication adherence-discussed improved symptoms while taking metformin . Discussed plans with patient for ongoing care management follow up and provided patient with direct contact information for care management team . Reviewed scheduled/upcoming provider appointments: discussed all of her upcoming appointments with OB provider and MFM . Advised patient, providing education and rationale, to check cbg 4 times daily and record, calling OB provider for findings  outside established parameters. . Review of patient status, including review of consultants reports, relevant laboratory and other test results, and medications completed. [name] with OB provider for scheduling of fetal echo-referral requested . Discussed appropriate snacks and ways to adjust chosen foods to be more appropriate with a diabetic diet . Provided education on fetal kick counts via Mychart  Patient Self Care Activities:  -schedule appointment for fetal echo and attend -attend scheduled appointments - call to cancel if needed - keep a calendar with prescription refill dates - keep a calendar with appointment dates  - take metformin as directed by provider  - call for medicine refill 2 or 3 days before it runs out - use an alarm clock or phone to remind me to take my medicine - check blood sugar at prescribed times - check blood sugar if I feel it is too high or too low - enter blood sugar readings and medication or insulin into daily log - take the blood sugar log to all doctor visits - take the blood sugar meter to all doctor visits  - document intake prior to high readings - adjust meals and drinks to adhere to a diabetic diet  Please see past updates related  to this goal by clicking on the "Past Updates" button in the selected goal      Follow Up:  Patient agrees to Care Plan and Follow-up.  Plan: The Managed Medicaid care management team will reach out to the patient again over the next 14 days.  Date/time of next scheduled RN care management/care coordination outreach: 10/30/20 @ 11:30am  Lurena Joiner RN, [place] RN Care Coordinator

## 2020-10-16 NOTE — Progress Notes (Signed)
Chart reviewed for nurse visit. Agree with plan of care.   [name], MD 10/16/20 5:29 PM

## 2020-10-16 NOTE — Patient Instructions (Signed)
Visit Information  Natasha [name] was given information about Medicaid Managed Care team care coordination services as a part of their [facility] Medicaid benefit. Natasha [name] verbally consented to engagement with the [facility] Managed Care team.   For questions related to your [facility] health plan, please call: [phone]  If you would like to schedule transportation through your [facility] plan, please call the following number at least 2 days in advance of your appointment: [phone]   Call the [facility] at [phone], at any time, 24 hours a day, 7 days a week. If you are in danger or need immediate medical attention call 911.  Natasha [name] - following are the goals we discussed in your visit today:  Goals Addressed            This Visit's Progress   . Make and Keep All Appointments       Timeframe:  Long-Range Goal Priority:  High Start Date:   08/28/20                          Expected End Date:  01/06/21                   Follow Up Date 10/30/20  -schedule appointment for fetal echo and attend -attend scheduled appointments - call to cancel if needed - keep a calendar with prescription refill dates - keep a calendar with appointment dates     Why is this important?    Part of staying healthy is seeing the doctor for follow-up care.   If you forget your appointments, there are some things you can do to stay on track.         . Manage My Medicine       Timeframe:  Long-Range Goal Priority:  High Start Date: 08/28/20                            Expected End Date:  01/06/21                     Follow Up Date 10/30/20   - take metformin as directed by provider  - call for medicine refill 2 or 3 days before it runs out - use an alarm clock or phone to remind me to take my medicine     Why is this important?   . These steps will help you keep on track with your medicines.        . Monitor and Manage My Blood Sugar-Diabetes Type 2        Timeframe:  Long-Range Goal Priority:  High Start Date:  08/28/20                           Expected End Date:  01/06/21                     Follow Up Date  10/30/20   - check blood sugar at prescribed times - check blood sugar if I feel it is too high or too low - enter blood sugar readings and medication or insulin into daily log - take the blood sugar log to all doctor visits - take the blood sugar meter to all doctor visits  - document intake prior to high readings - adjust meals and drinks to adhere to a diabetic diet  Why is this important?    Checking your blood sugar at home helps to keep it from getting very high or very low.   Writing the results in a diary or log helps the doctor know how to care for you.   Your blood sugar log should have the time, date and the results.   Also, write down the amount of insulin or other medicine that you take.   Other information, like what you ate, exercise done and how you were feeling, will also be helpful.            Please see education materials related to fetal kick counts provided by MyChart link.    Fetal Movement Counts  What is a fetal movement count? A fetal movement count is the number of times that you feel your baby move during a certain amount of time. This may also be called a fetal kick count. A fetal movement count is recommended for every pregnant woman. You may be asked to start counting fetal movements as early as week 28 of your pregnancy. Pay attention to when your baby is most active. You may notice your baby's sleep and wake cycles. You may also notice things that make your baby move more. You should do a fetal movement count:  When your baby is normally most active.  At the same time each day. A good time to count movements is while you are resting, after having something to eat and drink. How do I count fetal movements? 1. Find a quiet, comfortable area. Sit, or lie down on your side. 2. Write down  the date, the start time and stop time, and the number of movements that you felt between those two times. Take this information with you to your health care visits. 3. Write down your start time when you feel the first movement. 4. Count kicks, flutters, swishes, rolls, and jabs. You should feel at least 10 movements. 5. You may stop counting after you have felt 10 movements, or if you have been counting for 2 hours. Write down the stop time. 6. If you do not feel 10 movements in 2 hours, contact your health care provider for further instructions. Your health care provider may want to do additional tests to assess your baby's well-being. Contact a health care provider if:  You feel fewer than 10 movements in 2 hours.  Your baby is not moving like he or she usually does.  This information is not intended to replace advice given to you by your health care provider. Make sure you discuss any questions you have with your health care provider. Document Revised: 03/03/2019 Document Reviewed: 03/03/2019 Elsevier Patient Education  2021 [place].   Patient verbalizes understanding of instructions provided today.   Telephone follow up appointment with Managed Medicaid care management team member scheduled for:10/30/20 @ 11:30am  Lurena Joiner RN, [place] RN Care Coordinator   Following is a copy of your plan of care:  Patient Care Plan: Gestational Diabetes Management    Problem Identified: Glycemic Management (GDM)   Priority: High  Onset Date: 08/28/2020    Long-Range Goal: Glycemic Management Optimized   Start Date: 08/28/2020  Expected End Date: 01/06/2021  This Visit's Progress: On track  Recent Progress: On track  Priority: High  Note:   CARE PLAN ENTRY Medicaid Managed Care (see longtitudinal plan of care for additional care plan information)  Objective:  Lab Results  Component Value Date  HGBA1C 5.9 (H) 05/03/2020 .   Lab Results   Component Value Date   CREATININE 0.60 05/03/2020   CREATININE 0.57 10/01/2019   CREATININE 0.68 09/30/2019   . Patient reported cbg findings: fasting all normal readings, postprandial 3 high readings(121, 144 and 151)  Current Barriers:  Marland Kitchen Knowledge Deficits related to basic Diabetes pathophysiology and self care/management-Natasha [name] is managing her GDM, diagnosed in the first trimester, at home with diet and exercise. She has not been able to check her BS since 08/18/20 due to running out of lancets. She has not started taking the prescribed metformin, due to concern of side effects. She had an OB appointment today that has been rescheduled to 08/29/20, in which she is planning to attend. Natasha [name] has a history of preterm delivery and is taking Makena injections. She has not attended and is not aware of an appointment for fetal echo.-Update- Natasha [name] has all supplies and medication, she is checking her BS 4 times a day. She has started taking metformin as prescribed and notices improvement in her BS readings. She continues to wait for a fetal echo to be scheduled and reports that a referral has not been received by [facility]. She has an appointment today for 17-P injection and is planning to discuss the need for referral at this appointment.  Case Manager Clinical Goal(s):  Marland Kitchen Over the next 30 days, patient will demonstrate improved adherence to prescribed treatment plan for gestational diabetes self care/management as evidenced by:  . adherence to ADA/ carb modified diet . adherence to prescribed medication regimen . Checking blood sugars 4 times a day(fasting and 2 hrs after each meal) as directed . Attending MFM and OB appointments . Scheduling and attending appointment for fetal echo  Interventions:  . Provided education to patient about basic DM disease process and diabetic diet-resent diabetic diet education . Reviewed medications with patient and discussed importance  of medication adherence-discussed improved symptoms while taking metformin . Discussed plans with patient for ongoing care management follow up and provided patient with direct contact information for care management team . Reviewed scheduled/upcoming provider appointments: discussed all of her upcoming appointments with OB provider and MFM . Advised patient, providing education and rationale, to check cbg 4 times daily and record, calling OB provider for findings outside established parameters. . Review of patient status, including review of consultants reports, relevant laboratory and other test results, and medications completed. Natasha [name] with OB provider for scheduling of fetal echo-referral requested . Discussed appropriate snacks and ways to adjust chosen foods to be more appropriate with a diabetic diet . Provided education on fetal kick counts via Mychart  Patient Self Care Activities:  -schedule appointment for fetal echo and attend -attend scheduled appointments - call to cancel if needed - keep a calendar with prescription refill dates - keep a calendar with appointment dates  - take metformin as directed by provider  - call for medicine refill 2 or 3 days before it runs out - use an alarm clock or phone to remind me to take my medicine - check blood sugar at prescribed times - check blood sugar if I feel it is too high or too low - enter blood sugar readings and medication or insulin into daily log - take the blood sugar log to all doctor visits - take the blood sugar meter to all doctor visits  - document intake prior to high readings - adjust meals and drinks to adhere to a diabetic  diet  Please see past updates related to this goal by clicking on the "Past Updates" button in the selected goal

## 2020-10-16 NOTE — Progress Notes (Signed)
Natasha [name] here for 17-P  Injection.  Injection administered without complication. Patient will return in one week for next injection.  Called Dr. Gladstone Pih office and scheduled fetal Echo 4/22 at 10:00 am.  Donn Pierini, RN 10/16/2020  3:21 PM

## 2020-10-16 NOTE — Telephone Encounter (Signed)
See message below from [facility] care coordinator.   Ambulatory referral to pediatric cardiology placed, please call patient and help her re-schedule.

## 2020-10-16 NOTE — Telephone Encounter (Signed)
-----   Message from [name], RN sent at 10/16/2020 11:52 AM EDT ----- Regarding: Fetal echo Hi,  I am the HR Managed Medicaid Case Manager working with this patient. This morning she mentioned that she has not had her fetal echo. She had an appointment scheduled, but was unable to keep it because the office [facility]) did not receive the referral. I am not sure if there was a mix up or misunderstanding, however, will you please resend the referral. Thank you!  Lurena Joiner RN, BSN [place]  [employer] manager

## 2020-10-17 ENCOUNTER — Ambulatory Visit: Payer: Medicaid Other

## 2020-10-17 ENCOUNTER — Ambulatory Visit: Payer: Medicaid Other | Attending: Family Medicine | Admitting: *Deleted

## 2020-10-17 ENCOUNTER — Other Ambulatory Visit: Payer: Self-pay | Admitting: Pharmacist

## 2020-10-17 ENCOUNTER — Other Ambulatory Visit: Payer: Self-pay

## 2020-10-17 ENCOUNTER — Encounter: Payer: Self-pay | Admitting: *Deleted

## 2020-10-17 ENCOUNTER — Ambulatory Visit (HOSPITAL_BASED_OUTPATIENT_CLINIC_OR_DEPARTMENT_OTHER): Payer: Medicaid Other | Admitting: *Deleted

## 2020-10-17 DIAGNOSIS — O24415 Gestational diabetes mellitus in pregnancy, controlled by oral hypoglycemic drugs: Secondary | ICD-10-CM | POA: Diagnosis not present

## 2020-10-17 DIAGNOSIS — O0990 Supervision of high risk pregnancy, unspecified, unspecified trimester: Secondary | ICD-10-CM

## 2020-10-17 DIAGNOSIS — E669 Obesity, unspecified: Secondary | ICD-10-CM | POA: Diagnosis not present

## 2020-10-17 DIAGNOSIS — O24410 Gestational diabetes mellitus in pregnancy, diet controlled: Secondary | ICD-10-CM

## 2020-10-17 DIAGNOSIS — O99212 Obesity complicating pregnancy, second trimester: Secondary | ICD-10-CM

## 2020-10-17 DIAGNOSIS — Z3A32 32 weeks gestation of pregnancy: Secondary | ICD-10-CM | POA: Insufficient documentation

## 2020-10-17 DIAGNOSIS — O99213 Obesity complicating pregnancy, third trimester: Secondary | ICD-10-CM

## 2020-10-17 MED ORDER — ACCU-CHEK GUIDE VI STRP: ORAL_STRIP | 0 refills | Status: DC

## 2020-10-17 NOTE — Procedures (Signed)
Natasha [name] 1986/07/22 [redacted]w[redacted]d  Fetus A Non-Stress Test Interpretation for 10/17/20  Indication: Gestational Diabetes medication controlled  Fetal Heart Rate A Mode: External Baseline Rate (A): 130 bpm Variability: Moderate Accelerations: 15 x 15 Decelerations: None Multiple birth?: No  Uterine Activity Mode: Palpation,Toco Contraction Frequency (min): 4-11 Contraction Duration (sec): 40-120 Contraction Quality: Mild Resting Tone Palpated: Relaxed Resting Time: Adequate  Interpretation (Fetal Testing) Nonstress Test Interpretation: Reactive Overall Impression: Reassuring for gestational age Comments: Dr. Annamaria Boots reviewed tracing.

## 2020-10-18 ENCOUNTER — Telehealth: Payer: Self-pay | Admitting: Family Medicine

## 2020-10-18 NOTE — Telephone Encounter (Signed)
verification for Makena injections rx shippment  Please call [facility] [phone] per Vicente Males

## 2020-10-22 NOTE — Telephone Encounter (Signed)
Called [facility] and informed that no additional refill of Makena will be needed. Note: pt has 4 remaining doses in cabinet.

## 2020-10-23 ENCOUNTER — Ambulatory Visit: Payer: Medicaid Other

## 2020-10-23 ENCOUNTER — Encounter: Payer: Medicaid Other | Admitting: Obstetrics and Gynecology

## 2020-10-23 ENCOUNTER — Other Ambulatory Visit: Payer: Self-pay

## 2020-10-23 ENCOUNTER — Encounter (HOSPITAL_COMMUNITY): Payer: Self-pay | Admitting: Obstetrics and Gynecology

## 2020-10-23 ENCOUNTER — Inpatient Hospital Stay (HOSPITAL_COMMUNITY)
Admission: AD | Admit: 2020-10-23 | Discharge: 2020-10-27 | DRG: 796 | Disposition: A | Discharge status: Home Health | Payer: Medicaid Other | Attending: Obstetrics and Gynecology | Admitting: Obstetrics and Gynecology

## 2020-10-23 DIAGNOSIS — Z8659 Personal history of other mental and behavioral disorders: Secondary | ICD-10-CM

## 2020-10-23 DIAGNOSIS — Z3A33 33 weeks gestation of pregnancy: Secondary | ICD-10-CM | POA: Diagnosis not present

## 2020-10-23 DIAGNOSIS — Z302 Encounter for sterilization: Secondary | ICD-10-CM

## 2020-10-23 DIAGNOSIS — Z20822 Contact with and (suspected) exposure to covid-19: Secondary | ICD-10-CM | POA: Diagnosis not present

## 2020-10-23 DIAGNOSIS — O42919 Preterm premature rupture of membranes, unspecified as to length of time between rupture and onset of labor, unspecified trimester: Secondary | ICD-10-CM | POA: Diagnosis not present

## 2020-10-23 DIAGNOSIS — O99891 Other specified diseases and conditions complicating pregnancy: Secondary | ICD-10-CM

## 2020-10-23 DIAGNOSIS — O24420 Gestational diabetes mellitus in childbirth, diet controlled: Secondary | ICD-10-CM | POA: Diagnosis not present

## 2020-10-23 DIAGNOSIS — O09899 Supervision of other high risk pregnancies, unspecified trimester: Secondary | ICD-10-CM

## 2020-10-23 DIAGNOSIS — O24419 Gestational diabetes mellitus in pregnancy, unspecified control: Secondary | ICD-10-CM | POA: Diagnosis present

## 2020-10-23 DIAGNOSIS — O09299 Supervision of pregnancy with other poor reproductive or obstetric history, unspecified trimester: Secondary | ICD-10-CM

## 2020-10-23 DIAGNOSIS — Z8632 Personal history of gestational diabetes: Secondary | ICD-10-CM

## 2020-10-23 DIAGNOSIS — Z3A34 34 weeks gestation of pregnancy: Secondary | ICD-10-CM | POA: Diagnosis not present

## 2020-10-23 DIAGNOSIS — O42113 Preterm premature rupture of membranes, onset of labor more than 24 hours following rupture, third trimester: Secondary | ICD-10-CM | POA: Diagnosis not present

## 2020-10-23 DIAGNOSIS — O99824 Streptococcus B carrier state complicating childbirth: Secondary | ICD-10-CM | POA: Diagnosis not present

## 2020-10-23 DIAGNOSIS — O42913 Preterm premature rupture of membranes, unspecified as to length of time between rupture and onset of labor, third trimester: Principal | ICD-10-CM | POA: Diagnosis present

## 2020-10-23 DIAGNOSIS — D563 Thalassemia minor: Secondary | ICD-10-CM | POA: Diagnosis present

## 2020-10-23 DIAGNOSIS — O0990 Supervision of high risk pregnancy, unspecified, unspecified trimester: Secondary | ICD-10-CM

## 2020-10-23 DIAGNOSIS — O24425 Gestational diabetes mellitus in childbirth, controlled by oral hypoglycemic drugs: Secondary | ICD-10-CM | POA: Diagnosis present

## 2020-10-23 LAB — TYPE AND SCREEN
ABO/RH(D): O POS
Antibody Screen: NEGATIVE

## 2020-10-23 LAB — GLUCOSE, CAPILLARY
Glucose-Capillary: 125 mg/dL — ABNORMAL HIGH (ref 70–99)
Glucose-Capillary: 220 mg/dL — ABNORMAL HIGH (ref 70–99)
Glucose-Capillary: 185 mg/dL — ABNORMAL HIGH (ref 70–99)

## 2020-10-23 LAB — CBC
HCT: 36.3 % (ref 36.0–46.0)
HCT: 33.4 % — ABNORMAL LOW (ref 36.0–46.0)
Hemoglobin: 11.8 g/dL — ABNORMAL LOW (ref 12.0–15.0)
Hemoglobin: 10.9 g/dL — ABNORMAL LOW (ref 12.0–15.0)
MCH: 30.3 pg (ref 26.0–34.0)
MCH: 30.4 pg (ref 26.0–34.0)
MCHC: 32.5 g/dL (ref 30.0–36.0)
MCHC: 32.6 g/dL (ref 30.0–36.0)
MCV: 93.3 fL (ref 80.0–100.0)
MCV: 93 fL (ref 80.0–100.0)
Platelets: 242 10*3/uL (ref 150–400)
Platelets: 221 10*3/uL (ref 150–400)
RBC: 3.89 MIL/uL (ref 3.87–5.11)
RBC: 3.59 MIL/uL — ABNORMAL LOW (ref 3.87–5.11)
RDW: 14.3 % (ref 11.5–15.5)
RDW: 14.1 % (ref 11.5–15.5)
WBC: 8.4 10*3/uL (ref 4.0–10.5)
WBC: 8.2 10*3/uL (ref 4.0–10.5)
nRBC: 0 % (ref 0.0–0.2)
nRBC: 0 % (ref 0.0–0.2)

## 2020-10-23 LAB — RESP PANEL BY RT-PCR (FLU A&B, COVID) ARPGX2
Influenza A by PCR: NEGATIVE
Influenza B by PCR: NEGATIVE
SARS Coronavirus 2 by RT PCR: NEGATIVE

## 2020-10-23 LAB — GROUP B STREP BY PCR: Group B strep by PCR: POSITIVE — AB

## 2020-10-23 LAB — RPR: RPR Ser Ql: NONREACTIVE

## 2020-10-23 LAB — POCT FERN TEST: POCT Fern Test: POSITIVE

## 2020-10-23 MED ORDER — OXYTOCIN-SODIUM CHLORIDE 30-0.9 UT/500ML-% IV SOLN: 2.5000 [IU]/h | INTRAVENOUS | Status: DC

## 2020-10-23 MED ORDER — BETAMETHASONE SOD PHOS & ACET 6 (3-3) MG/ML IJ SUSP
12.0000 mg | INTRAMUSCULAR | Status: AC
Start: 2020-10-23 — End: 2020-10-24
  Administered 2020-10-23 – 2020-10-24 (×2): 12 mg via INTRAMUSCULAR
  Filled 2020-10-23: qty 5

## 2020-10-23 MED ORDER — ACETAMINOPHEN 325 MG PO TABS: 650.0000 mg | ORAL_TABLET | ORAL | Status: DC | PRN

## 2020-10-23 MED ORDER — SODIUM CHLORIDE 0.9 % IV SOLN
5.0000 10*6.[IU] | Freq: Once | INTRAVENOUS | Status: AC
  Administered 2020-10-23: 5 10*6.[IU] via INTRAVENOUS
  Filled 2020-10-23: qty 5

## 2020-10-23 MED ORDER — DOCUSATE SODIUM 100 MG PO CAPS
100.0000 mg | ORAL_CAPSULE | Freq: Every day | ORAL | Status: DC
  Administered 2020-10-23 – 2020-10-24 (×2): 100 mg via ORAL
  Filled 2020-10-23 (×2): qty 1

## 2020-10-23 MED ORDER — PENICILLIN G POT IN DEXTROSE 60000 UNIT/ML IV SOLN
3.0000 10*6.[IU] | INTRAVENOUS | Status: DC
  Administered 2020-10-23: 3 10*6.[IU] via INTRAVENOUS
  Filled 2020-10-23: qty 50

## 2020-10-23 MED ORDER — CALCIUM CARBONATE ANTACID 500 MG PO CHEW
2.0000 | CHEWABLE_TABLET | ORAL | Status: DC | PRN
  Administered 2020-10-24: 400 mg via ORAL
  Filled 2020-10-23: qty 2

## 2020-10-23 MED ORDER — LACTATED RINGERS IV SOLN
500.0000 mL | INTRAVENOUS | Status: DC | PRN
  Administered 2020-10-23: 1000 mL via INTRAVENOUS

## 2020-10-23 MED ORDER — LIDOCAINE HCL (PF) 1 % IJ SOLN: 30.0000 mL | INTRAMUSCULAR | Status: DC | PRN

## 2020-10-23 MED ORDER — OXYTOCIN BOLUS FROM INFUSION: 333.0000 mL | Freq: Once | INTRAVENOUS | Status: DC

## 2020-10-23 MED ORDER — OXYCODONE-ACETAMINOPHEN 5-325 MG PO TABS: 1.0000 | ORAL_TABLET | ORAL | Status: DC | PRN

## 2020-10-23 MED ORDER — FENTANYL CITRATE (PF) 100 MCG/2ML IJ SOLN: 50.0000 ug | INTRAMUSCULAR | Status: DC | PRN

## 2020-10-23 MED ORDER — METFORMIN HCL 500 MG PO TABS
500.0000 mg | ORAL_TABLET | Freq: Two times a day (BID) | ORAL | Status: DC
  Administered 2020-10-23 – 2020-10-24 (×4): 500 mg via ORAL
  Filled 2020-10-23 (×7): qty 1

## 2020-10-23 MED ORDER — OXYCODONE-ACETAMINOPHEN 5-325 MG PO TABS: 2.0000 | ORAL_TABLET | ORAL | Status: DC | PRN

## 2020-10-23 MED ORDER — SODIUM CHLORIDE 0.9 % IV SOLN
2.0000 g | Freq: Four times a day (QID) | INTRAVENOUS | Status: DC
  Administered 2020-10-23 – 2020-10-25 (×7): 2 g via INTRAVENOUS
  Filled 2020-10-23 (×7): qty 2000

## 2020-10-23 MED ORDER — TERBUTALINE SULFATE 1 MG/ML IJ SOLN
0.2500 mg | Freq: Once | INTRAMUSCULAR | Status: AC
  Administered 2020-10-23: 0.25 mg via SUBCUTANEOUS
  Filled 2020-10-23: qty 1

## 2020-10-23 MED ORDER — LACTATED RINGERS IV SOLN
INTRAVENOUS | Status: DC
Start: 2020-10-23 — End: 2020-10-23

## 2020-10-23 MED ORDER — AMOXICILLIN 500 MG PO CAPS: 500.0000 mg | ORAL_CAPSULE | Freq: Three times a day (TID) | ORAL | Status: DC

## 2020-10-23 MED ORDER — PRENATAL MULTIVITAMIN CH
1.0000 | ORAL_TABLET | Freq: Every day | ORAL | Status: DC
  Administered 2020-10-23 – 2020-10-24 (×2): 1 via ORAL
  Filled 2020-10-23 (×2): qty 1

## 2020-10-23 MED ORDER — ONDANSETRON HCL 4 MG/2ML IJ SOLN: 4.0000 mg | Freq: Four times a day (QID) | INTRAMUSCULAR | Status: DC | PRN

## 2020-10-23 MED ORDER — SOD CITRATE-CITRIC ACID 500-334 MG/5ML PO SOLN: 30.0000 mL | ORAL | Status: DC | PRN

## 2020-10-23 MED ORDER — ZOLPIDEM TARTRATE 5 MG PO TABS: 5.0000 mg | ORAL_TABLET | Freq: Every evening | ORAL | Status: DC | PRN

## 2020-10-23 MED ORDER — AZITHROMYCIN 500 MG PO TABS
1000.0000 mg | ORAL_TABLET | Freq: Once | ORAL | Status: AC
  Administered 2020-10-23: 1000 mg via ORAL
  Filled 2020-10-23: qty 2

## 2020-10-23 NOTE — Progress Notes (Signed)
Late entry note. Chaplain saw patient at 11:00 am this morning.  Initial visit with patient and her husband to introduce spiritual care and offer support upon referral from pt's midwife. Chaplain asked guided questions to encourage sharing of feelings. Pt shared that she was anxious, but feeling a lot better now that things appear less urgent. Chaplain offered emotional support and explored hope with patient as she shared about her four previous early deliveries. [place] says her first three deliveries were fine even though they were early. Her children were healthy and did not require a NICU stay, but her last pregnancy ended with delivery at 32 weeks and the baby also had a medical condition, which resulted in a four month hospitalization. Chaplain acknowledged the difficulty of that experience and the anxiety previous traumatic and difficult experiences lend to new ones.  Chaplain offered emotional support. Pt expresses that she is fine now though and appears to be in a less anxious state due to her delivery status being downgraded, as evidenced by her ability to tell jokes and laugh about her desire for a tubal ligation after this delivery and banter with her husband.  Chaplain will continue to follow throughout pt's los.   Please page as further needs arise.  Donald Prose. Elyn Peers, M.Div. [facility] Chaplain Pager [phone] Office [phone]     10/23/20 1100  Clinical Encounter Type  Visited With Patient and family together  Visit Type Initial;Spiritual support;Social support  Referral From Other (Comment)  Spiritual Encounters  Spiritual Needs Emotional  Stress Factors  Patient Stress Factors Loss of control

## 2020-10-23 NOTE — Progress Notes (Signed)
Inpatient Diabetes Program Recommendations  AACE/ADA: New Consensus Statement on Inpatient Glycemic Control (2015)  Target Ranges:  Prepandial:   less than 140 mg/dL      Peak postprandial:   less than 180 mg/dL (1-2 hours)      Critically ill patients:  140 - 180 mg/dL   Lab Results  Component Value Date   GLUCAP 125 (H) 10/23/2020   HGBA1C 5.9 (H) 05/03/2020    Review of Glycemic Control Results for Natasha [name], Natasha [name] (MRN [id number]) as of 10/23/2020 13:21  Ref. Range 10/23/2020 09:24  Glucose-Capillary Latest Ref Range: 70 - 99 mg/dL 125 (H)   Diabetes history:  GDM Outpatient Diabetes medications:  None Current orders for Inpatient glycemic control:  Metformin mg BID BMZ first dose @ 5909 today  Inpatient Diabetes Program Recommendations:     Might consider Novolog 0-14 units TID 2 hours post prandial.  Will continue to follow while inpatient.  Thank you, Reche Dixon, RN, BSN Diabetes Coordinator Inpatient Diabetes Program [phone] (team pager from 8a-5p)

## 2020-10-23 NOTE — Consult Note (Signed)
Neonatology Consultation: Reason: PPROM Requested by: Danise Edge discussed the usual care and management of babies born at 44 weeks, the expected length of stay and criteria for discharge home.  Ms. Ormand has had other preterm births and is familiar with the process.  R.L. Darrelyn Hillock.D.

## 2020-10-23 NOTE — MAU Note (Signed)
..  Natasha [name] is a 35 y.o. at 103w5d here in MAU reporting: 6am she felt a gush of clear fluid. Reports occasional contractions. Reports some fetal movement last night but not any since then.   Pain score: 0/10 Vitals:   10/23/20 0634  BP: 124/69  Pulse: 95  Resp: 18  Temp: 97.7 F (36.5 C)  SpO2: 100%     FHT: 133

## 2020-10-23 NOTE — H&P (Signed)
Natasha [name] is a 35 y.o. [phone] female at [redacted]w[redacted]d presenting for clear PPROM at 6am today. She has a history of preterm deliveries at 7, 58, 77 and 36wks, expressed feelings of anxiety about another NICU stay. Not feeling contractions or cramping, no other physical complaints.  OB History    Gravida  6   Para  4   Term  0   Preterm  4   AB  1   Living  4     SAB  1   IAB  0   Ectopic  0   Multiple  0   Live Births  4          Past Medical History:  Diagnosis Date  . Gestational diabetes   . History of preterm delivery 01/19/2017   3 preterm deliveries Had Makena 2012 with del at 36 wks Agrees to [facility] this time  . Hypertension   . 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 IUD REMOVAL    . LAPAROSCOPY  02/06/2011   Procedure: LAPAROSCOPY OPERATIVE;  Surgeon: Osborne Oman, MD;  Location: [facility];  Service: Gynecology;  Laterality: N/A;  Removal of Mirena IUD from sigmoid serosa, enterolysis, proctoscopy  . PROCTOSCOPY  02/06/2011   Procedure: PROCTOSCOPY;  Surgeon: Adin Hector, MD;  Location: [facility];  Service: General;  Laterality: N/A;   Family History: family history includes Hypertension in her maternal grandfather, maternal grandmother, paternal grandfather, and paternal grandmother. Social History:  reports that she has never smoked. She has never used smokeless tobacco. She reports that she does not drink alcohol and does not use drugs.    Maternal Diabetes: Yes:  Diabetes Type:  Insulin/Medication controlled  (metformin) Genetic Screening: Normal Maternal Ultrasounds/Referrals: Was scheduled for fetal echo, missed appt Fetal Ultrasounds or other Referrals:  Fetal echo (not done) Maternal Substance Abuse:  No Significant Maternal Medications:  None Significant Maternal Lab Results: GBS & blood type pending Other Comments:  None  Review of Systems  Constitutional: Negative for chills, fatigue and fever.   HENT: Negative for congestion and sore throat.   Eyes: Negative for visual disturbance.  Respiratory: Negative for cough and shortness of breath.   Cardiovascular: Negative for chest pain.  Gastrointestinal: Negative for abdominal pain, constipation, nausea and vomiting.  Genitourinary: Negative for dysuria, pelvic pain, urgency and vaginal discharge (grossly ruptured).  Neurological: Negative for dizziness, syncope, light-headedness and headaches.  All other systems reviewed and are negative.  Maternal Medical History:  Reason for admission: Rupture of membranes.  Nausea.  Contractions: q3-29min, pt not feeling them at all, baby having occasional late variables, terbutaline given per MD  Fetal activity: Perceived fetal activity is normal.    Prenatal Complications - Diabetes: gestational. Diabetes is managed by oral agent (monotherapy).   Likely DM  Dilation: 3.5 Effacement (%): 70 Station: Ballotable Exam by:: J. Konstantin Lehnen,CNM Blood pressure (!) 115/59, pulse 92, temperature 97.7 F (36.5 C), temperature source Oral, resp. rate 18, height 5\' 1"  (1.549 m), last menstrual period 03/01/2020, SpO2 98 %. Maternal Exam:  Uterine Assessment: Contraction strength is moderate.  Contraction frequency is regular.   Abdomen: Patient reports no abdominal tenderness. Fetal presentation: vertex Presentation confirmed via u/s  Introitus: Normal vulva. Normal vagina.  Ferning test: positive.  Amniotic fluid character: clear.  Pelvis: adequate for delivery.   Cervix: Cervix evaluated by digital exam.   Dilation: 3.5 Effacement (%): 70 Cervical Position: Anterior,Middle Station: Ballotable Presentation:  Vertex Exam by:: Satira Sark    Fetal Exam Fetal Monitor Review: Mode: ultrasound.   Baseline rate: 130.  Variability: minimal (<5 bpm).   Pattern: variable decelerations and accelerations present.    Fetal State Assessment: Category I - tracings are normal.     Physical  Exam Vitals and nursing note reviewed.  Constitutional:      General: She is not in acute distress.    Appearance: She is not ill-appearing.  HENT:     Head: Normocephalic and atraumatic.     Mouth/Throat:     Mouth: Mucous membranes are moist.  Eyes:     Pupils: Pupils are equal, round, and reactive to light.  Cardiovascular:     Rate and Rhythm: Normal rate.     Pulses: Normal pulses.  Pulmonary:     Effort: Pulmonary effort is normal.  Abdominal:     Palpations: Abdomen is soft.  Genitourinary:    General: Normal vulva.  Musculoskeletal:        General: Normal range of motion.  Skin:    General: Skin is warm and dry.     Capillary Refill: Capillary refill takes less than 2 seconds.  Neurological:     Mental Status: She is alert and oriented to person, place, and time.  Psychiatric:        Mood and Affect: Mood normal.        Behavior: Behavior normal.        Thought Content: Thought content normal.        Judgment: Judgment normal.     Prenatal labs: ABO, Rh: O/Positive/-- (10/07 1022) Antibody: Negative (10/07 1022) Rubella: 4.31 (10/07 1022) RPR: Non Reactive (03/01 1603)  HBsAg: Negative (10/07 1022)  HIV: Non Reactive (03/01 1603)  GBS:  PCR collected and pending  Assessment/Plan: PPROM at [redacted]w[redacted]d  - terbutaline, BMZ and PCN ordered - admit to L&D for expectant management A1GDM - metformin ordered, pt on clear liquid diet Desires BTL, consent signed 09/25/20 - needs MD assessment for uterine size, may need OP BTL  Report called to Dr Rosana Hoes (attending) and Dr Astrid Drafts (fellow). Admit orders placed.    Gabriel Carina 10/23/2020, 7:35 AM

## 2020-10-24 LAB — GLUCOSE, CAPILLARY
Glucose-Capillary: 114 mg/dL — ABNORMAL HIGH (ref 70–99)
Glucose-Capillary: 136 mg/dL — ABNORMAL HIGH (ref 70–99)
Glucose-Capillary: 151 mg/dL — ABNORMAL HIGH (ref 70–99)
Glucose-Capillary: 183 mg/dL — ABNORMAL HIGH (ref 70–99)

## 2020-10-24 LAB — GC/CHLAMYDIA PROBE AMP (~~LOC~~) NOT AT ARMC
Chlamydia: NEGATIVE
Comment: NEGATIVE
Comment: NORMAL
Neisseria Gonorrhea: NEGATIVE

## 2020-10-24 NOTE — Progress Notes (Signed)
FACULTY PRACTICE ANTEPARTUM(COMPREHENSIVE) NOTE  Natasha [name] is a 35 y.o. [id number] with Estimated Date of Delivery: 12/06/20   By  best clinical estimate [redacted]w[redacted]d  who is admitted for PPROM.    Fetal presentation is cephalic. Length of Stay:  1  Days  Date of admission:10/23/2020  Subjective: Resting comfortably this am, no acute complaints overnight Patient reports the fetal movement as active. Patient reports uterine contraction  activity as none. Patient reports  vaginal bleeding as none. Patient describes fluid per vagina as None.  Vitals:  Blood pressure 108/66, pulse 95, temperature 98 F (36.7 C), temperature source Oral, resp. rate 18, height 5\' 1"  (1.549 m), weight 69.9 kg, last menstrual period 03/01/2020, SpO2 99 %. Vitals:   10/24/20 0219 10/24/20 0624 10/24/20 0848 10/24/20 1113  BP: 118/64 124/60 (!) 101/53 108/66  Pulse: 91 91 88 95  Resp: 18 18 18 18   Temp: 98.3 F (36.8 C) 97.8 F (36.6 C) 98.1 F (36.7 C) 98 F (36.7 C)  TempSrc: Oral Oral Oral Oral  SpO2: 99% 99% 99% 99%  Weight:      Height:       Physical Examination:  General appearance - alert, well appearing, and in no distress Mental status - mood appropriate Chest - CTAB Heart - normal rate and regular rhythm Abdomen - gravid, non-tender Extremities - no pedal edema noted Skin - warm and dry   Fetal Monitoring:  Baseline: 120 bpm, Variability: moderate, Accelerations: +15x15 accels, and Decelerations: no decels    reactive  Labs:  Results for orders placed or performed during the hospital encounter of 10/23/20 (from the past 24 hour(s))  CBC   Collection Time: 10/23/20  1:30 PM  Result Value Ref Range   WBC 8.2 4.0 - 10.5 K/uL   RBC 3.59 (L) 3.87 - 5.11 MIL/uL   Hemoglobin 10.9 (L) 12.0 - 15.0 g/dL   HCT 33.4 (L) 36.0 - 46.0 %   MCV 93.0 80.0 - 100.0 fL   MCH 30.4 26.0 - 34.0 pg   MCHC 32.6 30.0 - 36.0 g/dL   RDW 14.1 11.5 - 15.5 %   Platelets 221 150 - 400 K/uL   nRBC 0.0 0.0 - 0.2 %   Glucose, capillary   Collection Time: 10/23/20  2:01 PM  Result Value Ref Range   Glucose-Capillary 220 (H) 70 - 99 mg/dL  Glucose, capillary   Collection Time: 10/23/20  8:08 PM  Result Value Ref Range   Glucose-Capillary 185 (H) 70 - 99 mg/dL  Glucose, capillary   Collection Time: 10/24/20  6:22 AM  Result Value Ref Range   Glucose-Capillary 114 (H) 70 - 99 mg/dL  Glucose, capillary   Collection Time: 10/24/20 11:14 AM  Result Value Ref Range   Glucose-Capillary 136 (H) 70 - 99 mg/dL    Imaging Studies:    No results found.   Medications:  Scheduled . [START ON 10/25/2020] amoxicillin  500 mg Oral TID  . docusate sodium  100 mg Oral Daily  . metFORMIN  500 mg Oral BID WC  . prenatal multivitamin  1 tablet Oral Q1200   I have reviewed the patient's current medications.  ASSESSMENT: [id number] [redacted]w[redacted]d Estimated Date of Delivery: 12/06/20 admitted for PPROM and latent labor  PLAN: 1)PPROM -currently on latency antibiotics -Betamethasone course given -no evidence of infection or labor currenly  2) FWB- Cat. I Continue q shift monitoring GBS positive- plan for PCN in labor  DISP: Discussed expectant management vs delivery at 34wks- pt  desires to proceed with delivery @ 34wks.    Clearnce Sorrel Zakari Bathe 10/24/2020,11:34 AM

## 2020-10-25 ENCOUNTER — Encounter (HOSPITAL_COMMUNITY): Payer: Self-pay | Admitting: Obstetrics and Gynecology

## 2020-10-25 ENCOUNTER — Other Ambulatory Visit: Payer: Self-pay

## 2020-10-25 ENCOUNTER — Inpatient Hospital Stay (HOSPITAL_COMMUNITY): Payer: Medicaid Other | Admitting: Anesthesiology

## 2020-10-25 ENCOUNTER — Encounter (HOSPITAL_COMMUNITY): Payer: Medicaid Other

## 2020-10-25 ENCOUNTER — Encounter (HOSPITAL_COMMUNITY)
Admission: AD | Disposition: A | Discharge status: Home Health | Payer: Self-pay | Source: Home / Self Care | Attending: Obstetrics and Gynecology

## 2020-10-25 DIAGNOSIS — O24420 Gestational diabetes mellitus in childbirth, diet controlled: Secondary | ICD-10-CM | POA: Diagnosis not present

## 2020-10-25 DIAGNOSIS — F32A Depression, unspecified: Secondary | ICD-10-CM | POA: Diagnosis not present

## 2020-10-25 DIAGNOSIS — I10 Essential (primary) hypertension: Secondary | ICD-10-CM | POA: Diagnosis not present

## 2020-10-25 DIAGNOSIS — Z302 Encounter for sterilization: Secondary | ICD-10-CM | POA: Diagnosis not present

## 2020-10-25 DIAGNOSIS — O99824 Streptococcus B carrier state complicating childbirth: Secondary | ICD-10-CM | POA: Diagnosis not present

## 2020-10-25 DIAGNOSIS — O42113 Preterm premature rupture of membranes, onset of labor more than 24 hours following rupture, third trimester: Secondary | ICD-10-CM | POA: Diagnosis not present

## 2020-10-25 DIAGNOSIS — Z3A34 34 weeks gestation of pregnancy: Secondary | ICD-10-CM | POA: Diagnosis not present

## 2020-10-25 HISTORY — PX: TUBAL LIGATION: SHX77

## 2020-10-25 LAB — GLUCOSE, CAPILLARY
Glucose-Capillary: 115 mg/dL — ABNORMAL HIGH (ref 70–99)
Glucose-Capillary: 121 mg/dL — ABNORMAL HIGH (ref 70–99)
Glucose-Capillary: 126 mg/dL — ABNORMAL HIGH (ref 70–99)
Glucose-Capillary: 128 mg/dL — ABNORMAL HIGH (ref 70–99)
Glucose-Capillary: 167 mg/dL — ABNORMAL HIGH (ref 70–99)
Glucose-Capillary: 98 mg/dL (ref 70–99)

## 2020-10-25 SURGERY — LIGATION, FALLOPIAN TUBE, POSTPARTUM
Anesthesia: Spinal | Laterality: Bilateral

## 2020-10-25 MED ORDER — ACETAMINOPHEN 500 MG PO TABS
1000.0000 mg | ORAL_TABLET | Freq: Once | ORAL | Status: AC
  Administered 2020-10-25: 1000 mg via ORAL

## 2020-10-25 MED ORDER — SIMETHICONE 80 MG PO CHEW: 80.0000 mg | CHEWABLE_TABLET | ORAL | Status: DC | PRN

## 2020-10-25 MED ORDER — PRENATAL MULTIVITAMIN CH
1.0000 | ORAL_TABLET | Freq: Every day | ORAL | Status: DC
  Administered 2020-10-26: 1 via ORAL
  Filled 2020-10-25: qty 1

## 2020-10-25 MED ORDER — ONDANSETRON HCL 4 MG/2ML IJ SOLN
INTRAMUSCULAR | Status: AC
  Filled 2020-10-25: qty 2

## 2020-10-25 MED ORDER — KETOROLAC TROMETHAMINE 30 MG/ML IJ SOLN
INTRAMUSCULAR | Status: AC
  Filled 2020-10-25: qty 1

## 2020-10-25 MED ORDER — OXYTOCIN-SODIUM CHLORIDE 30-0.9 UT/500ML-% IV SOLN: 2.5000 [IU]/h | INTRAVENOUS | Status: DC

## 2020-10-25 MED ORDER — LACTATED RINGERS IV SOLN: INTRAVENOUS | Status: DC | PRN

## 2020-10-25 MED ORDER — ACETAMINOPHEN 10 MG/ML IV SOLN
INTRAVENOUS | Status: AC
  Filled 2020-10-25: qty 100

## 2020-10-25 MED ORDER — ONDANSETRON HCL 4 MG/2ML IJ SOLN
INTRAMUSCULAR | Status: DC | PRN
  Administered 2020-10-25: 4 mg via INTRAVENOUS

## 2020-10-25 MED ORDER — PROPOFOL 10 MG/ML IV BOLUS
INTRAVENOUS | Status: AC
  Filled 2020-10-25: qty 20

## 2020-10-25 MED ORDER — BUPIVACAINE HCL (PF) 0.25 % IJ SOLN
INTRAMUSCULAR | Status: AC
  Filled 2020-10-25: qty 30

## 2020-10-25 MED ORDER — OXYTOCIN 10 UNIT/ML IJ SOLN
INTRAMUSCULAR | Status: AC
  Filled 2020-10-25: qty 1

## 2020-10-25 MED ORDER — OXYCODONE HCL 5 MG PO TABS
5.0000 mg | ORAL_TABLET | Freq: Four times a day (QID) | ORAL | Status: DC | PRN
  Administered 2020-10-25 – 2020-10-27 (×4): 5 mg via ORAL
  Filled 2020-10-25 (×4): qty 1

## 2020-10-25 MED ORDER — PHENYLEPHRINE HCL-NACL 20-0.9 MG/250ML-% IV SOLN
INTRAVENOUS | Status: AC
  Filled 2020-10-25: qty 250

## 2020-10-25 MED ORDER — LACTATED RINGERS IV SOLN: 500.0000 mL | INTRAVENOUS | Status: DC | PRN

## 2020-10-25 MED ORDER — LIDOCAINE 2% (20 MG/ML) 5 ML SYRINGE
INTRAMUSCULAR | Status: AC
  Filled 2020-10-25: qty 5

## 2020-10-25 MED ORDER — ONDANSETRON HCL 4 MG/2ML IJ SOLN: 4.0000 mg | Freq: Four times a day (QID) | INTRAMUSCULAR | Status: DC | PRN

## 2020-10-25 MED ORDER — ACETAMINOPHEN 160 MG/5ML PO SOLN: 1000.0000 mg | Freq: Once | ORAL | Status: AC

## 2020-10-25 MED ORDER — WITCH HAZEL-GLYCERIN EX PADS: 1.0000 "application " | MEDICATED_PAD | CUTANEOUS | Status: DC | PRN

## 2020-10-25 MED ORDER — OXYTOCIN 10 UNIT/ML IJ SOLN: 10.0000 [IU] | Freq: Once | INTRAMUSCULAR | Status: DC

## 2020-10-25 MED ORDER — OXYTOCIN BOLUS FROM INFUSION: 333.0000 mL | Freq: Once | INTRAVENOUS | Status: DC

## 2020-10-25 MED ORDER — OXYTOCIN-SODIUM CHLORIDE 30-0.9 UT/500ML-% IV SOLN
INTRAVENOUS | Status: AC
  Filled 2020-10-25: qty 500

## 2020-10-25 MED ORDER — IBUPROFEN 600 MG PO TABS
600.0000 mg | ORAL_TABLET | Freq: Four times a day (QID) | ORAL | Status: DC
  Administered 2020-10-25 – 2020-10-27 (×6): 600 mg via ORAL
  Filled 2020-10-25 (×6): qty 1

## 2020-10-25 MED ORDER — ONDANSETRON HCL 4 MG PO TABS: 4.0000 mg | ORAL_TABLET | ORAL | Status: DC | PRN

## 2020-10-25 MED ORDER — FENTANYL CITRATE (PF) 100 MCG/2ML IJ SOLN
INTRAMUSCULAR | Status: AC
  Filled 2020-10-25: qty 2

## 2020-10-25 MED ORDER — LIDOCAINE HCL (CARDIAC) PF 100 MG/5ML IV SOSY
PREFILLED_SYRINGE | INTRAVENOUS | Status: DC | PRN
  Administered 2020-10-25: 80 mg via INTRAVENOUS

## 2020-10-25 MED ORDER — BUPIVACAINE HCL (PF) 0.25 % IJ SOLN
INTRAMUSCULAR | Status: DC | PRN
  Administered 2020-10-25: 20 mL

## 2020-10-25 MED ORDER — SUCCINYLCHOLINE CHLORIDE 20 MG/ML IJ SOLN
INTRAMUSCULAR | Status: DC | PRN
  Administered 2020-10-25: 120 mg via INTRAVENOUS

## 2020-10-25 MED ORDER — COCONUT OIL OIL
1.0000 | TOPICAL_OIL | Status: DC | PRN
Start: 2020-10-25 — End: 2020-10-27

## 2020-10-25 MED ORDER — OXYCODONE-ACETAMINOPHEN 5-325 MG PO TABS: 1.0000 | ORAL_TABLET | ORAL | Status: DC | PRN

## 2020-10-25 MED ORDER — PROMETHAZINE HCL 25 MG/ML IJ SOLN: 6.2500 mg | INTRAMUSCULAR | Status: DC | PRN

## 2020-10-25 MED ORDER — MIDAZOLAM HCL 2 MG/2ML IJ SOLN
INTRAMUSCULAR | Status: AC
  Filled 2020-10-25: qty 2

## 2020-10-25 MED ORDER — KETOROLAC TROMETHAMINE 30 MG/ML IJ SOLN
INTRAMUSCULAR | Status: DC | PRN
  Administered 2020-10-25: 30 mg via INTRAVENOUS

## 2020-10-25 MED ORDER — ACETAMINOPHEN 325 MG PO TABS: 650.0000 mg | ORAL_TABLET | ORAL | Status: DC | PRN

## 2020-10-25 MED ORDER — ZOLPIDEM TARTRATE 5 MG PO TABS: 5.0000 mg | ORAL_TABLET | Freq: Every evening | ORAL | Status: DC | PRN

## 2020-10-25 MED ORDER — ACETAMINOPHEN 325 MG PO TABS
650.0000 mg | ORAL_TABLET | ORAL | Status: DC | PRN
  Administered 2020-10-26: 650 mg via ORAL
  Filled 2020-10-25: qty 2

## 2020-10-25 MED ORDER — PROPOFOL 10 MG/ML IV BOLUS
INTRAVENOUS | Status: DC | PRN
  Administered 2020-10-25: 200 mg via INTRAVENOUS

## 2020-10-25 MED ORDER — OXYCODONE-ACETAMINOPHEN 5-325 MG PO TABS: 2.0000 | ORAL_TABLET | ORAL | Status: DC | PRN

## 2020-10-25 MED ORDER — LIDOCAINE HCL (PF) 1 % IJ SOLN: 30.0000 mL | INTRAMUSCULAR | Status: DC | PRN

## 2020-10-25 MED ORDER — MIDAZOLAM HCL 2 MG/2ML IJ SOLN
INTRAMUSCULAR | Status: DC | PRN
  Administered 2020-10-25: 2 mg via INTRAVENOUS

## 2020-10-25 MED ORDER — ONDANSETRON HCL 4 MG/2ML IJ SOLN: 4.0000 mg | INTRAMUSCULAR | Status: DC | PRN

## 2020-10-25 MED ORDER — TETANUS-DIPHTH-ACELL PERTUSSIS 5-2.5-18.5 LF-MCG/0.5 IM SUSY: 0.5000 mL | PREFILLED_SYRINGE | Freq: Once | INTRAMUSCULAR | Status: DC

## 2020-10-25 MED ORDER — SOD CITRATE-CITRIC ACID 500-334 MG/5ML PO SOLN
30.0000 mL | Freq: Once | ORAL | Status: AC
  Administered 2020-10-25: 30 mL via ORAL
  Filled 2020-10-25: qty 15

## 2020-10-25 MED ORDER — INSULIN ASPART 100 UNIT/ML ~~LOC~~ SOLN
0.0000 [IU] | SUBCUTANEOUS | Status: DC
  Administered 2020-10-26: 1 [IU] via SUBCUTANEOUS

## 2020-10-25 MED ORDER — ACETAMINOPHEN 500 MG PO TABS
ORAL_TABLET | ORAL | Status: AC
  Filled 2020-10-25: qty 2

## 2020-10-25 MED ORDER — BENZOCAINE-MENTHOL 20-0.5 % EX AERO: 1.0000 "application " | INHALATION_SPRAY | CUTANEOUS | Status: DC | PRN

## 2020-10-25 MED ORDER — LACTATED RINGERS IV SOLN: INTRAVENOUS | Status: DC

## 2020-10-25 MED ORDER — OXYCODONE-ACETAMINOPHEN 5-325 MG PO TABS
1.0000 | ORAL_TABLET | Freq: Once | ORAL | Status: AC
  Administered 2020-10-25: 1 via ORAL
  Filled 2020-10-25: qty 1

## 2020-10-25 MED ORDER — KETOROLAC TROMETHAMINE 30 MG/ML IJ SOLN: 30.0000 mg | Freq: Once | INTRAMUSCULAR | Status: DC

## 2020-10-25 MED ORDER — SUCCINYLCHOLINE CHLORIDE 200 MG/10ML IV SOSY
PREFILLED_SYRINGE | INTRAVENOUS | Status: AC
  Filled 2020-10-25: qty 10

## 2020-10-25 MED ORDER — FENTANYL CITRATE (PF) 100 MCG/2ML IJ SOLN
INTRAMUSCULAR | Status: DC | PRN
  Administered 2020-10-25 (×2): 50 ug via INTRAVENOUS
  Administered 2020-10-25: 25 ug via INTRAVENOUS
  Administered 2020-10-25: 100 ug via INTRAVENOUS

## 2020-10-25 MED ORDER — SENNOSIDES-DOCUSATE SODIUM 8.6-50 MG PO TABS
2.0000 | ORAL_TABLET | Freq: Every day | ORAL | Status: DC
  Administered 2020-10-26 – 2020-10-27 (×2): 2 via ORAL
  Filled 2020-10-25 (×3): qty 2

## 2020-10-25 MED ORDER — SUGAMMADEX SODIUM 200 MG/2ML IV SOLN
INTRAVENOUS | Status: DC | PRN
  Administered 2020-10-25: 200 mg via INTRAVENOUS

## 2020-10-25 MED ORDER — DIBUCAINE (PERIANAL) 1 % EX OINT: 1.0000 "application " | TOPICAL_OINTMENT | CUTANEOUS | Status: DC | PRN

## 2020-10-25 MED ORDER — ROCURONIUM BROMIDE 100 MG/10ML IV SOLN
INTRAVENOUS | Status: DC | PRN
  Administered 2020-10-25: 30 mg via INTRAVENOUS

## 2020-10-25 MED ORDER — SOD CITRATE-CITRIC ACID 500-334 MG/5ML PO SOLN: 30.0000 mL | ORAL | Status: DC | PRN

## 2020-10-25 MED ORDER — DIPHENHYDRAMINE HCL 25 MG PO CAPS: 25.0000 mg | ORAL_CAPSULE | Freq: Four times a day (QID) | ORAL | Status: DC | PRN

## 2020-10-25 MED ORDER — FENTANYL CITRATE (PF) 100 MCG/2ML IJ SOLN
25.0000 ug | INTRAMUSCULAR | Status: DC | PRN
  Administered 2020-10-25: 25 ug via INTRAVENOUS
  Administered 2020-10-25: 50 ug via INTRAVENOUS
  Administered 2020-10-25: 25 ug via INTRAVENOUS
  Administered 2020-10-25: 50 ug via INTRAVENOUS

## 2020-10-25 SURGICAL SUPPLY — 22 items
DRSG OPSITE POSTOP 3X4 (GAUZE/BANDAGES/DRESSINGS) ×2 IMPLANT
DURAPREP 26ML APPLICATOR (WOUND CARE) ×2 IMPLANT
GLOVE BIOGEL PI IND STRL 6.5 (GLOVE) ×1 IMPLANT
GLOVE BIOGEL PI IND STRL 7.0 (GLOVE) ×1 IMPLANT
GLOVE BIOGEL PI INDICATOR 6.5 (GLOVE) ×1
GLOVE BIOGEL PI INDICATOR 7.0 (GLOVE) ×1
GLOVE SURG SS PI 6.0 STRL IVOR (GLOVE) ×2 IMPLANT
GOWN STRL REUS W/TWL LRG LVL3 (GOWN DISPOSABLE) ×4 IMPLANT
NEEDLE HYPO 22GX1.5 SAFETY (NEEDLE) IMPLANT
NS IRRIG 1000ML POUR BTL (IV SOLUTION) ×2 IMPLANT
PACK ABDOMINAL MINOR (CUSTOM PROCEDURE TRAY) ×2 IMPLANT
PROTECTOR NERVE ULNAR (MISCELLANEOUS) ×2 IMPLANT
SPONGE GAUZE 2X2 8PLY STRL LF (GAUZE/BANDAGES/DRESSINGS) ×2 IMPLANT
SPONGE LAP 4X18 RFD (DISPOSABLE) IMPLANT
SUT PLAIN 0 NONE (SUTURE) ×2 IMPLANT
SUT VIC AB 0 CT1 27 (SUTURE) ×4
SUT VIC AB 0 CT1 27XBRD ANBCTR (SUTURE) ×2 IMPLANT
SUT VIC AB 3-0 PS2 18 (SUTURE) ×2 IMPLANT
SYR CONTROL 10ML LL (SYRINGE) IMPLANT
TOWEL OR 17X24 6PK STRL BLUE (TOWEL DISPOSABLE) ×4 IMPLANT
TRAY FOLEY CATH SILVER 14FR (SET/KITS/TRAYS/PACK) ×2 IMPLANT
WATER STERILE IRR 1000ML POUR (IV SOLUTION) ×2 IMPLANT

## 2020-10-25 NOTE — BH Specialist Note (Signed)
Pt did not arrive to video visit and did not answer the phone ; Left HIPPA-compliant message to call back Koya Hunger from [facility] at [phone] (main office) or [phone] (Brandy Zuba's office).  ; left MyChart message for patient.      

## 2020-10-25 NOTE — Progress Notes (Signed)
35 y.o. yo [id number]  with undesired fertility,status post vaginal delivery at 34 weeks, desires permanent sterilization. Risks and benefits of procedure discussed with patient including permanence of method, bleeding, infection, injury to surrounding organs and need for additional procedures. Risk failure of 0.5-1% with increased risk of ectopic gestation if pregnancy occurs was also discussed with patient.

## 2020-10-25 NOTE — Op Note (Signed)
NAME@ 10/23/2020 - 10/25/2020  PREOPERATIVE DIAGNOSIS:  Undesired fertility  POSTOPERATIVE DIAGNOSIS:  Undesired fertility  PROCEDURE:  Postpartum Bilateral salpingectomy  ANESTHESIA:  General  COMPLICATIONS:  None immediate.  ESTIMATED BLOOD LOSS:  Less than 20cc.  FLUIDS: 1400 cc LR.   INDICATIONS: 35 y.o. yo [id number]  with undesired fertility,status post vaginal delivery, desires permanent sterilization. Risks and benefits of procedure discussed with patient including permanence of method, bleeding, infection, injury to surrounding organs and need for additional procedures. Risk failure of 0.5-1% with increased risk of ectopic gestation if pregnancy occurs was also discussed with patient.   FINDINGS:  Normal uterus, tubes, and ovaries.  TECHNIQUE: After informed consent was obtained, the patient was taken to the operating room where anesthesia was induced and found to be adequate. A small transverse, infraumbilical skin incision was made with the scalpel. This incision was carried down to the underlying layer of fascia. The fascia was grasped with Kocher clamps tented up and entered sharply with Mayo scissors. Underlying peritoneum was then identified tented up and entered sharply with Metzenbaum scissors. The fascia was tagged with 0 Vicryl. The patient's left fallopian tube was then identified, brought to the incision, and grasped with a Babcock clamp. The tube was then followed out to the fimbria. The Babcock clamp was then used to grasp the tube approximately 4 cm from the cornual region. A 3 cm segment of the tube incorporating the fimbriated end was gasped with a Kelly clamp,  transected and then ligated with free tie of plain gut suture. Good hemostasis was noted and the tube was returned to the abdomen. The right fallopian tube was then identified to its fimbriated end, ligated, and a 3 cm segment incorporating the fimbriated end was clamped with Kelly clamp, transected and excised in a  similar fashion allowing for bilateral salpingectomy. Excellent hemostasis was noted, and the tube returned to the abdomen. The fascia was re-approximated with 0 Vicryl. The skin was closed in a subcuticular fashion with 3-0 Vicryl. Quarter percent Marcaine solution was then injected at the incision site. The patient tolerated the procedure well. Sponge, lap, and needle count were correct x2. The patient was taken to recovery room in stable condition.

## 2020-10-25 NOTE — Transfer of Care (Signed)
Immediate Anesthesia Transfer of Care Note  Patient: Natasha [name]  Procedure(s) Performed: POST PARTUM TUBAL LIGATION (Bilateral )  Patient Location: PACU  Anesthesia Type:General  Level of Consciousness: awake, alert  and oriented  Airway & Oxygen Therapy: Patient Spontanous Breathing and Patient connected to nasal cannula oxygen  Post-op Assessment: Report given to RN and Post -op Vital signs reviewed and stable  Post vital signs: Reviewed and stable  Last Vitals:  Vitals Value Taken Time  BP 122/75 10/25/20 1246  Temp    Pulse 99 10/25/20 1246  Resp 29 10/25/20 1246  SpO2 100 % 10/25/20 1246  Vitals shown include unvalidated device data.  Last Pain:  Vitals:   10/25/20 0833  TempSrc:   PainSc: 3       Patients Stated Pain Goal: 0 (88/33/74 4514)  Complications: No complications documented.

## 2020-10-25 NOTE — Lactation Note (Signed)
This note was copied from a baby's chart. Lactation Consultation Note  Patient Name: Natasha [name] [id number] Date: 10/25/2020 Reason for consult: Follow-up assessment;Infant < 6lbs;Late-preterm 34-36.6wks;NICU baby Age:37 hours  Visited with mom of 52 hours old LPI < 6 lbs NICU female, she's a P5 and reported (+) breast changes, this is her first time BF. She shared with LC that baby # 4 was born at 72 weeks and was a NICU baby that eventually developed NEC. Mom had a tubal ligation today and now she's feeling better, DEBP has been set up but she hasn't started pumping yet.  LC showed mom how to hand express and she was able to do teach back, she also started pumping during [facility] consultation, praised her for her efforts. She's aware that pumping this early on is mainly for breast stimulation and not to get volume. Noticed that left nipple is inverted, but right one is fully everted and tissue is compressible. Mom still pumping when exiting the room.  Reviewed pumping schedule, benefits of breastmilk for NICU babies and breastmilk storage guidelines. She also had questions about a healthy diet and galactagogues.   Feeding plan:  1. Encouraged mom to start pumping consistently every 2-3 hours, ideally 8 pumping sessions/24 hours 2. Hand expression and breast massage were also encouraged prior pumping  BF brochure, BF resources and NICU booklet were reviewed. No support person in mom's room at the time of [facility] consultation. She reported all questions and concerns were answered, she's aware of [facility] OP services and will call PRN.   Maternal Data Has patient been taught Hand Expression?: Yes Does the patient have breastfeeding experience prior to this delivery?: No How long did the patient breastfeed?: didn't BF her other babies  Feeding Mother's Current Feeding Choice: Breast Milk  LATCH Score                    Lactation Tools Discussed/Used Tools: Pump Breast pump type:  Double-Electric Breast Pump Pump Education: Setup, frequency, and cleaning;Milk Storage Reason for Pumping: LPI < 6 lbs in NICU Pumping frequency: q 3 hours  Interventions Interventions: Breast feeding basics reviewed;Hand express;Breast massage;Breast compression;DEBP  Discharge Pump: DEBP WIC Program: Yes  Consult Status Consult Status: Follow-up Date: 10/26/20 Follow-up type: In-patient    Montrey Buist Francene Boyers 10/25/2020, 9:25 PM

## 2020-10-25 NOTE — Anesthesia Postprocedure Evaluation (Signed)
Anesthesia Post Note  Patient: Natasha [name]  Procedure(s) Performed: POST PARTUM TUBAL LIGATION (Bilateral )     Patient location during evaluation: PACU Anesthesia Type: General Level of consciousness: awake and alert and oriented Pain management: pain level controlled Vital Signs Assessment: post-procedure vital signs reviewed and stable Respiratory status: spontaneous breathing, nonlabored ventilation and respiratory function stable Cardiovascular status: blood pressure returned to baseline Postop Assessment: no apparent nausea or vomiting Anesthetic complications: no   No complications documented.  Last Vitals:  Vitals:   10/25/20 1355 10/25/20 1401  BP:  128/74  Pulse:  84  Resp:  16  Temp: 36.7 C   SpO2:  97%    Last Pain:  Vitals:   10/25/20 1400  TempSrc:   PainSc: 0-No pain   Pain Goal: Patients Stated Pain Goal: 0 (10/25/20 0551)                 Brennan Bailey

## 2020-10-25 NOTE — Anesthesia Preprocedure Evaluation (Addendum)
Anesthesia Evaluation  Patient identified by MRN, date of birth, ID band Patient awake    Reviewed: Allergy & Precautions, NPO status , Patient's Chart, lab work & pertinent test results  History of Anesthesia Complications Negative for: history of anesthetic complications  Airway Mallampati: II  TM Distance: >3 FB Neck ROM: Full    Dental no notable dental hx.    Pulmonary neg pulmonary ROS,    Pulmonary exam normal        Cardiovascular hypertension, Normal cardiovascular exam     Neuro/Psych Depression negative neurological ROS     GI/Hepatic negative GI ROS, Neg liver ROS,   Endo/Other  diabetes, Gestational  Renal/GU negative Renal ROS  negative genitourinary   Musculoskeletal negative musculoskeletal ROS (+)   Abdominal   Peds  Hematology negative hematology ROS (+)   Anesthesia Other Findings Day of surgery medications reviewed with patient.  Reproductive/Obstetrics PPD#0                            Anesthesia Physical Anesthesia Plan  ASA: II  Anesthesia Plan: General   Post-op Pain Management:    Induction: Intravenous  PONV Risk Score and Plan: 4 or greater and Treatment may vary due to age or medical condition, Ondansetron and Dexamethasone  Airway Management Planned: Oral ETT  Additional Equipment: None  Intra-op Plan:   Post-operative Plan: Extubation in OR  Informed Consent: I have reviewed the patients History and Physical, chart, labs and discussed the procedure including the risks, benefits and alternatives for the proposed anesthesia with the patient or authorized representative who has indicated his/her understanding and acceptance.     Dental advisory given  Plan Discussed with: CRNA  Anesthesia Plan Comments: (Discussed spinal vs GETA. Patient prefers GETA for procedure. Daiva Huge, MD)       Anesthesia Quick Evaluation

## 2020-10-25 NOTE — Anesthesia Procedure Notes (Signed)
Procedure Name: Intubation Date/Time: 10/25/2020 11:46 AM Performed by: Bufford Spikes, CRNA Pre-anesthesia Checklist: Patient identified, Emergency Drugs available, Suction available and Patient being monitored Patient Re-evaluated:Patient Re-evaluated prior to induction Oxygen Delivery Method: Circle system utilized Preoxygenation: Pre-oxygenation with 100% oxygen Induction Type: IV induction and Rapid sequence Ventilation: Mask ventilation without difficulty Laryngoscope Size: Glidescope and 3 Grade View: Grade II Tube type: Oral Tube size: 7.0 mm Number of attempts: 2 Airway Equipment and Method: Stylet,  Oral airway and Video-laryngoscopy Placement Confirmation: ETT inserted through vocal cords under direct vision,  positive ETCO2 and breath sounds checked- equal and bilateral Secured at: 21 cm Tube secured with: Tape Dental Injury: Teeth and Oropharynx as per pre-operative assessment

## 2020-10-25 NOTE — Discharge Summary (Signed)
Postpartum Discharge Summary     Patient Name: Natasha [name] DOB: 1985/09/01 MRN: [id number]  Date of admission: 10/23/2020 Delivery date:10/25/2020  Delivering provider: [name]  Date of discharge: 10/27/2020  Admitting diagnosis: Indication for care in labor or delivery [O75.9] Intrauterine pregnancy: [redacted]w[redacted]d    Secondary diagnosis:  Active Problems:   History of postpartum depression, currently pregnant   Gestational diabetes mellitus (GDM), antepartum   Supervision of high risk pregnancy, antepartum   History of gestational diabetes in prior pregnancy, currently pregnant   History of four preterm deliveries, currently pregnant   Alpha thalassemia silent carrier   Indication for care in labor or delivery   Preterm delivery  Additional problems: none    Discharge diagnosis: Preterm Pregnancy Delivered and GDM A2                                              Post partum procedures:postpartum tubal ligation Augmentation: N/A Complications: RXBM>84hours  Hospital course: Onset of Labor With Vaginal Delivery      35y.o. yo [id number] 392w0das admitted in Latent Labor on 10/23/2020 for PPROM. She received latency antibiotics, betamethasone and was admitted to antepartum. She was scheduled for IOL 10/25/20 but when into spontaneous labor and was transferred to L&D complete and pushing and had an uncomplicated delivery.  Membrane Rupture Time/Date: 6:00 AM ,10/23/2020   Delivery Method:Vaginal, Spontaneous  Episiotomy: None  Lacerations:  None  Patient had an uncomplicated postpartum course.  She is ambulating, tolerating a regular diet, passing flatus, and urinating well. Patient is discharged home in stable condition on 10/27/20.  Newborn Data: Birth date:10/25/2020  Birth time:3:42 AM  Gender:Female  Living status:Living  Apgars:8 ,9  Weight:2320 g   Magnesium Sulfate received: No BMZ received: Yes Rhophylac:N/A MMR:N/A T-DaP:Given prenatally Flu:  No Transfusion:No  Physical exam  Vitals:   10/26/20 2012 10/26/20 2222 10/27/20 0600 10/27/20 0817  BP: 110/76 112/61 122/77 127/82  Pulse: 77 89 71 71  Resp: 18 18 18 16   Temp: 98.3 F (36.8 C) 98 F (36.7 C) 98 F (36.7 C) 97.7 F (36.5 C)  TempSrc: Oral Oral Oral Oral  SpO2: 100% 100% 99% 98%  Weight:      Height:       General: alert Lochia: appropriate Uterine Fundus: nttp Incision: Dressing is clean, dry, and intact; pt told to remove later the next day DVT Evaluation: No evidence of DVT seen on physical exam. Labs: Lab Results  Component Value Date   WBC 8.2 10/23/2020   HGB 10.9 (L) 10/23/2020   HCT 33.4 (L) 10/23/2020   MCV 93.0 10/23/2020   PLT 221 10/23/2020   CMP Latest Ref Rng & Units 10/26/2020  Glucose 70 - 99 mg/dL 95  BUN 6 - 20 mg/dL -  Creatinine 0.57 - 1.00 mg/dL -  Sodium 134 - 144 mmol/L -  Potassium 3.5 - 5.2 mmol/L -  Chloride 96 - 106 mmol/L -  CO2 20 - 29 mmol/L -  Calcium 8.7 - 10.2 mg/dL -  Total Protein 6.0 - 8.5 g/dL -  Total Bilirubin 0.0 - 1.2 mg/dL -  Alkaline Phos 44 - 121 IU/L -  AST 0 - 40 IU/L -  ALT 0 - 32 IU/L -   Edinburgh Score: Edinburgh Postnatal Depression Scale Screening Tool 07/08/2017  I have  been able to laugh and see the funny side of things. 0  I have looked forward with enjoyment to things. 0  I have blamed myself unnecessarily when things went wrong. 1  I have been anxious or worried for no good reason. 0  I have felt scared or panicky for no good reason. 0  Things have been getting on top of me. 2  I have been so unhappy that I have had difficulty sleeping. 0  I have felt sad or miserable. 2  I have been so unhappy that I have been crying. 1  The thought of harming myself has occurred to me. 0  Edinburgh Postnatal Depression Scale Total 6     After visit meds:  Allergies as of 10/27/2020   No Known Allergies     Medication List    TAKE these medications   acetaminophen 325 MG tablet Commonly  known as: Tylenol Take 2 tablets (650 mg total) by mouth every 6 (six) hours as needed (for pain scale < 4).   ibuprofen 600 MG tablet Commonly known as: ADVIL Take 1 tablet (600 mg total) by mouth every 6 (six) hours as needed.   oxyCODONE-acetaminophen 5-325 MG tablet Commonly known as: PERCOCET/ROXICET Take 1-2 tablets by mouth every 6 (six) hours as needed.   polyethylene glycol 17 g packet Commonly known as: MIRALAX / GLYCOLAX Take 17 g by mouth daily.   prenatal multivitamin Tabs tablet Take 1 tablet by mouth daily at 12 noon.   simethicone 80 MG chewable tablet Commonly known as: MYLICON Chew 1 tablet (80 mg total) by mouth as needed for flatulence.        Discharge home in stable condition Infant Feeding: Breast Infant Disposition:NICU Discharge instruction: per After Visit Summary and Postpartum booklet. Activity: Advance as tolerated. Pelvic rest for 6 weeks.  Diet: routine diet Future Appointments: Future Appointments  Date Time Provider [place]  10/30/2020 11:30 AM [facility] CCC-MM CARE MANAGER [facility] None  10/30/2020  3:00 PM [facility] NURSE [facility]  11/06/2020  3:00 PM [facility] [place]  11/08/2020  1:15 PM [facility]  12/06/2020  8:15 AM Gabriel Carina CNM [facility]  12/06/2020  8:50 AM [facility] LAB [facility] [place]   Follow up Visit:  [place] [facility] Follow up in 3 day(s).   Specialty: Obstetrics and Gynecology Why: incision check, blood pressure check, blood sugar check Contact information: [place] [address] [phone]             Message sent to [facility] 10/25/20 by Sylvester Harder.   Please schedule this patient for a In person postpartum visit in 6 weeks with the following provider: Any provider. Additional Postpartum F/U:Postpartum Depression checkup and 2 hour GTT (postpartum depression check in  2 weeks) and gtt at 6wk visit High risk pregnancy complicated by: GDM and PPROM Delivery mode:  Vaginal, Spontaneous  Anticipated Birth Control:  BTL done [facility]   10/27/2020 Aletha Halim, MD

## 2020-10-25 NOTE — Lactation Note (Signed)
This note was copied from a baby's chart. Lactation Consultation Note  Patient Name: Natasha [name] CBJSE'G Date: 10/25/2020   Age:37 hours  LC has made 2 attempts to consult with mother. Mom had tubal earlier today and not in room at this attempt. Left NICU book and brochure in pt room. Pump set up in room. Mom in pain and has not pumped yet, per RN. Will attempt again next shift.  Gwynne Edinger, MA IBCLC 10/25/2020, 6:10 PM

## 2020-10-25 NOTE — Progress Notes (Signed)
Patient ID: Natasha [name], female   DOB: Nov 28, 1985, 35 y.o.   MRN: [id number] CTSP by nursing with c/o ut ctx and pelvic pressure. SVE C/C/+1 Pt transferred to L & D for delivery. L & D charge nurse and OB Fellow notified.

## 2020-10-25 NOTE — Lactation Note (Signed)
This note was copied from a baby's chart. Lactation Consultation Note Baby less than hr old at time of consult. Baby transferred to NICU. Spoke w/mom about pumping. Mom in agreement. Mom has a now 37 yr old that was born at 109 weeks gestation so she is familiar w/NICU and pumping.  Mom has [place]. Would like WIC referral for DEBP. LC to fax information. Mom signing form.  Patient Name: Natasha [name] KMMNO'T Date: 10/25/2020 Reason for consult: L&D Initial assessment;NICU baby;Late-preterm 34-36.6wks Age:36 hours  Maternal Data    Feeding    LATCH Score                    Lactation Tools Discussed/Used    Interventions    Discharge Pump: DEBP WIC Program: Yes  Consult Status Consult Status: Follow-up Date: 10/25/20 Follow-up type: In-patient    Theodoro Kalata 10/25/2020, 4:45 AM

## 2020-10-26 ENCOUNTER — Ambulatory Visit: Payer: Medicaid Other

## 2020-10-26 ENCOUNTER — Encounter (HOSPITAL_COMMUNITY): Payer: Self-pay | Admitting: Obstetrics and Gynecology

## 2020-10-26 LAB — GLUCOSE, CAPILLARY
Glucose-Capillary: 90 mg/dL (ref 70–99)
Glucose-Capillary: 83 mg/dL (ref 70–99)
Glucose-Capillary: 124 mg/dL — ABNORMAL HIGH (ref 70–99)
Glucose-Capillary: 182 mg/dL — ABNORMAL HIGH (ref 70–99)

## 2020-10-26 LAB — SURGICAL PATHOLOGY

## 2020-10-26 LAB — GLUCOSE, RANDOM: Glucose, Bld: 95 mg/dL (ref 70–99)

## 2020-10-26 NOTE — Clinical Social Work Maternal (Addendum)
CLINICAL SOCIAL WORK MATERNAL/CHILD NOTE  Patient Details  Name: Natasha [name] MRN: [id number] Date of Birth: 28-Jul-1986  Date:  10/26/2020  Clinical Social Worker Initiating Note:  Laurey Arrow Date/Time: Initiated:  10/26/20/1116     Child's Name:  Adline Mango   Biological Parents:  Mother,Father (FOB is Carleene Mains 07/31/1990 [phone])   Need for Interpreter:  None   Reason for Referral:  Behavioral Health Concerns   Address:  [address] [place] [address]    Phone number:  [phone] (home)     Additional phone number: FOB's number is [phone]  Household Members/Support Persons (HM/SP):   Household Member/Support Person 1,Household Member/Support Person 2,Household Member/Support Person 3   HM/SP Name Relationship DOB or Age  HM/SP -1 Samaira Repsher daughter 11/02/10  HM/SP -2 Ramond Dial [place]les[place] son 01/23/2008  HM/SP -3 Sahir Seagrave son 06/08/2017  HM/SP -4        HM/SP -5        HM/SP -6        HM/SP -7        HM/SP -8          Natural Supports (not living in the home):  Extended Family,Immediate Family,Spouse/significant other,Parent   Professional Supports: None   Employment: Part-time   Type of Work: Paediatric nurse   Education:  9 to 11 years (Per MOB, MOB completed 9th grade.)   Homebound arranged: No  Financial Resources:  [employer]   Other Resources:  Physicist, medical ,[employer]   Cultural/Religious Considerations Which May Impact Care:  none reported  Strengths:  Ability to meet basic needs ,Pediatrician chosen,Home prepared for child    Psychotropic Medications:         Pediatrician:    [place]  Pediatrician List:   [facility] ([address])  [place]      Pediatrician Fax Number:    Risk Factors/Current Problems:  None   Cognitive State:  Able to Concentrate ,Alert ,Insightful ,Goal Oriented  ,Linear Thinking    Mood/Affect:  Interested ,Comfortable ,Happy ,Bright ,Relaxed    CSW Assessment: CSW met with MOB in room 110 to complete an assessment for hx of PPD. When CSW arrived, MOB was resting in bed. Without prompting, MOB communicated that she remembers CSW from her last NICU experience. MOB shared pictures of her older son and provided CSW with an update. MOB was polite, easy to engage, and receptive to meeting with CSW.   MOB shared her birthing experience and shared, "This is my 4th NICU experience. Per MOB, she feels better because, "I was further along in my pregnancy." CSW reviewed NICU visitation and expectations. MOB denied having any questions or concerns. MOB also denied barriers to visiting with infant.   CSW asked about MOB's hx with PPD.  MOB denied having a hx and reported, "I don't think I had PPD I think I was just a little stress because my baby was really sick in the NICU." MOB declined resources and referrals for PPD however, expressed feeling comfortable seeking help if help is needed.   Per MOB, MOB has all essential items to care for infant and MOB feels prepared for infant's discharge.   CSW will continue to offer resources and supports to family while infant remains in NICU.    CSW Plan/Description:  Psychosocial Support and Ongoing Assessment of Needs,Sudden Infant  Death Syndrome (SIDS) Education,Perinatal Mood and Anxiety Disorder (PMADs) Education,Other Patient/Family Education   Laurey Arrow, MSW, LCSW Clinical Social Work [phone]  Dimple Nanas, LCSW 10/26/2020, 11:27 AM

## 2020-10-26 NOTE — Progress Notes (Signed)
Post Partum Day 1/POD#1 s/p pp BTL Subjective: Patient is doing well complaining of lower abdominal cramping. Patient ambulated to the bathroom but requests a wheelchair in the hall way secondary to pain. Patient denies chest pain or shortness of breath  Objective: Blood pressure 112/69, pulse 80, temperature 98.1 F (36.7 C), temperature source Oral, resp. rate 16, height 5\' 1"  (1.549 m), weight 69.9 kg, last menstrual period 03/01/2020, SpO2 97 %, unknown if currently breastfeeding.  Physical Exam:  General: alert, cooperative and no distress Lochia: appropriate Uterine Fundus: firm DVT Evaluation: No evidence of DVT seen on physical exam.  Recent Labs    10/23/20 1330  HGB 10.9*  HCT 33.4*    Assessment/Plan: Encourage ambulation Continue routine postpartum care Plan for discharge tomorrow   LOS: 3 days   Natasha [name] 10/26/2020, 7:38 AM

## 2020-10-27 ENCOUNTER — Other Ambulatory Visit: Payer: Self-pay | Admitting: Student

## 2020-10-27 LAB — GLUCOSE, CAPILLARY: Glucose-Capillary: 89 mg/dL (ref 70–99)

## 2020-10-27 MED ORDER — PRENATAL MULTIVITAMIN CH: 1.0000 | ORAL_TABLET | Freq: Every day | ORAL | 1 refills | Status: DC

## 2020-10-27 MED ORDER — IBUPROFEN 600 MG PO TABS: 600.0000 mg | ORAL_TABLET | Freq: Four times a day (QID) | ORAL | 0 refills | Status: DC | PRN

## 2020-10-27 MED ORDER — OXYCODONE-ACETAMINOPHEN 5-325 MG PO TABS: 1.0000 | ORAL_TABLET | Freq: Four times a day (QID) | ORAL | 0 refills | Status: DC | PRN

## 2020-10-27 MED ORDER — ACETAMINOPHEN 325 MG PO TABS: 650.0000 mg | ORAL_TABLET | Freq: Four times a day (QID) | ORAL | Status: DC | PRN

## 2020-10-27 MED ORDER — POLYETHYLENE GLYCOL 3350 17 G PO PACK: 17.0000 g | PACK | Freq: Every day | ORAL | 0 refills | Status: DC

## 2020-10-27 MED ORDER — SIMETHICONE 80 MG PO CHEW: 80.0000 mg | CHEWABLE_TABLET | ORAL | 0 refills | Status: DC | PRN

## 2020-10-27 MED ORDER — OXYCODONE-ACETAMINOPHEN 5-325 MG PO TABS
1.0000 | ORAL_TABLET | Freq: Four times a day (QID) | ORAL | 0 refills | Status: DC | PRN
Start: 2020-10-27 — End: 2020-10-29

## 2020-10-27 NOTE — Progress Notes (Signed)
Teaching complete 

## 2020-10-27 NOTE — Lactation Note (Signed)
This note was copied from a baby's chart. Lactation Consultation Note  Patient Name: Girl Rachana Malesky STMHD'Q Date: 10/27/2020 Reason for consult: Follow-up assessment;NICU baby;1st time breastfeeding;Late-preterm 34-36.6wks;Maternal endocrine disorder Age:37 hours   LC in to visit with P5 Mom of LPTI in the NICU. Mom was set up with a DEBP yesterday and states she has only pumped one time.   LC provided education while washing pump parts.    Mom states she is to be discharged today.  [facility] loaner program reviewed.  Mom is not sure if she will partake in this, will have her RN call LC prn.  Mom shown the hand pump attachment and demonstrated how it functions.  Mom also aware of DEBP in baby's room in the NICU.    Reviewed importance of breast massage and hand expression along with double pumping 8 times a day.  Mom to ask for [facility] prn as Mom will be thinking about what she plans to do regarding pumping.  Encouraged STS with baby in the NICU.  Mom aware of normal LPT breastfeeding behavior requiring the need to support milk supply with pumping for a few weeks.  Mom aware of [place] assistance in the NICU.  Mom denies further questions.   Lactation Tools Discussed/Used Tools: Pump Breast pump type: Double-Electric Breast Pump Pumping frequency: 1 X Pumped volume: 0 mL    Discharge Discharge Education: Engorgement and breast care  Consult Status Consult Status: Follow-up Date: 10/28/20 Follow-up type: In-patient    Broadus John 10/27/2020, 8:57 AM

## 2020-10-27 NOTE — Discharge Instructions (Signed)
Keep checking your blood sugars 2-3 times a day  Postpartum Care After Vaginal Delivery The following information offers guidance about how to care for yourself from the time you deliver your baby to 6-12 weeks after delivery (postpartum period). If you have problems or questions, contact your health care provider for more specific instructions. Follow these instructions at home: Vaginal bleeding  It is normal to have vaginal bleeding (lochia) after delivery. Wear a sanitary pad for bleeding and discharge. ? During the first week after delivery, the amount and appearance of lochia is often similar to a menstrual period. ? Over the next few weeks, it will gradually decrease to a dry, yellow-brown discharge. ? For most women, lochia stops completely by 4-6 weeks after delivery, but can vary.  Change your sanitary pads frequently. Watch for any changes in your flow, such as: ? A sudden increase in volume. ? A change in color. ? Large blood clots.  If you pass a blood clot from your vagina, save it and call your health care provider. Do not flush blood clots down the toilet before talking with your health care provider.  Do not use tampons or douches until your health care provider approves.  If you are not breastfeeding, your period should return 6-8 weeks after delivery. If you are feeding your baby breast milk only, your period may not return until you stop breastfeeding. Perineal care  Keep the area between the vagina and the anus (perineum) clean and dry. Use medicated pads and pain-relieving sprays and creams as directed.  If you had a surgical cut in the perineum (episiotomy) or a tear, check the area for signs of infection until you are healed. Check for: ? More redness, swelling, or pain. ? Fluid or blood coming from the cut or tear. ? Warmth. ? Pus or a bad smell.  You may be given a squirt bottle to use instead of wiping to clean the perineum area after you use the bathroom.  Pat the area gently to dry it.  To relieve pain caused by an episiotomy, a tear, or swollen veins in the anus (hemorrhoids), take a warm sitz bath 2-3 times a day. In a sitz bath, the warm water should only come up to your hips and cover your buttocks.   Breast care  In the first few days after delivery, your breasts may feel heavy, full, and uncomfortable (breast engorgement). Milk may also leak from your breasts. Ask your health care provider about ways to help relieve the discomfort.  If you are breastfeeding: ? Wear a bra that supports your breasts and fits well. Use breast pads to absorb milk that leaks. ? Keep your nipples clean and dry. Apply creams and ointments as told. ? You may have uterine contractions every time you breastfeed for up to several weeks after delivery. This helps your uterus return to its normal size. ? If you have any problems with breastfeeding, notify your health care provider or lactation consultant.  If you are not breastfeeding: ? Avoid touching your breasts. Do not squeeze out (express) milk. Doing this can make your breasts produce more milk. ? Wear a good-fitting bra and use cold packs to help with swelling. Intimacy and sexuality  Ask your health care provider when you can engage in sexual activity. This may depend upon: ? Your risk of infection. ? How fast you are healing. ? Your comfort and desire to engage in sexual activity.  You are able to get pregnant after delivery,  even if you have not had your period. Talk with your health care provider about methods of birth control (contraception) or family planning if you desire future pregnancies. Medicines  Take over-the-counter and prescription medicines only as told by your health care provider.  Take an over-the-counter stool softener to help ease bowel movements as told by your health care provider.  If you were prescribed an antibiotic medicine, take it as told by your health care provider. Do not  stop taking the antibiotic even if you start to feel better.  Review all previous and current prescriptions to check for possible transfer into breast milk. Activity  Gradually return to your normal activities as told by your health care provider.  Rest as much as possible. Nap while your baby is sleeping. Eating and drinking  Drink enough fluid to keep your urine pale yellow.  To help prevent or relieve constipation, eat high-fiber foods every day.  Choose healthy eating to support breastfeeding or weight loss goals.  Take your prenatal vitamins until your health care provider tells you to stop.   General tips/recommendations  Do not use any products that contain nicotine or tobacco. These products include cigarettes, chewing tobacco, and vaping devices, such as e-cigarettes. If you need help quitting, ask your health care provider.  Do not drink alcohol, especially if you are breastfeeding.  Do not take medications or drugs that are not prescribed to you, especially if you are breastfeeding.  Visit your health care provider for a postpartum checkup within the first 3-6 weeks after delivery.  Complete a comprehensive postpartum visit no later than 12 weeks after delivery.  Keep all follow-up visits for you and your baby. Contact a health care provider if:  You feel unusually sad or worried.  Your breasts become red, painful, or hard.  You have a fever or other signs of an infection.  You have bleeding that is soaking through one pad an hour or you have blood clots.  You have a severe headache that doesn't go away or you have vision changes.  You have nausea and vomiting and are unable to eat or drink anything for 24 hours. Get help right away if:  You have chest pain or difficulty breathing.  You have sudden, severe leg pain.  You faint or have a seizure.  You have thoughts about hurting yourself or your baby. If you ever feel like you may hurt yourself or others,  or have thoughts about taking your own life, get help right away. Go to your nearest emergency department or:  Call your local emergency services (911 in the [place]  The National Suicide Prevention Lifeline at 415-887-0017. This suicide crisis helpline is open 24 hours a day.  Text the Crisis Text Line at 207-048-3874 (in the [place].). Summary  The period of time after you deliver your newborn up to 6-12 weeks after delivery is called the postpartum period.  Keep all follow-up visits for you and your baby.  Review all previous and current prescriptions to check for possible transfer into breast milk.  Contact a health care provider if you feel unusually sad or worried during the postpartum period. This information is not intended to replace advice given to you by your health care provider. Make sure you discuss any questions you have with your health care provider. Document Revised: 03/29/2020 Document Reviewed: 03/29/2020 Elsevier Patient Education  2021 [place].   Postpartum Tubal Ligation, Care After The following information offers guidance on how to care for  yourself after your procedure. Your health care provider may also give you more specific instructions. If you have problems or questions, contact your health care provider. What can I expect after the procedure? After the procedure, you may have:  A sore throat if general anesthesia was used.  Bruising or pain in your back.  Nausea or vomiting.  Dizziness.  Mild abdominal discomfort or pain, such as cramping, gas pain, or feeling bloated.  Soreness around the incision area.  Tiredness.  Pain in your shoulders. This is caused by the gas that was used during the procedure. Follow these instructions at home: Medicines  Ask your health care provider if the medicine prescribed to you: ? Requires you to avoid driving or using heavy machinery. ? Can cause constipation. You may need to take these actions to prevent or  treat constipation:  Drink enough fluid to keep your urine pale yellow.  Take over-the-counter or prescription medicines.  Eat foods that are high in fiber, such as beans, whole grains, and fresh fruits and vegetables.  Limit foods that are high in fat and processed sugars, such as fried or sweet foods.  Do not take aspirin because it can cause bleeding. Activity  Rest for the remainder of the day.  Avoid sitting for a long time without moving. Get up to take short walks every 1-2 hours. This is important to improve blood flow and breathing. Ask for help if you feel weak or unsteady.  Do not have sex, douche, or put a tampon or anything else in your vagina for 6 weeks or as long as told by your health care provider.  Do not lift anything that is heavier than your baby for 2 weeks, or the limit that you are told, until your health care provider says that it is safe.  Return to your normal activities as told by your health care provider. Ask your health care provider what activities are safe for you. Incision care  Follow instructions from your health care provider about how to take care of your incision. Make sure you: ? Wash your hands with soap and water for at least 20 seconds before and after you change your bandage (dressing). If soap and water are not available, use hand sanitizer. ? Change your dressing as told by your health care provider. ? Leave stitches (sutures), skin glue, or adhesive strips in place. These skin closures may need to stay in place for 2 weeks or longer. If adhesive strip edges start to loosen and curl up, you may trim the loose edges. Do not remove adhesive strips completely unless your health care provider tells you to do that.  Check your incision area every day for signs of infection. Check for: ? Redness, swelling, or more pain. ? Fluid or blood. ? Warmth. ? Pus or a bad smell.      Other Instructions  Do not take baths, swim, or use a hot tub  until your health care provider approves. Ask your health care provider if you may take showers. You may only be allowed to take sponge baths.  Keep all follow-up visits. This is important. Contact a health care provider if:  You have signs of infection, such as: ? Redness, swelling, or more pain around your incision. ? Fluid or blood coming from your incision. ? Your incision feels warm to the touch. ? You have pus or a bad smell coming from your incision.  Your pain does not improve after 2-3 days.  The  edges of your incision break open after the sutures have been removed.  You have a rash.  You repeatedly become dizzy or lightheaded.  You have pain and pain medicine is not helping.  You are constipated. Get help right away if:  You have a fever or chills.  You faint.  You have pain in your abdomen that gets worse, and the pain is not relieved by pain medicine.  You have shortness of breath or trouble breathing.  You have chest pain, leg pain, or leg swelling.  You have ongoing nausea or diarrhea. These symptoms may represent a serious problem that is an emergency. Do not wait to see if the symptoms will go away. Get medical help right away. Call your local emergency services (911 in the [place] Do not drive yourself to the hospital. Summary  Mild abdominal discomfort is common after this procedure.  Contact your health care provider if you experience problems or have concerns.  Do not lift anything that is heavier than your baby for 2 weeks, or the limit that you are told, until your health care provider says that it is safe.  Keep all follow-up visits. This is important. This information is not intended to replace advice given to you by your health care provider. Make sure you discuss any questions you have with your health care provider. Document Revised: 03/30/2020 Document Reviewed: 03/30/2020 Elsevier Patient Education  2021 [employer].

## 2020-10-29 ENCOUNTER — Telehealth: Payer: Self-pay | Admitting: General Practice

## 2020-10-29 ENCOUNTER — Telehealth: Payer: Self-pay

## 2020-10-29 NOTE — Telephone Encounter (Signed)
Patient called into front office requesting a call back regarding pain post BTL. Patient states she was discharged home on 4/2 and took the percocet every 6 hours and some ibuprofen but was still having pain. Reports she is out of percocet but is still trying to take the Ibuprofen. She describes pain as sharp stabbing in lower belly, which worsens with bending over- rated 8-9. She reports she recently had a bowel movement for the first time since surgery. Asked patient if she has tried taking gas medication at all and she states no. Advised patient to try taking a couple doses of that to see if it improves pain as well as continuing ibuprofen every 6 hours and resting as much as possible. Discussed she has a incision check appt with [place] tomorrow where we can reevaluate pain or she can always call back this afternoon if she hasn't had improvement or feels things are worsening. Patient verbalized understanding.

## 2020-10-29 NOTE — Telephone Encounter (Signed)
Transition Care Management Follow-up Telephone Call  Date of discharge and from where: 10/27/2020 from [facility] and [place].   How have you been since you were released from the hospital? Pt stated that she is still in pain. Pt stated that she does not have support at home and that the pain is interfering with her ability to care for herself. Pt does have a [facility] appt via phone tomorrow.   Any questions or concerns? No  Items Reviewed:  Did the pt receive and understand the discharge instructions provided? Yes   Medications obtained and verified? Yes   Other? No   Any new allergies since your discharge? No   Dietary orders reviewed? n/a  Do you have support at home? Yes   Functional Questionnaire: (I = Independent and D = Dependent) ADLs: I  Bathing/Dressing- I  Meal Prep- I  Eating- I  Maintaining continence- I  Transferring/Ambulation- I  Managing Meds- I   Follow up appointments reviewed:   [facility] f/u appt confirmed? Yes  Scheduled to see [facility] Nurse on 10/30/2020 @ 03:00pm.  Are transportation arrangements needed? No   If their condition worsens, is the pt aware to call PCP or go to the Emergency Dept.? Yes  Was the patient provided with contact information for the PCP's office or ED? Yes  Was to pt encouraged to call back with questions or concerns? Yes

## 2020-10-30 ENCOUNTER — Other Ambulatory Visit: Payer: Self-pay | Admitting: *Deleted

## 2020-10-30 ENCOUNTER — Ambulatory Visit (INDEPENDENT_AMBULATORY_CARE_PROVIDER_SITE_OTHER): Payer: Medicaid Other

## 2020-10-30 ENCOUNTER — Other Ambulatory Visit: Payer: Self-pay

## 2020-10-30 DIAGNOSIS — Z0130 Encounter for examination of blood pressure without abnormal findings: Secondary | ICD-10-CM

## 2020-10-30 DIAGNOSIS — O0990 Supervision of high risk pregnancy, unspecified, unspecified trimester: Secondary | ICD-10-CM

## 2020-10-30 NOTE — Patient Instructions (Signed)
Visit Information  Ms. Alcorn was given information about Medicaid Managed Care team care coordination services as a part of their [facility] Medicaid benefit. Alonia Dibuono Ballon verbally consented to engagement with the [facility] Managed Care team.   For questions related to your [facility] health plan, please call: [phone]  If you would like to schedule transportation through your [facility] plan, please call the following number at least 2 days in advance of your appointment: [phone]   Call the [place] at [phone], at any time, 24 hours a day, 7 days a week. If you are in danger or need immediate medical attention call 911.  Natasha [name] - following are the goals we discussed in your visit today:  Goals Addressed            This Visit's Progress   . Make and Keep All Appointments       Timeframe:  Long-Range Goal Priority:  High Start Date:   08/28/20                          Expected End Date:  01/06/21                   Follow Up Date 12/17/20  - establish care with a PCP after postpartum visit - attend scheduled appointments - call to cancel if needed - keep a calendar with prescription refill dates - keep a calendar with appointment dates     Why is this important?    Part of staying healthy is seeing the doctor for follow-up care.   If you forget your appointments, there are some things you can do to stay on track.         . Manage My Medicine       Timeframe:  Long-Range Goal Priority:  High Start Date: 08/28/20                            Expected End Date:  01/06/21                     Follow Up Date 12/17/20   - call for medicine refill 2 or 3 days before it runs out - use an alarm clock or phone to remind me to take my medicine     Why is this important?   . These steps will help you keep on track with your medicines.        . Monitor and Manage My Blood Sugar-Diabetes Type 2       Timeframe:  Long-Range  Goal Priority:  High Start Date:  08/28/20                           Expected End Date:  01/06/21                     Follow Up Date  12/17/20   - check blood sugar at prescribed times - check blood sugar if I feel it is too high or too low - enter blood sugar readings and medication or insulin into daily log - take the blood sugar log to all doctor visits - take the blood sugar meter to all doctor visits  - document intake prior to high readings - adjust meals and drinks to adhere to a diabetic diet   Why is this  important?    Checking your blood sugar at home helps to keep it from getting very high or very low.   Writing the results in a diary or log helps the doctor know how to care for you.   Your blood sugar log should have the time, date and the results.   Also, write down the amount of insulin or other medicine that you take.   Other information, like what you ate, exercise done and how you were feeling, will also be helpful.            Please see education materials related to diabetes provided by MyChart link.    Diabetes Mellitus and Nutrition, Adult When you have diabetes, or diabetes mellitus, it is very important to have healthy eating habits because your blood sugar (glucose) levels are greatly affected by what you eat and drink. Eating healthy foods in the right amounts, at about the same times every day, can help you:  Control your blood glucose.  Lower your risk of heart disease.  Improve your blood pressure.  Reach or maintain a healthy weight. What can affect my meal plan? Every person with diabetes is different, and each person has different needs for a meal plan. Your health care provider may recommend that you work with a dietitian to make a meal plan that is best for you. Your meal plan may vary depending on factors such as:  The calories you need.  The medicines you take.  Your weight.  Your blood glucose, blood pressure, and cholesterol  levels.  Your activity level.  Other health conditions you have, such as heart or kidney disease. How do carbohydrates affect me? Carbohydrates, also called carbs, affect your blood glucose level more than any other type of food. Eating carbs naturally raises the amount of glucose in your blood. Carb counting is a method for keeping track of how many carbs you eat. Counting carbs is important to keep your blood glucose at a healthy level, especially if you use insulin or take certain oral diabetes medicines. It is important to know how many carbs you can safely have in each meal. This is different for every person. Your dietitian can help you calculate how many carbs you should have at each meal and for each snack. How does alcohol affect me? Alcohol can cause a sudden decrease in blood glucose (hypoglycemia), especially if you use insulin or take certain oral diabetes medicines. Hypoglycemia can be a life-threatening condition. Symptoms of hypoglycemia, such as sleepiness, dizziness, and confusion, are similar to symptoms of having too much alcohol.  Do not drink alcohol if: ? Your health care provider tells you not to drink. ? You are pregnant, may be pregnant, or are planning to become pregnant.  If you drink alcohol: ? Do not drink on an empty stomach. ? Limit how much you use to:  0-1 drink a day for women.  0-2 drinks a day for men. ? Be aware of how much alcohol is in your drink. In the [place] one drink equals one 12 oz bottle of beer (355 mL), one 5 oz glass of wine (148 mL), or one 1 oz glass of hard liquor (44 mL). ? Keep yourself hydrated with water, diet soda, or unsweetened iced tea.  Keep in mind that regular soda, juice, and other mixers may contain a lot of sugar and must be counted as carbs. What are tips for following this plan? Reading food labels  Start by checking the serving  size on the "Nutrition Facts" label of packaged foods and drinks. The amount of calories,  carbs, fats, and other nutrients listed on the label is based on one serving of the item. Many items contain more than one serving per package.  Check the total grams (g) of carbs in one serving. You can calculate the number of servings of carbs in one serving by dividing the total carbs by 15. For example, if a food has 30 g of total carbs per serving, it would be equal to 2 servings of carbs.  Check the number of grams (g) of saturated fats and trans fats in one serving. Choose foods that have a low amount or none of these fats.  Check the number of milligrams (mg) of salt (sodium) in one serving. Most people should limit total sodium intake to less than 2,300 mg per day.  Always check the nutrition information of foods labeled as "low-fat" or "nonfat." These foods may be higher in added sugar or refined carbs and should be avoided.  Talk to your dietitian to identify your daily goals for nutrients listed on the label. Shopping  Avoid buying canned, pre-made, or processed foods. These foods tend to be high in fat, sodium, and added sugar.  Shop around the outside edge of the grocery store. This is where you will most often find fresh fruits and vegetables, bulk grains, fresh meats, and fresh dairy. Cooking  Use low-heat cooking methods, such as baking, instead of high-heat cooking methods like deep frying.  Cook using healthy oils, such as olive, canola, or sunflower oil.  Avoid cooking with butter, cream, or high-fat meats. Meal planning  Eat meals and snacks regularly, preferably at the same times every day. Avoid going long periods of time without eating.  Eat foods that are high in fiber, such as fresh fruits, vegetables, beans, and whole grains. Talk with your dietitian about how many servings of carbs you can eat at each meal.  Eat 4-6 oz (112-168 g) of lean protein each day, such as lean meat, chicken, fish, eggs, or tofu. One ounce (oz) of lean protein is equal to: ? 1 oz (28  g) of meat, chicken, or fish. ? 1 egg. ?  cup (62 g) of tofu.  Eat some foods each day that contain healthy fats, such as avocado, nuts, seeds, and fish.   What foods should I eat? Fruits Berries. Apples. Oranges. Peaches. Apricots. Plums. Grapes. Mango. Papaya. Pomegranate. Kiwi. Cherries. Vegetables Lettuce. Spinach. Leafy greens, including kale, chard, collard greens, and mustard greens. Beets. Cauliflower. Cabbage. Broccoli. Carrots. Green beans. Tomatoes. Peppers. Onions. Cucumbers. Brussels sprouts. Grains Whole grains, such as whole-wheat or whole-grain bread, crackers, tortillas, cereal, and pasta. Unsweetened oatmeal. Quinoa. Brown or wild rice. Meats and other proteins Seafood. Poultry without skin. Lean cuts of poultry and beef. Tofu. Nuts. Seeds. Dairy Low-fat or fat-free dairy products such as milk, yogurt, and cheese. The items listed above may not be a complete list of foods and beverages you can eat. Contact a dietitian for more information. What foods should I avoid? Fruits Fruits canned with syrup. Vegetables Canned vegetables. Frozen vegetables with butter or cream sauce. Grains Refined white flour and flour products such as bread, pasta, snack foods, and cereals. Avoid all processed foods. Meats and other proteins Fatty cuts of meat. Poultry with skin. Breaded or fried meats. Processed meat. Avoid saturated fats. Dairy Full-fat yogurt, cheese, or milk. Beverages Sweetened drinks, such as soda or iced tea. The items listed  above may not be a complete list of foods and beverages you should avoid. Contact a dietitian for more information. Questions to ask a health care provider  Do I need to meet with a diabetes educator?  Do I need to meet with a dietitian?  What number can I call if I have questions?  When are the best times to check my blood glucose? Where to find more information:  American Diabetes Association: diabetes.org  Academy of Nutrition and  Dietetics: www.eatright.[employer] of Diabetes and Digestive and Kidney Diseases: [employer].dk  Association of Diabetes Care and Education Specialists: www.diabeteseducator.org Summary  It is important to have healthy eating habits because your blood sugar (glucose) levels are greatly affected by what you eat and drink.  A healthy meal plan will help you control your blood glucose and maintain a healthy lifestyle.  Your health care provider may recommend that you work with a dietitian to make a meal plan that is best for you.  Keep in mind that carbohydrates (carbs) and alcohol have immediate effects on your blood glucose levels. It is important to count carbs and to use alcohol carefully. This information is not intended to replace advice given to you by your health care provider. Make sure you discuss any questions you have with your health care provider. Document Revised: 06/21/2019 Document Reviewed: 06/21/2019 Elsevier Patient Education  2021 [place].   Patient verbalizes understanding of instructions provided today.   Telephone follow up appointment with Managed Medicaid care management team member scheduled for:12/18/20 @ 11:30am  Lurena Joiner RN, BSN [place] RN Care Coordinator   Following is a copy of your plan of care:  Patient Care Plan: Gestational Diabetes Management    Problem Identified: Glycemic Management (GDM)   Priority: High  Onset Date: 08/28/2020    Long-Range Goal: Glycemic Management Optimized   Start Date: 08/28/2020  Expected End Date: 01/06/2021  This Visit's Progress: On track  Recent Progress: On track  Priority: High  Note:   CARE PLAN ENTRY Medicaid Managed Care (see longtitudinal plan of care for additional care plan information)  Objective:  Lab Results  Component Value Date   HGBA1C 5.9 (H) 05/03/2020 .   Lab Results  Component Value Date   CREATININE 0.60 05/03/2020   CREATININE  0.57 10/01/2019   CREATININE 0.68 09/30/2019   . Patient reported cbg findings: fasting all normal readings, postprandial 3 high readings(121, 144 and 151)  Current Barriers:  Marland Kitchen Knowledge Deficits related to basic Diabetes pathophysiology and self care/management-Ms [name] is managing her GDM, diagnosed in the first trimester, at home with diet and exercise. She has not been able to check her BS since 08/18/20 due to running out of lancets. She has not started taking the prescribed metformin, due to concern of side effects. She had an OB appointment today that has been rescheduled to 08/29/20, in which she is planning to attend. Ms [name] has a history of preterm delivery and is taking Makena injections. -Update- Ms. Ventresca delivered a preterm baby girl on 10/25/20. Infant is in NICU and patient reports she is doing well. Natasha [name] is able to visit her baby and has support to care for her other children while she visits the NICU. Natasha [name] has all supplies and is checking her BS 4 times a day. She reports good readings. She is has an appointment today for incision, BP and BS check. Natasha [name] will need to establish care with PCP.  Case Manager Clinical Goal(s):  . patient will demonstrate improved adherence to prescribed treatment plan for gestational diabetes self care/management as evidenced by:  . adherence to ADA/ carb modified diet . adherence to prescribed medication regimen . Checking blood sugars 4 times a day(fasting and 2 hrs after each meal) as directed . Attending scheduled appointments . Scheduling and attending appointment with PCP  Interventions:  . Provided education to patient about basic DM disease process and diabetic diet . Reviewed medications with patient and discussed importance of medication adherence . Discussed plans with patient for ongoing care management follow up and provided patient with direct contact information for care management team . Reviewed scheduled/upcoming  provider appointments . Advised patient, providing education and rationale, to check cbg 4 times daily and record, calling OB provider for findings outside established parameters. . Review of patient status, including review of consultants reports, relevant laboratory and other test results, and medications completed. Marland Kitchen Collaborate with Care Guide to assist with establishing care with PCP   Patient Self Care Activities:  -schedule appointment with new PCP -attend scheduled appointments - call to cancel if needed - keep a calendar with prescription refill dates - keep a calendar with appointment dates  - call for medicine refill 2 or 3 days before it runs out - use an alarm clock or phone to remind me to take my medicine - check blood sugar at prescribed times - check blood sugar if I feel it is too high or too low - enter blood sugar readings and medication or insulin into daily log - take the blood sugar log to all doctor visits - take the blood sugar meter to all doctor visits  - document intake prior to high readings - adjust meals and drinks to adhere to a diabetic diet  Please see past updates related to this goal by clicking on the "Past Updates" button in the selected goal

## 2020-10-30 NOTE — Patient Outreach (Signed)
Medicaid Managed Care   Nurse Care Manager Note  10/30/2020 Name:  Natasha [name] MRN:  [id number] DOB:  06-Jan-1986  Natasha [name] is an 35 y.o. year old female who is a primary patient of Patient, No Pcp Per (Inactive).  The [facility] Managed Care Coordination team was consulted for assistance with:    Obstetrics healthcare management needs  Natasha [name] was given information about Medicaid Managed Care Coordination team services today. Natasha [name] agreed to services and verbal consent obtained.  Engaged with patient by telephone for follow up visit in response to provider referral for case management and/or care coordination services.   Assessments/Interventions:  Review of past medical history, allergies, medications, health status, including review of consultants reports, laboratory and other test data, was performed as part of comprehensive evaluation and provision of chronic care management services.  SDOH (Social Determinants of Health) assessments and interventions performed:   Care Plan  No Known Allergies  Medications Reviewed Today    Reviewed by [name], RN (Registered Nurse) on 10/30/20 at 1155  Med List Status: <None>  Medication Order Taking? Sig Documenting Provider Last Dose Status Informant  acetaminophen (TYLENOL) 325 MG tablet [id number] No Take 2 tablets (650 mg total) by mouth every 6 (six) hours as needed (for pain scale < 4).  Patient not taking: Reported on 10/30/2020   Starr Lake, CNM Not Taking Active   ibuprofen (ADVIL) 600 MG tablet [id number] Yes Take 1 tablet (600 mg total) by mouth every 6 (six) hours as needed. Starr Lake, CNM Taking Active   polyethylene glycol (MIRALAX / GLYCOLAX) 17 g packet [id number] Yes Take 17 g by mouth daily. Starr Lake, CNM Taking Active   Prenatal Vit-Fe Fumarate-FA (PRENATAL MULTIVITAMIN) TABS tablet [id number] Yes Take 1 tablet by mouth daily at 12 noon. Starr Lake,  CNM Taking Active   simethicone (GAS RELIEF) 80 MG chewable tablet [id number] Yes Chew 80 mg by mouth every 6 (six) hours as needed for flatulence. [provider] Taking Active   simethicone (MYLICON) 80 MG chewable tablet [id number] No Chew 1 tablet (80 mg total) by mouth as needed for flatulence.  Patient not taking: Reported on 10/30/2020   Starr Lake, CNM Not Taking Active           Patient Active Problem List   Diagnosis Date Noted  . Preterm delivery 10/25/2020  . Indication for care in labor or delivery 10/23/2020  . Unwanted fertility 09/25/2020  . Alpha thalassemia silent carrier 06/15/2020  . AMA (advanced maternal age) multigravida 35+ 05/31/2020  . Supervision of high risk pregnancy, antepartum 05/03/2020  . History of gestational diabetes in prior pregnancy, currently pregnant 05/03/2020  . History of four preterm deliveries, currently pregnant 05/03/2020  . Otorrhea, left 05/02/2020  . Gestational diabetes mellitus (GDM), antepartum 06/03/2017  . History of postpartum depression, currently pregnant 02/21/2011    Conditions to be addressed/monitored per PCP order:  Obstetrics healthcare management  Care Plan : Gestational Diabetes Management  Updates made by [name], RN since 10/30/2020 12:00 AM    Problem: Glycemic Management (GDM)   Priority: High  Onset Date: 08/28/2020    Long-Range Goal: Glycemic Management Optimized   Start Date: 08/28/2020  Expected End Date: 01/06/2021  This Visit's Progress: On track  Recent Progress: On track  Priority: High  Note:   CARE PLAN ENTRY Medicaid Managed Care (see longtitudinal plan of care for additional care plan information)  Objective:  Lab Results  Component Value Date   HGBA1C 5.9 (H) 05/03/2020 .   Lab Results  Component Value Date   CREATININE 0.60 05/03/2020   CREATININE 0.57 10/01/2019   CREATININE 0.68 09/30/2019   . Patient reported cbg findings: fasting all normal readings,  postprandial 3 high readings(121, 144 and 151)  Current Barriers:  Marland Kitchen Knowledge Deficits related to basic Diabetes pathophysiology and self care/management-Natasha [name] is managing her GDM, diagnosed in the first trimester, at home with diet and exercise. She has not been able to check her BS since 08/18/20 due to running out of lancets. She has not started taking the prescribed metformin, due to concern of side effects. She had an OB appointment today that has been rescheduled to 08/29/20, in which she is planning to attend. Natasha [name] has a history of preterm delivery and is taking Makena injections. -Update- Natasha [name] delivered a preterm baby girl on 10/25/20. Infant is in NICU and patient reports she is doing well. Natasha [name] is able to visit her baby and has support to care for her other children while she visits the NICU. Natasha [name] has all supplies and is checking her BS 4 times a day. She reports good readings. She is has an appointment today for incision, BP and BS check. Natasha [name] will need to establish care with PCP.  Case Manager Clinical Goal(s):  . patient will demonstrate improved adherence to prescribed treatment plan for gestational diabetes self care/management as evidenced by:  . adherence to ADA/ carb modified diet . adherence to prescribed medication regimen . Checking blood sugars 4 times a day(fasting and 2 hrs after each meal) as directed . Attending scheduled appointments . Scheduling and attending appointment with PCP  Interventions:  . Provided education to patient about basic DM disease process and diabetic diet . Reviewed medications with patient and discussed importance of medication adherence . Discussed plans with patient for ongoing care management follow up and provided patient with direct contact information for care management team . Reviewed scheduled/upcoming provider appointments . Advised patient, providing education and rationale, to check cbg 4 times daily and record,  calling OB provider for findings outside established parameters. . Review of patient status, including review of consultants reports, relevant laboratory and other test results, and medications completed. Marland Kitchen Collaborate with Care Guide to assist with establishing care with PCP   Patient Self Care Activities:  -schedule appointment with new PCP -attend scheduled appointments - call to cancel if needed - keep a calendar with prescription refill dates - keep a calendar with appointment dates  - call for medicine refill 2 or 3 days before it runs out - use an alarm clock or phone to remind me to take my medicine - check blood sugar at prescribed times - check blood sugar if I feel it is too high or too low - enter blood sugar readings and medication or insulin into daily log - take the blood sugar log to all doctor visits - take the blood sugar meter to all doctor visits  - document intake prior to high readings - adjust meals and drinks to adhere to a diabetic diet  Please see past updates related to this goal by clicking on the "Past Updates" button in the selected goal      Follow Up:  Patient agrees to Care Plan and Follow-up.  Plan: The Managed Medicaid care management team will reach out to the patient again over the next 45 days.  Date/time of next  scheduled RN care management/care coordination outreach: 12/18/20 @ 11:30am  Lurena Joiner RN, [place] RN Care Coordinator

## 2020-10-30 NOTE — Progress Notes (Signed)
Pt here today for 1 week BP check. Pt had vaginal delivery on 3/31 and had interval BTL.   Honeycomb Dressing removed today from umbilicus. Clean, dry and intact.   BP 138/87   Pt states having pain in middle abd when sleeping and bending over. Pt states taking 600mg  Ibuprofen every 6 hours. Pt states does not resolve pain. Pain rated at 8 with activity. Rates pain as 4 today. Pt states having regular BM and voiding well.    Reviewed with Dr Dione Plover. Pt ok to have more Rx Percocet and advised to continue to rest until healed. Pt advised to continue to take Ibuprofen 600 mg as directed.   Pt wanting return to work note for after PP visit on 12/06/20. Printed letter and sent to [employer].   All questions, concerns addressed with pt.   Colletta Maryland, RN  10/30/20.

## 2020-10-31 ENCOUNTER — Other Ambulatory Visit: Payer: Self-pay | Admitting: Family Medicine

## 2020-10-31 DIAGNOSIS — Z9851 Tubal ligation status: Secondary | ICD-10-CM

## 2020-10-31 MED ORDER — OXYCODONE-ACETAMINOPHEN 5-325 MG PO TABS: 1.0000 | ORAL_TABLET | ORAL | 0 refills | Status: DC | PRN

## 2020-10-31 NOTE — Progress Notes (Signed)
Chart reviewed for nurse visit. Agree with plan of care.   PMP reviewed prior to prescribing.   [name], MD 10/31/20 12:26 PM

## 2020-11-05 ENCOUNTER — Encounter: Payer: Self-pay | Admitting: *Deleted

## 2020-11-06 ENCOUNTER — Ambulatory Visit: Payer: Medicaid Other

## 2020-11-08 ENCOUNTER — Ambulatory Visit: Payer: Medicaid Other | Admitting: Clinical

## 2020-11-08 DIAGNOSIS — Z91199 Patient's noncompliance with other medical treatment and regimen due to unspecified reason: Secondary | ICD-10-CM

## 2020-11-08 DIAGNOSIS — Z5329 Procedure and treatment not carried out because of patient's decision for other reasons: Secondary | ICD-10-CM

## 2020-11-29 ENCOUNTER — Encounter: Payer: Self-pay | Admitting: *Deleted

## 2020-12-06 ENCOUNTER — Other Ambulatory Visit: Payer: Medicaid Other

## 2020-12-06 ENCOUNTER — Other Ambulatory Visit: Payer: Self-pay | Admitting: *Deleted

## 2020-12-06 ENCOUNTER — Ambulatory Visit: Payer: Medicaid Other | Admitting: Certified Nurse Midwife

## 2020-12-06 DIAGNOSIS — O24429 Gestational diabetes mellitus in childbirth, unspecified control: Secondary | ICD-10-CM

## 2020-12-12 ENCOUNTER — Ambulatory Visit: Payer: Medicaid Other | Admitting: Certified Nurse Midwife

## 2020-12-12 ENCOUNTER — Other Ambulatory Visit: Payer: Medicaid Other

## 2020-12-17 ENCOUNTER — Other Ambulatory Visit: Payer: Self-pay | Admitting: *Deleted

## 2020-12-17 ENCOUNTER — Other Ambulatory Visit: Payer: Self-pay

## 2020-12-17 NOTE — Patient Instructions (Signed)
Visit Information  Natasha [name] was given information about Medicaid Managed Care team care coordination services as a part of their [employer] [facility] Medicaid benefit. Natasha [name] Clos verbally consented to engagement with the [facility] Managed Care team.   For questions related to your [facility] health plan, please call: [phone] or go here:[email or web address]  If you would like to schedule transportation through your [facility] plan, please call the following number at least 2 days in advance of your appointment: [phone].  Call the [facility] at [phone], at any time, 24 hours a day, 7 days a week. If you are in danger or need immediate medical attention call 911.  Natasha [name] - following are the goals we discussed in your visit today:  Goals Addressed            This Visit's Progress   . COMPLETED: Make and Keep All Appointments       Timeframe:  Long-Range Goal Priority:  High Start Date:   08/28/20                          Expected End Date:  12/17/20                   Follow Up Date 12/17/20  - establish care with a PCP after postpartum visit - attend scheduled appointments - call to cancel if needed - keep a calendar with prescription refill dates - keep a calendar with appointment dates     Why is this important?    Part of staying healthy is seeing the doctor for follow-up care.   If you forget your appointments, there are some things you can do to stay on track.         . COMPLETED: Manage My Medicine       Timeframe:  Long-Range Goal Priority:  High Start Date: 08/28/20                            Expected End Date:  12/17/20                     Follow Up Date 12/17/20   - call for medicine refill 2 or 3 days before it runs out - use an alarm clock or phone to remind me to take my medicine     Why is this important?   . These steps will help you keep on track with your medicines.        . COMPLETED: Monitor and Manage  My Blood Sugar-Diabetes Type 2       Timeframe:  Long-Range Goal Priority:  High Start Date:  08/28/20                           Expected End Date:  12/17/20                     Follow Up Date  12/17/20   - check blood sugar at prescribed times - check blood sugar if I feel it is too high or too low - enter blood sugar readings and medication or insulin into daily log - take the blood sugar log to all doctor visits - take the blood sugar meter to all doctor visits  - document intake prior to high readings - adjust meals and drinks to adhere to a diabetic diet  Why is this important?    Checking your blood sugar at home helps to keep it from getting very high or very low.   Writing the results in a diary or log helps the doctor know how to care for you.   Your blood sugar log should have the time, date and the results.   Also, write down the amount of insulin or other medicine that you take.   Other information, like what you ate, exercise done and how you were feeling, will also be helpful.            Please see education materials related to Gestational diabetes provided by MyChart link.    Gestational Diabetes Mellitus, Diagnosis Gestational diabetes mellitus is a form of diabetes. It can happen when you are pregnant. The diabetes goes away after you give birth. If you do not get treated for this condition, it may cause problems for you or your baby. What are the causes? This condition is caused by changes in your body when you are pregnant. When these happen:  A part of the body called the pancreas does not make enough insulin.  The body cannot use insulin in the right way. Sugars cannot get into cells in your body. The sugars stay in your blood. This leads to high blood sugar.   What increases the risk?  Being older than age 5 when pregnant.  Having someone with diabetes in your family.  Too much body weight.  Having had this condition in the  past.  Polycystic ovary syndrome.  Being pregnant with more than one baby. What are the signs or symptoms?  Being thirsty often.  Being hungry often.  Needing to pee more often. How is this treated?  Eat a healthy diet.  Get more exercise.  Check your blood sugar often.  Take insulin and other medicines, if needed.  Work with an Cytogeneticist on this condition, if told. Follow these instructions at home: Learn about your diabetes Ask your doctor:  How often should I check my blood sugar? Where do I get the equipment?  What medicines do I need? When should I take them?  Do I need to meet with an educator?  Who can I call if I have questions?  Where can I find a support group? General instructions  Take medicines only as told by your doctor.  Stay at a healthy weight.  Drink enough fluid to keep your pee pale yellow.  Wear an alert bracelet or carry a card that shows you have this condition.  Keep all follow-up visits. Where to find more information  American Diabetes Association (ADA): diabetes.org  Association of Diabetes Care & Education Specialists (ADCES): diabeteseducator.org  Centers for Disease Control and Prevention (CDC): StoreMirror.com.cy  American Pregnancy Association: americanpregnancy.org  U.S. Department of Agriculture MyPlate: http://www.wilson-mendoza.org/ Contact a doctor if:  Your blood sugar is at or above 240 mg/dL (13.3 mmol/L).  Your blood sugar is at or above 200 mg/dL (11.1 mmol/L) and you have ketones in your pee.  You have a fever.  You are sick for 2 days or more and you do not get better.  You have either of these problems for more than 6 hours: ? You vomit every time you eat or drink. ? You have watery poop (diarrhea). Get help right away if:  You cannot think clearly.  You are not breathing well.  You have a lot of ketones in your pee.  Your baby seems to move less than  normal.  Abnormal fluid or blood starts to come out of your  vagina.  You start having contractions before your due date. You may feel your belly tighten.  You have a very bad headache. These symptoms may be an emergency. Get help right away. Call your local emergency services (911 in the [place]  Do not wait to see if the symptoms will go away.  Do not drive yourself to the hospital. Summary  Gestational diabetes is a form of diabetes. It can happen when you are pregnant.  This condition occurs when your body cannot make or use insulin in the right way.  Eat a healthy diet, exercise, and use medicines or insulin as told by your doctor.  Tell your doctor if your blood sugar is high, you have a fever, or you vomit every time you eat or drink.  Get help right away if you cannot think clearly, you are not breathing well, or your baby seems to move less than normal. This information is not intended to replace advice given to you by your health care provider. Make sure you discuss any questions you have with your health care provider. Document Revised: 12/19/2019 Document Reviewed: 12/19/2019 Elsevier Patient Education  2021 Elsevier Inc.   Patient verbalizes understanding of instructions provided today.   No further follow up required: Patient denies any needs at this time  Estanislado Emms RN, BSN [place]  [facility] RN Care Coordinator   Following is a copy of your plan of care:  Patient Care Plan: Gestational Diabetes Management    Problem Identified: Glycemic Management (GDM)   Priority: High  Onset Date: 08/28/2020    Long-Range Goal: Glycemic Management Optimized Completed 12/17/2020  Start Date: 08/28/2020  Expected End Date: 12/17/2020  This Visit's Progress: On track  Recent Progress: On track  Priority: High  Note:   CARE PLAN ENTRY Medicaid Managed Care (see longtitudinal plan of care for additional care plan information)  Objective:  Lab Results  Component Value Date   HGBA1C 5.9 (H) 05/03/2020 .   Lab  Results  Component Value Date   CREATININE 0.60 05/03/2020   CREATININE 0.57 10/01/2019   CREATININE 0.68 09/30/2019   . Patient reported cbg findings: no longer checking her BS  Current Barriers:  Marland Kitchen Knowledge Deficits related to basic Diabetes pathophysiology and self care/management-Natasha [name] is managing her GDM, diagnosed in the first trimester, at home with diet and exercise. She has not been able to check her BS since 08/18/20 due to running out of lancets. She has not started taking the prescribed metformin, due to concern of side effects. She had an OB appointment today that has been rescheduled to 08/29/20, in which she is planning to attend. Natasha [name] has a history of preterm delivery and is taking Makena injections. -Update- Natasha [name] delivered a preterm baby girl on 10/25/20. Infant is was discharged from the NICU on 11/04/20 and doing well. Natasha [name] is aware of her post partum visit on 12/19/20 and that she should arrive to the appointment fasting.   Case Manager Clinical Goal(s):  . patient will demonstrate improved adherence to prescribed treatment plan for gestational diabetes self care/management as evidenced by:  . adherence to ADA/ carb modified diet . adherence to prescribed medication regimen . Checking blood sugars 4 times a day(fasting and 2 hrs after each meal) as directed . Attending scheduled appointments . Scheduling and attending appointment with PCP  Interventions:  . Provided education to patient about basic  DM disease process and diabetic diet . Discussed plans with patient for ongoing care management follow up and provided patient with direct contact information for care management team . Reviewed scheduled/upcoming provider appointments, discussed to arrive fasting . Review of patient status, including review of consultants reports, relevant laboratory and other test results, and medications completed. . Encouraged to establish care with a PCP   Patient Self Care  Activities:  -schedule appointment with new PCP -attend scheduled appointments - call to cancel if needed - keep a calendar with prescription refill dates - keep a calendar with appointment dates  - call for medicine refill 2 or 3 days before it runs out - use an alarm clock or phone to remind me to take my medicine - check blood sugar at prescribed times - check blood sugar if I feel it is too high or too low - enter blood sugar readings and medication or insulin into daily log - take the blood sugar log to all doctor visits - take the blood sugar meter to all doctor visits  - document intake prior to high readings - adjust meals and drinks to adhere to a diabetic diet  Please see past updates related to this goal by clicking on the "Past Updates" button in the selected goal No follow up at this time.

## 2020-12-17 NOTE — Patient Outreach (Signed)
Medicaid Managed Care   Nurse Care Manager Note  12/17/2020 Name:  Natasha [name] MRN:  [id number] DOB:  05-09-1986  Natasha [name] is an 35 y.o. year old female who is a primary patient of Patient, No Pcp Per (Inactive).  The [facility] Managed Care Coordination team was consulted for assistance with:    Obstetrics healthcare management needs  Natasha [name] was given information about Medicaid Managed Care Coordination team services today. Natasha [name] agreed to services and verbal consent obtained.  Engaged with patient by telephone for follow up visit in response to provider referral for case management and/or care coordination services.   Assessments/Interventions:  Review of past medical history, allergies, medications, health status, including review of consultants reports, laboratory and other test data, was performed as part of comprehensive evaluation and provision of chronic care management services.  SDOH (Social Determinants of Health) assessments and interventions performed:   Care Plan  No Known Allergies  Medications Reviewed Today    Reviewed by Natasha Lopes, RN (Registered Nurse) on 10/30/20 at 1542  Med List Status: <None>  Medication Order Taking? Sig Documenting Provider Last Dose Status Informant  ibuprofen (ADVIL) 600 MG tablet [id number] Yes Take 1 tablet (600 mg total) by mouth every 6 (six) hours as needed. Starr Lake, CNM Taking Active   polyethylene glycol (MIRALAX / GLYCOLAX) 17 g packet [id number] Yes Take 17 g by mouth daily. Starr Lake, CNM Taking Active   Prenatal Vit-Fe Fumarate-FA (PRENATAL MULTIVITAMIN) TABS tablet [id number] Yes Take 1 tablet by mouth daily at 12 noon. Starr Lake, CNM Taking Active           Patient Active Problem List   Diagnosis Date Noted  . Preterm delivery 10/25/2020  . Indication for care in labor or delivery 10/23/2020  . Unwanted fertility 09/25/2020  . Alpha thalassemia silent  carrier 06/15/2020  . AMA (advanced maternal age) multigravida 35+ 05/31/2020  . History of gestational diabetes in prior pregnancy, currently pregnant 05/03/2020  . History of four preterm deliveries, currently pregnant 05/03/2020  . Otorrhea, left 05/02/2020  . Gestational diabetes mellitus (GDM), antepartum 06/03/2017  . History of postpartum depression, currently pregnant 02/21/2011    Conditions to be addressed/monitored per PCP order:  Obstetric health management needs  Care Plan : Gestational Diabetes Management  Updates made by Natasha Montane, RN since 12/17/2020 12:00 AM    Problem: Glycemic Management (GDM)   Priority: High  Onset Date: 08/28/2020    Long-Range Goal: Glycemic Management Optimized Completed 12/17/2020  Start Date: 08/28/2020  Expected End Date: 12/17/2020  This Visit's Progress: On track  Recent Progress: On track  Priority: High  Note:   CARE PLAN ENTRY Medicaid Managed Care (see longtitudinal plan of care for additional care plan information)  Objective:  Lab Results  Component Value Date   HGBA1C 5.9 (H) 05/03/2020 .   Lab Results  Component Value Date   CREATININE 0.60 05/03/2020   CREATININE 0.57 10/01/2019   CREATININE 0.68 09/30/2019   . Patient reported cbg findings: no longer checking her BS  Current Barriers:  Marland Kitchen Knowledge Deficits related to basic Diabetes pathophysiology and self care/management-Natasha [name] is managing her GDM, diagnosed in the first trimester, at home with diet and exercise. She has not been able to check her BS since 08/18/20 due to running out of lancets. She has not started taking the prescribed metformin, due to concern of side effects. She had an OB appointment today that has  been rescheduled to 08/29/20, in which she is planning to attend. Natasha [name] has a history of preterm delivery and is taking Makena injections. -Update- Natasha. [name] delivered a preterm baby girl on 10/25/20. Infant is was discharged from the NICU on 11/04/20  and doing well. Natasha. [name] is aware of her post partum visit on 12/19/20 and that she should arrive to the appointment fasting.   Case Manager Clinical Goal(s):  . patient will demonstrate improved adherence to prescribed treatment plan for gestational diabetes self care/management as evidenced by:  . adherence to ADA/ carb modified diet . adherence to prescribed medication regimen . Checking blood sugars 4 times a day(fasting and 2 hrs after each meal) as directed . Attending scheduled appointments . Scheduling and attending appointment with PCP  Interventions:  . Provided education to patient about basic DM disease process and diabetic diet . Discussed plans with patient for ongoing care management follow up and provided patient with direct contact information for care management team . Reviewed scheduled/upcoming provider appointments, discussed to arrive fasting . Review of patient status, including review of consultants reports, relevant laboratory and other test results, and medications completed. . Encouraged to establish care with a PCP   Patient Self Care Activities:  -schedule appointment with new PCP -attend scheduled appointments - call to cancel if needed - keep a calendar with prescription refill dates - keep a calendar with appointment dates  - call for medicine refill 2 or 3 days before it runs out - use an alarm clock or phone to remind me to take my medicine - check blood sugar at prescribed times - check blood sugar if I feel it is too high or too low - enter blood sugar readings and medication or insulin into daily log - take the blood sugar log to all doctor visits - take the blood sugar meter to all doctor visits  - document intake prior to high readings - adjust meals and drinks to adhere to a diabetic diet  Please see past updates related to this goal by clicking on the "Past Updates" button in the selected goal No follow up at this time.       Follow Up:   Patient agrees to Care Plan and Follow-up.  Plan: The patient has been provided with contact information for the Managed Medicaid care management team and has been advised to call with any health related questions or concerns.  Date/time of next scheduled RN care management/care coordination outreach: No follow up at this time  **Natasha Joiner RN, [place] RN Care Coordinator

## 2020-12-19 ENCOUNTER — Other Ambulatory Visit: Payer: Self-pay

## 2020-12-19 ENCOUNTER — Ambulatory Visit (INDEPENDENT_AMBULATORY_CARE_PROVIDER_SITE_OTHER): Payer: Medicaid Other | Admitting: Certified Nurse Midwife

## 2020-12-19 ENCOUNTER — Encounter: Payer: Self-pay | Admitting: Certified Nurse Midwife

## 2020-12-19 ENCOUNTER — Other Ambulatory Visit: Payer: Medicaid Other

## 2020-12-19 DIAGNOSIS — O24429 Gestational diabetes mellitus in childbirth, unspecified control: Secondary | ICD-10-CM

## 2020-12-19 LAB — POCT URINALYSIS DIP (DEVICE)
Bilirubin Urine: NEGATIVE
Glucose, UA: NEGATIVE mg/dL
Ketones, ur: NEGATIVE mg/dL
Leukocytes,Ua: NEGATIVE
Nitrite: NEGATIVE
Protein, ur: 30 mg/dL — AB
Specific Gravity, Urine: 1.02 (ref 1.005–1.030)
Urobilinogen, UA: 0.2 mg/dL (ref 0.0–1.0)
pH: 5.5 (ref 5.0–8.0)

## 2020-12-19 NOTE — Progress Notes (Signed)
Post Partum Visit Note  Natasha [name] is a 35 y.o. [id number] female who presents for a postpartum visit. She is 7 weeks postpartum following a normal spontaneous vaginal delivery.  I have fully reviewed the prenatal and intrapartum course. The delivery was at 34 gestational weeks.  Anesthesia: none. Postpartum course has been normal. Baby is doing well, only spent 10 days in the NICU. Baby is feeding by bottle - Jerlyn Ly Start soothePro , has not been affected by the formula shortage. Bleeding no bleeding. Bowel function is normal. Bladder function is normal. Patient is sexually active. Contraception method is tubal ligation. Postpartum depression screening: negative.  The pregnancy intention screening data noted above was reviewed. Potential methods of contraception were discussed. The patient had Female Sterilization performed on 10/25/20.  Edinburgh Postnatal Depression Scale - 12/19/20 1006      Edinburgh Postnatal Depression Scale:  In the Past 7 Days   I have been able to laugh and see the funny side of things. 0    I have looked forward with enjoyment to things. 0    I have blamed myself unnecessarily when things went wrong. 0    I have been anxious or worried for no good reason. 0    I have felt scared or panicky for no good reason. 0    Things have been getting on top of me. 0    I have been so unhappy that I have had difficulty sleeping. 0    I have felt sad or miserable. 0    I have been so unhappy that I have been crying. 1    The thought of harming myself has occurred to me. 0    Edinburgh Postnatal Depression Scale Total 1          Health Maintenance Due  Topic Date Due  . COVID-19 Vaccine (1) Never done  . URINE MICROALBUMIN  Never done   The following portions of the patient's history were reviewed and updated as appropriate: allergies, current medications, past family history, past medical history, past social history, past surgical history and problem  list.  Review of Systems Pertinent items noted in HPI and remainder of comprehensive ROS otherwise negative.  Objective:  BP 108/76   Pulse 98   Ht 5\' 1"  (1.549 m)   Wt 140 lb 4.8 oz (63.6 kg)   LMP 12/01/2020 (Exact Date)   Breastfeeding No   BMI 26.51 kg/m    General:  alert, cooperative, appears stated age and no distress   Breasts:  normal  Lungs: normal effort  Heart:  regular rate and rhythm  Abdomen: soft, non-tender   Wound well approximated incision  GU exam:  not indicated       Assessment:   Normal postpartum exam.   Plan:   Essential components of care per ACOG recommendations:  1.  Mood and well being: Patient with negative depression screening today. Reviewed local resources for support.  - Patient tobacco use? No.   - hx of drug use? No.    2. Infant care and feeding:  -Patient currently breastmilk feeding? No.  -Social determinants of health (SDOH) reviewed in EPIC. No concerns  3. Sexuality, contraception and birth spacing - Patient does not want a pregnancy in the next year.  Desired family size is 5 children.  - Reviewed forms of contraception in tiered fashion. Patient desired bilateral tubal ligation today.   - Discussed birth spacing of 18 months  4. Sleep and  fatigue -Encouraged family/partner/community support of 4 hrs of uninterrupted sleep to help with mood and fatigue  5. Physical Recovery  - Discussed patients delivery and complications. She describes her labor as mixed. - Patient had a vaginal delivery, baby to NICU for prematurity. Patient had no laceration. Perineal healing reviewed. Patient expressed understanding - Patient has urinary incontinence? No. - Patient is safe to resume physical and sexual activity  6.  Health Maintenance - HM due items addressed Yes - Last pap smear  Diagnosis  Date Value Ref Range Status  02/28/2020   Final   - Negative for intraepithelial lesion or malignancy (NILM)   Pap smear not done at  today's visit.  -Breast Cancer screening indicated? No.   7. Chronic Disease/Pregnancy Condition follow up: Gestational Diabetes  - Screening today - PCP follow up as needed  Gabriel Carina, [place] for [place]

## 2020-12-20 LAB — GLUCOSE TOLERANCE, 2 HOURS
Glucose, 2 hour: 120 mg/dL (ref 65–139)
Glucose, GTT - Fasting: 103 mg/dL — ABNORMAL HIGH (ref 65–99)

## 2020-12-21 DIAGNOSIS — R7301 Impaired fasting glucose: Secondary | ICD-10-CM

## 2020-12-25 ENCOUNTER — Encounter: Payer: Self-pay | Admitting: *Deleted

## 2020-12-25 ENCOUNTER — Encounter: Payer: Self-pay | Admitting: Family Medicine

## 2021-03-07 ENCOUNTER — Other Ambulatory Visit: Payer: Self-pay

## 2021-03-07 ENCOUNTER — Encounter (INDEPENDENT_AMBULATORY_CARE_PROVIDER_SITE_OTHER): Payer: Self-pay | Admitting: Family

## 2021-03-07 ENCOUNTER — Ambulatory Visit (INDEPENDENT_AMBULATORY_CARE_PROVIDER_SITE_OTHER): Payer: Medicaid Other | Admitting: Family

## 2021-03-07 VITALS — BP 109/71 | HR 83 | Temp 97.7°F | Ht 62.0 in | Wt 138.0 lb

## 2021-03-07 DIAGNOSIS — O24419 Gestational diabetes mellitus in pregnancy, unspecified control: Secondary | ICD-10-CM | POA: Diagnosis not present

## 2021-03-07 DIAGNOSIS — Z7689 Persons encountering health services in other specified circumstances: Secondary | ICD-10-CM

## 2021-03-07 LAB — POCT GLYCOSYLATED HEMOGLOBIN (HGB A1C): Hemoglobin A1C: 5.7 % — AB (ref 4.0–5.6)

## 2021-03-07 NOTE — Progress Notes (Signed)
Natasha [name], is a 35 y.o. female  UDJ:[id number]  HYI:[id number]  DOB - Sep 07, 1985  Subjective:  Chief Complaint and HPI: Natasha [name] is a 35 y.o. female who presents to the clinic this afternoon to establish care and also to check her blood sugar since she was diagnosed with gestational diabetes when she was pregnant.  Patient says that she was referred to the clinic in May of this year.  Denies any polyuria, polyphagia, polydipsia.  Has a 5-monthbaby, does not breast-feed her baby.  Otherwise no symptoms.  ED/Hospital notes reviewed.    ROS:   Constitutional:  No f/c, No night sweats, No unexplained weight loss. EENT:  No vision changes, No blurry vision, No hearing changes. No mouth, throat, or ear problems.  Respiratory: No cough, No SOB Cardiac: No CP, no palpitations GI:  No abd pain, No N/V/D. GU: No Urinary s/sx Musculoskeletal: No joint pain Neuro: No headache, no dizziness, no motor weakness.  Skin: No rash Endocrine:  No polydipsia. No polyuria.  Psych: Denies SI/HI  No problems updated.  ALLERGIES: No Known Allergies  PAST MEDICAL HISTORY: Past Medical History:  Diagnosis Date   Gestational diabetes    Gestational diabetes mellitus (GDM), antepartum 06/03/2017   [x]  aspirin 06/03/17   History of postpartum depression, currently pregnant 02/21/2011   History of preterm delivery 01/19/2017   3 preterm deliveries [facility] 2012 with [facility]   Hypertension    Post partum depression 02/21/2011    MEDICATIONS AT HOME: Prior to Admission medications   Not on File     Objective:  EXAM:   Vitals:   03/07/21 1352  BP: 109/71  Pulse: 83  Temp: 97.7 F (36.5 C)  TempSrc: Temporal  SpO2: 93%  Weight: 138 lb (62.6 kg)  Height: 5' 2"  (1.575 m)    General appearance : A&OX3. NAD. Non-toxic-appearing HEENT: Atraumatic and Normocephalic.  PERRLA. EOM intact.  TM clear B. Mouth-MMM, post pharynx WNL w/o erythema, No PND. Neck:  supple, no JVD. No cervical lymphadenopathy. No thyromegaly Chest/Lungs:  Breathing-non-labored, Good air entry bilaterally, breath sounds normal without rales, rhonchi, or wheezing  CVS: S1 S2 regular, no murmurs, gallops, rubs  Abdomen: Bowel sounds present, Non tender and not distended with no gaurding, rigidity or rebound. Extremities: Bilateral Lower Ext shows no edema, both legs are warm to touch with = pulse throughout Neurology:  CN II-XII grossly intact, Non focal.   Psych:  TP linear. J/I WNL. Normal speech. Appropriate eye contact and affect.  Skin:  No Rash  Data Review Lab Results  Component Value Date   HGBA1C 5.7 (A) 03/07/2021   HGBA1C 5.9 (H) 05/03/2020     Assessment & Plan   1. Gestational diabetes mellitus (GDM), antepartum, gestational diabetes method of control unspecified -Increase physical activities -Avoid any simple sugar drinks or fast foods - HgB A1c - CMP14+EGFR - CBC with Differential  2. Encounter to establish care - Vitamin D, 25-hydroxy - TSH + free T4 - Lipid Panel   Patient have been counseled extensively about nutrition and exercise  Return in about 3 months (around 06/07/2021) for Diabetes screening with PCP.  The patient was given clear instructions to go to ER or return to medical center if symptoms don't improve, worsen or new problems develop. The patient verbalized understanding. The patient was told to call to get lab results if they haven't heard anything in the next week.    JFeliberto Gottron  APRN, FNP-C [facility] and [place], [place]   03/07/2021, 2:30 PM

## 2021-03-07 NOTE — Patient Instructions (Signed)
Increase physical activities Avoid simple sugar drinks like sodas/diet sodas Eat plant based diet Follow up in 3 months for a new A1c lab. Call or come back with new or worsening symptoms.

## 2021-03-08 LAB — CMP14+EGFR
ALT: 9 IU/L (ref 0–32)
AST: 11 IU/L (ref 0–40)
Albumin/Globulin Ratio: 1.7 (ref 1.2–2.2)
Albumin: 4.4 g/dL (ref 3.8–4.8)
Alkaline Phosphatase: 64 IU/L (ref 44–121)
BUN/Creatinine Ratio: 14 (ref 9–23)
BUN: 10 mg/dL (ref 6–20)
Bilirubin Total: 0.3 mg/dL (ref 0.0–1.2)
CO2: 25 mmol/L (ref 20–29)
Calcium: 10.7 mg/dL — ABNORMAL HIGH (ref 8.7–10.2)
Chloride: 101 mmol/L (ref 96–106)
Creatinine, Ser: 0.69 mg/dL (ref 0.57–1.00)
Globulin, Total: 2.6 g/dL (ref 1.5–4.5)
Glucose: 81 mg/dL (ref 65–99)
Potassium: 4.5 mmol/L (ref 3.5–5.2)
Sodium: 140 mmol/L (ref 134–144)
Total Protein: 7 g/dL (ref 6.0–8.5)
eGFR: 115 mL/min/{1.73_m2} (ref >59)

## 2021-03-08 LAB — LIPID PANEL
Chol/HDL Ratio: 3.2 ratio (ref 0.0–4.4)
Cholesterol, Total: 186 mg/dL (ref 100–199)
HDL: 59 mg/dL (ref >39)
LDL Chol Calc (NIH): 104 mg/dL — ABNORMAL HIGH (ref 0–99)
Triglycerides: 129 mg/dL (ref 0–149)
VLDL Cholesterol Cal: 23 mg/dL (ref 5–40)

## 2021-03-08 LAB — CBC WITH DIFFERENTIAL/PLATELET
Basophils Absolute: 0 10*3/uL (ref 0.0–0.2)
Basos: 1 %
EOS (ABSOLUTE): 0.1 10*3/uL (ref 0.0–0.4)
Eos: 2 %
Hematocrit: 40.1 % (ref 34.0–46.6)
Hemoglobin: 13.1 g/dL (ref 11.1–15.9)
Immature Grans (Abs): 0 10*3/uL (ref 0.0–0.1)
Immature Granulocytes: 0 %
Lymphocytes Absolute: 2.9 10*3/uL (ref 0.7–3.1)
Lymphs: 49 %
MCH: 29.4 pg (ref 26.6–33.0)
MCHC: 32.7 g/dL (ref 31.5–35.7)
MCV: 90 fL (ref 79–97)
Monocytes Absolute: 0.5 10*3/uL (ref 0.1–0.9)
Monocytes: 8 %
Neutrophils Absolute: 2.4 10*3/uL (ref 1.4–7.0)
Neutrophils: 40 %
Platelets: 304 10*3/uL (ref 150–450)
RBC: 4.45 x10E6/uL (ref 3.77–5.28)
RDW: 12.3 % (ref 11.7–15.4)
WBC: 5.9 10*3/uL (ref 3.4–10.8)

## 2021-03-08 LAB — TSH+FREE T4
Free T4: 1.18 ng/dL (ref 0.82–1.77)
TSH: 1.95 u[IU]/mL (ref 0.450–4.500)

## 2021-03-13 ENCOUNTER — Telehealth: Payer: Self-pay

## 2021-03-13 NOTE — Telephone Encounter (Addendum)
Ramonita Lab from [facility] called and left VM on nurse line requesting a call back for information regarding Makena prescription 02/16/17-02/23/17. Callback number is [phone]. VM left stating I am returning call and representative can call our office back for assistance.

## 2021-03-14 NOTE — Telephone Encounter (Signed)
VM left by Ramonita Lab requesting call back. Returned call; left VM stating office will be open until 12 PM.

## 2021-03-15 NOTE — Telephone Encounter (Signed)
Received a voicemail from 03/14/21 am stating she is Natasha [name] and calling back for Natasha [name] - states sorry she missed her call. She just needs to get diagnosis code and we may leave a message because it is a secure line. Kemonte Ullman,RN

## 2021-03-18 NOTE — Telephone Encounter (Signed)
Ramonita Lab left another phone message she is from [employer] on behalf of Medicaid need Diagnosis codes for [place]. I called and gave her needed codes. Yuko Coventry,RN

## 2022-01-02 ENCOUNTER — Encounter (HOSPITAL_COMMUNITY): Payer: Self-pay | Admitting: Emergency Medicine

## 2022-01-02 ENCOUNTER — Ambulatory Visit (HOSPITAL_COMMUNITY)
Admission: EM | Admit: 2022-01-02 | Discharge: 2022-01-02 | Disposition: A | Discharge status: Home / Self Care | Payer: Medicaid Other | Attending: Emergency Medicine | Admitting: Emergency Medicine

## 2022-01-02 DIAGNOSIS — S0181XA Laceration without foreign body of other part of head, initial encounter: Secondary | ICD-10-CM | POA: Diagnosis not present

## 2022-01-02 MED ORDER — LIDOCAINE-EPINEPHRINE 1 %-1:100000 IJ SOLN
INTRAMUSCULAR | Status: AC
  Filled 2022-01-02: qty 1

## 2022-01-02 NOTE — Discharge Instructions (Signed)
Please apply antibiotic ointment to the area twice a day  Return in 5-7 days for removal of the sutures.
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
## 2022-01-07 ENCOUNTER — Other Ambulatory Visit: Payer: Self-pay

## 2022-01-07 ENCOUNTER — Encounter (HOSPITAL_COMMUNITY): Payer: Self-pay | Admitting: *Deleted

## 2022-01-07 ENCOUNTER — Ambulatory Visit (HOSPITAL_COMMUNITY)
Admission: EM | Admit: 2022-01-07 | Discharge: 2022-01-07 | Disposition: A | Discharge status: Home / Self Care | Payer: Medicaid Other

## 2022-01-07 NOTE — ED Triage Notes (Signed)
Pt present for suture removal  on Lt side of face.

## 2022-01-25 IMAGING — US US OB EACH ADDL GEST<[ID]
2 series · 15 of 28 positions shown · non-contrast
Comparison: None.

CLINICAL DATA: Vaginal bleeding and cramping in first trimester of
pregnancy, quantitative beta HCG 3,998 today, LMP 10/15/2019

EXAM:
TWIN OBSTETRIC <14WK US AND TRANSVAGINAL OB US

[Series 2: us ob each addl gest<(id) · 14 of 105 slices shown (1 of 2)]
[im 1/105]
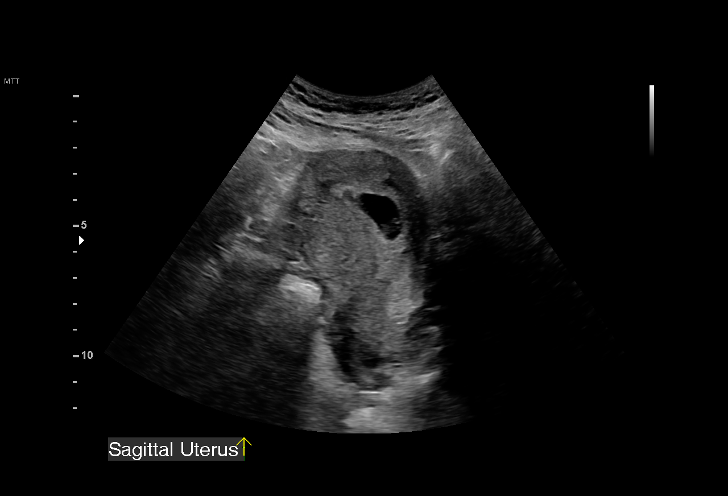
[im 9/105]
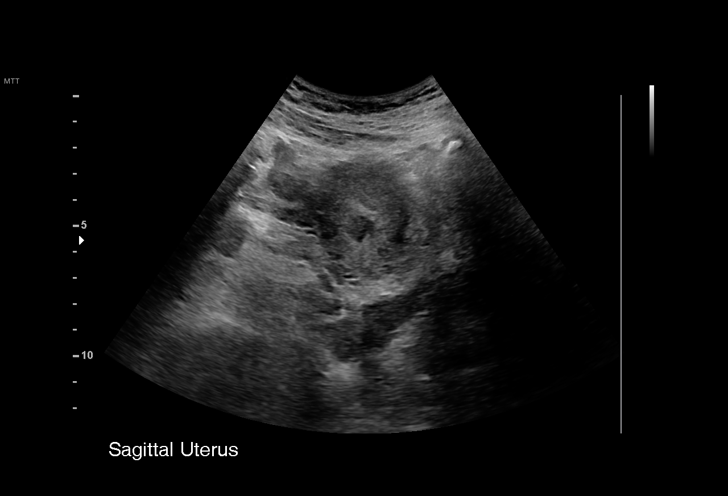
[im 17/105]
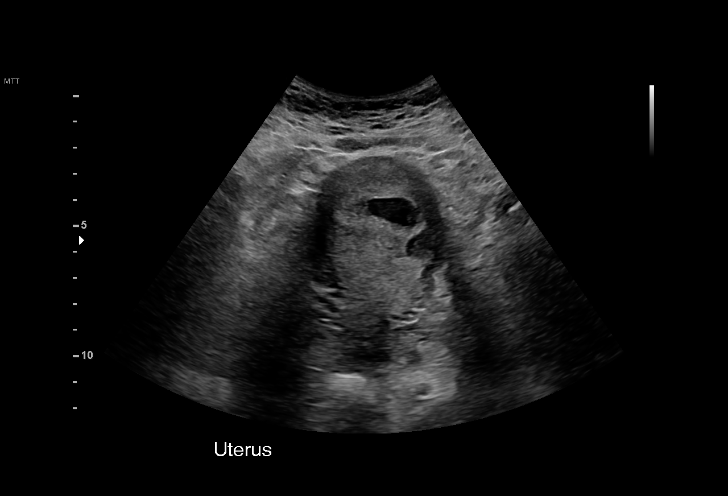
[im 25/105]
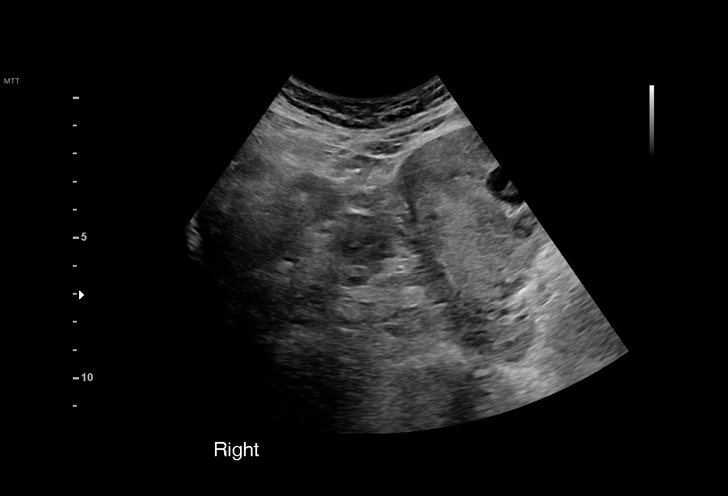
[im 33/105]
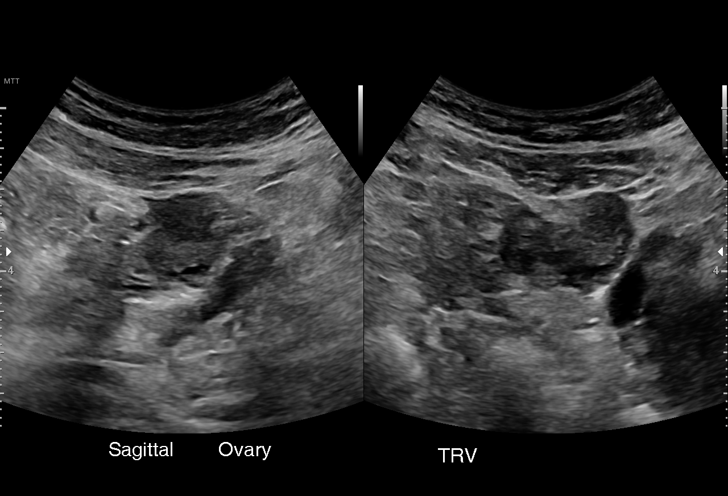
[im 41/105]
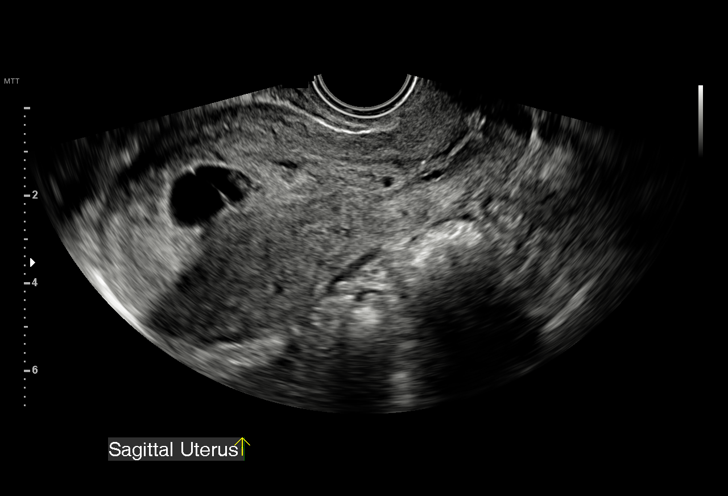
[im 49/105]
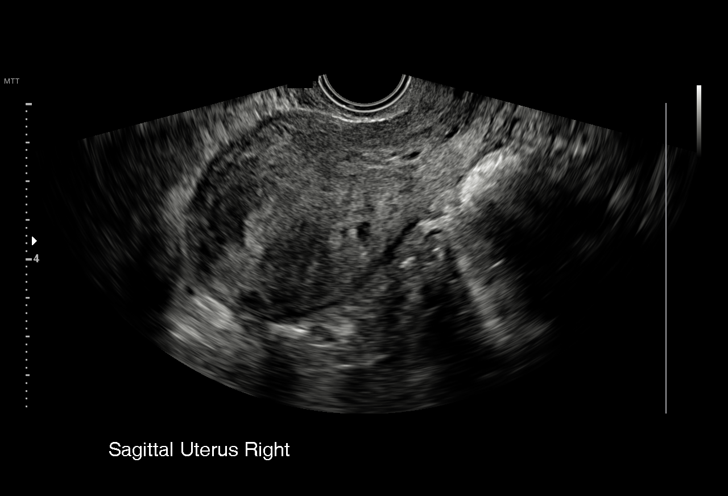
[im 57/105]
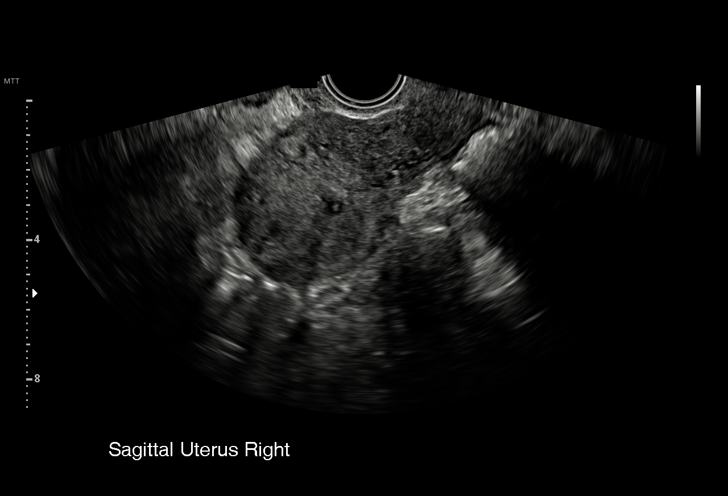
[im 61/105]
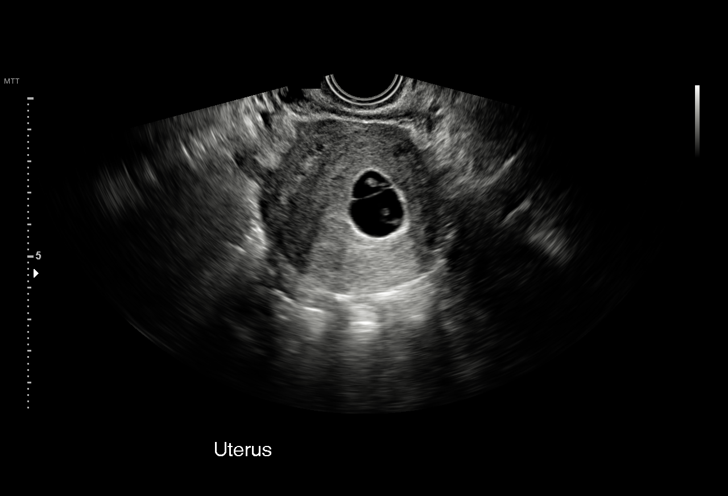
[im 69/105]
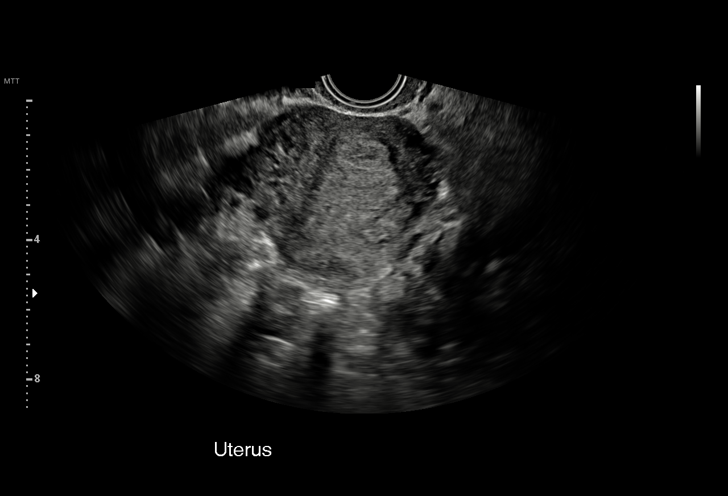
[im 77/105]
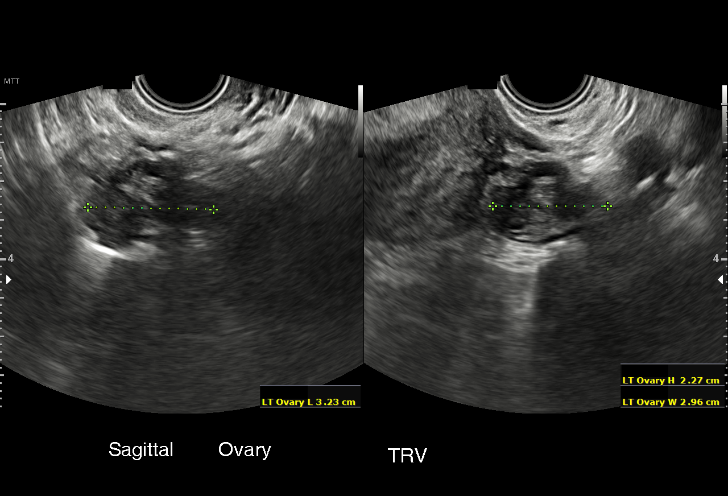
[im 85/105]
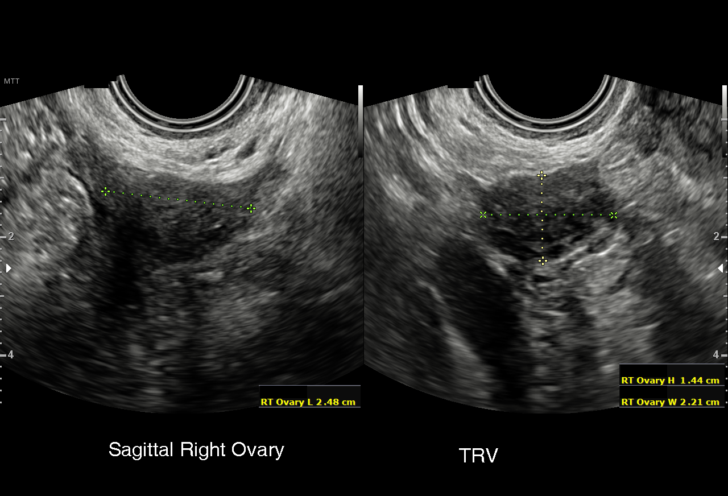
[im 93/105]
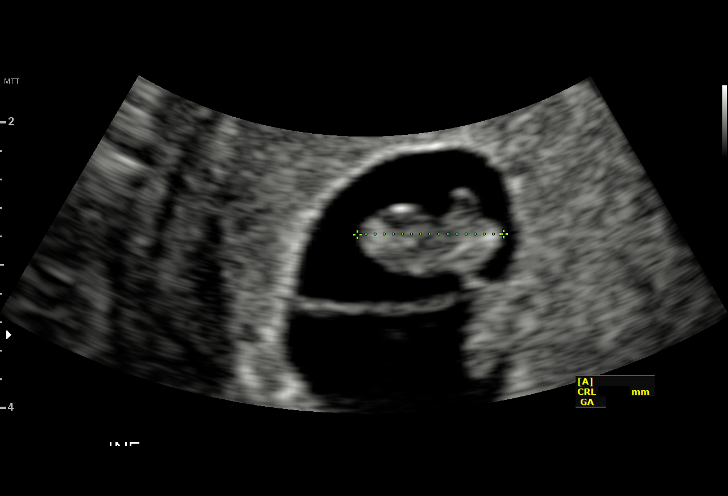
[im 101/105]
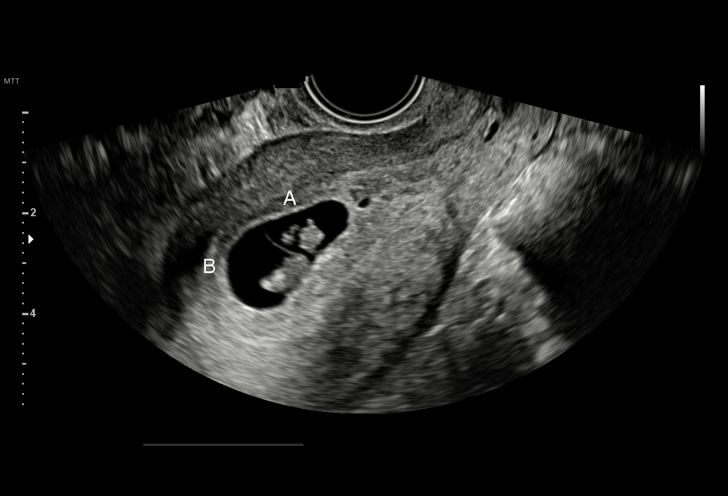

[Series 4: us ob each addl gest<(id) · 2 acquisitions, 1 frame shown (2 of 2)]
[im 1/2]
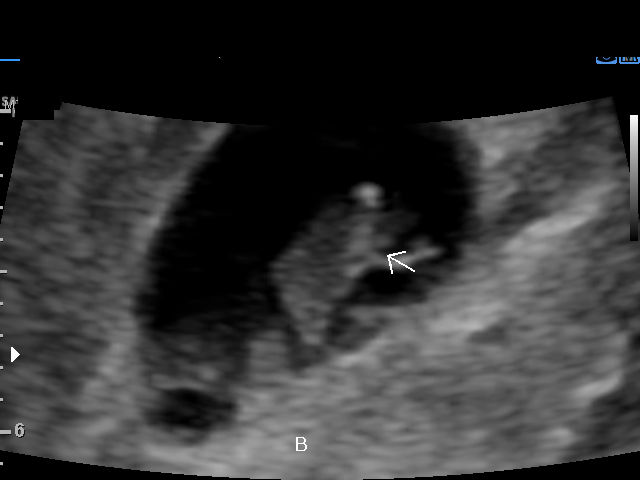

[15 of 28 positions shown; findings below may reference images not displayed]

FINDINGS: Number of IUPs:  2

Chorionicity/Amnionicity:  Monochorionic-diamniotic (thin membrane)

TWIN 1

Yolk sac:  Not identified

Embryo:  Present

Cardiac Activity: Not identified

Heart Rate: N/A bpm

CRL:  10.0 mm   7 w 1 d                  US EDC: 08/02/2020

TWIN 2

Yolk sac:  Not identified

Embryo:  Present

Cardiac Activity: Not identified

Heart Rate: N/A bpm

CRL:  10.4 mm   7 w 1 d                  US EDC: 08/02/2020

Small subchronic hemorrhage

Subchorionic hemorrhage:  None visualized.

Maternal uterus/adnexae:

Maternal uterus anteverted with several tiny intramural leiomyomata
identified.

LEFT ovary normal size and morphology, 3.2 x 2.3 x 3.0 cm.

RIGHT ovary normal size and morphology 2.5 x 1.4 x 2.2 cm.

Trace free pelvic fluid.

No adnexal masses.
IMPRESSION: Twin intrauterine gestations identified as above.

No fetal cardiac activity is seen within either fetal pole.

Findings meet definitive criteria for failed pregnancy. This follows
SRU consensus guidelines: Diagnostic Criteria for Nonviable
Pregnancy Early in the First Trimester. N Engl J Med

## 2022-02-06 IMAGING — US US OB < 14 WEEKS - US OB TV
1 series · 15 of 28 positions shown · non-contrast
Comparison: Ultrasound 12/16/2019

CLINICAL DATA: Twin pregnancy.  Medical abortion 12/16/2019

EXAM:
OBSTETRIC <14 WK TRANSVAGINAL OB US
TECHNIQUE: Transvaginal examination was performed for complete evaluation of
the gestation as well as the maternal uterus, adnexal regions, and
pelvic cul-de-sac.

[Series 1: us ob < 14 weeks - us ob tv · 29 acquisitions, 15 frames shown]
[im 1/29]
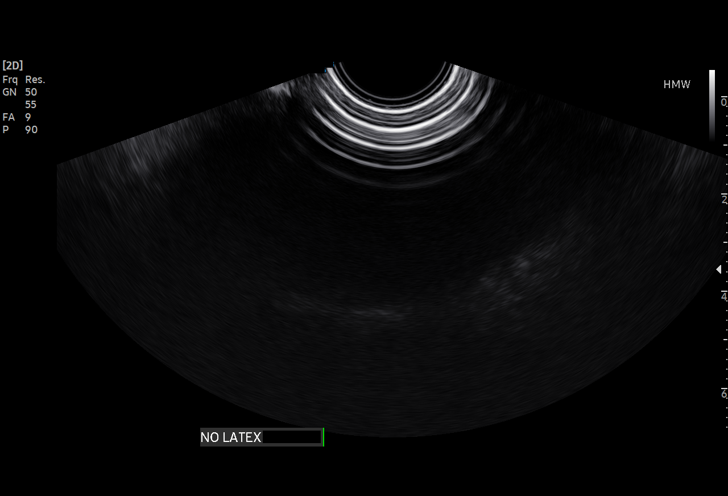
[im 3/29]
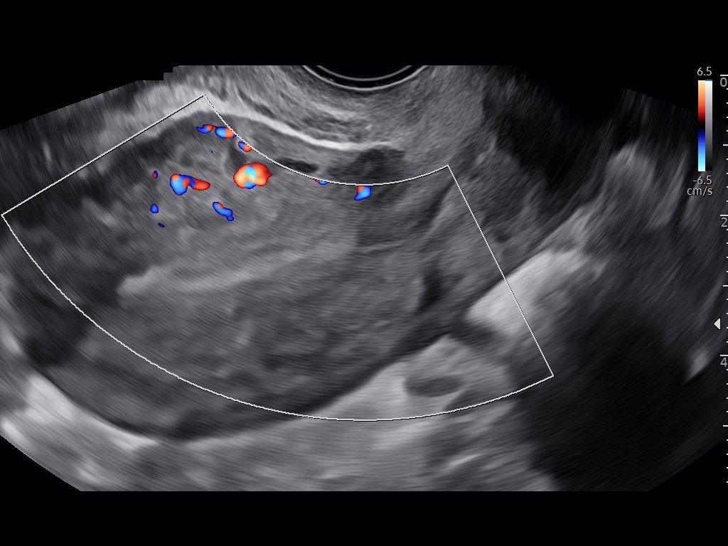
[im 5/29]
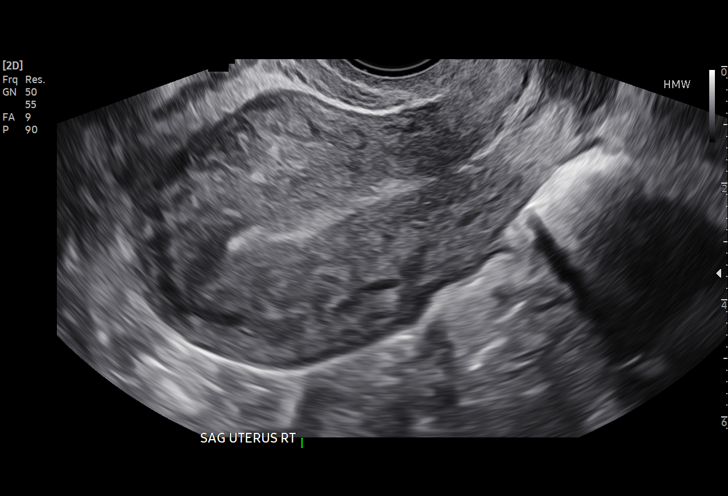
[im 7/29]
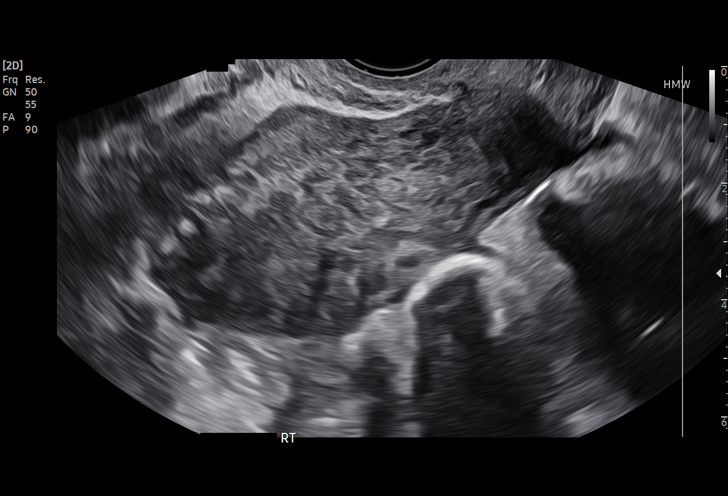
[im 9/29]
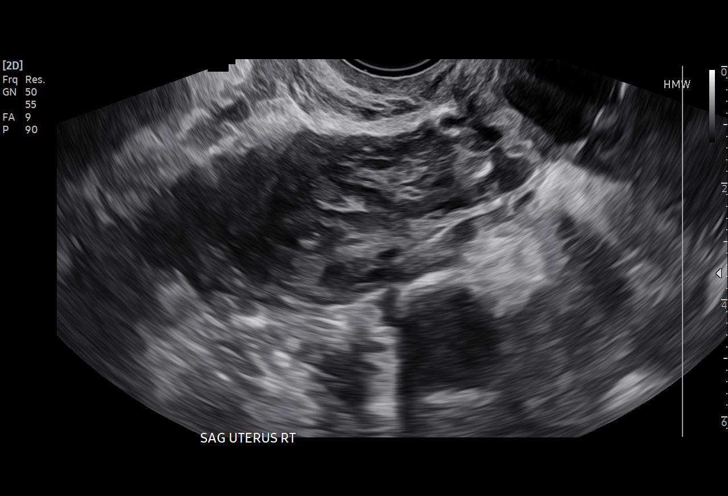
[im 11/29]
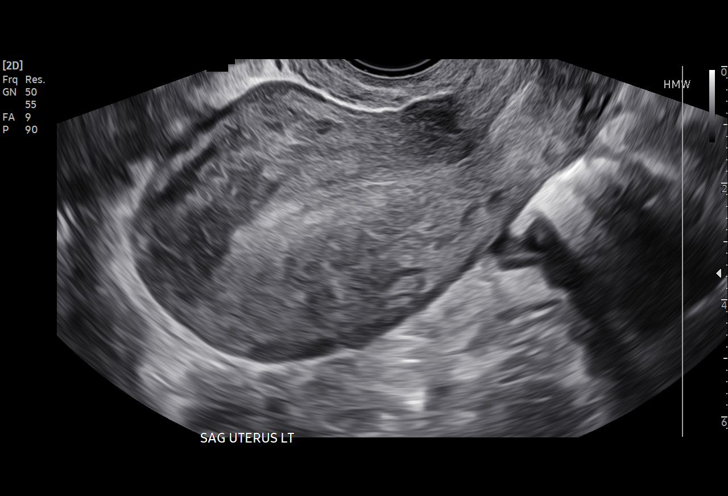
[im 13/29]
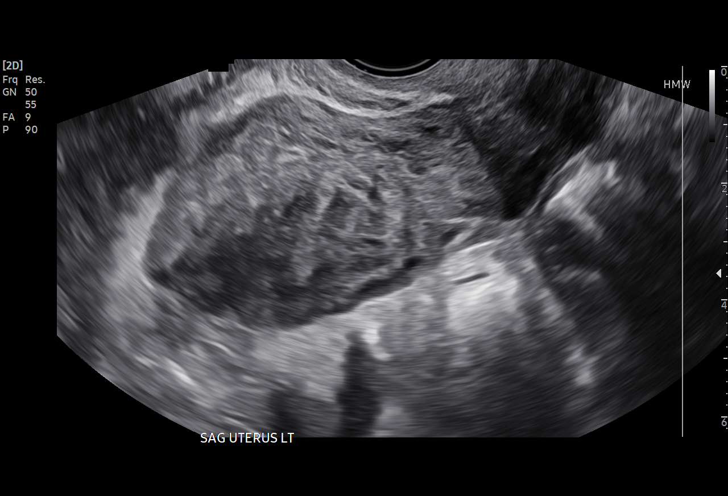
[im 15/29]
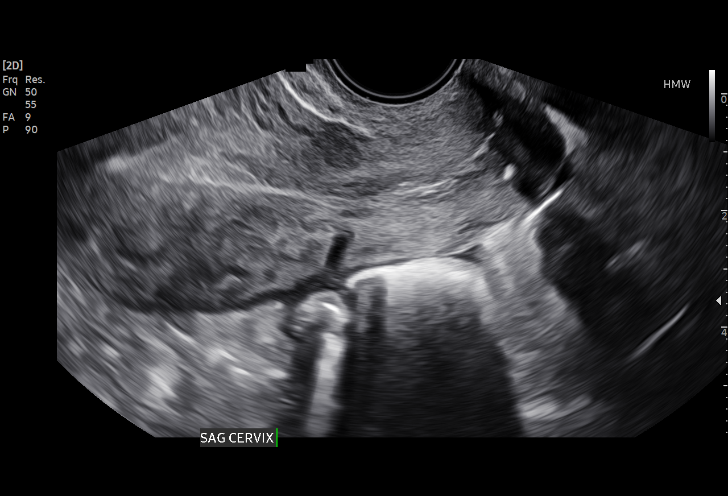
[im 16/29]
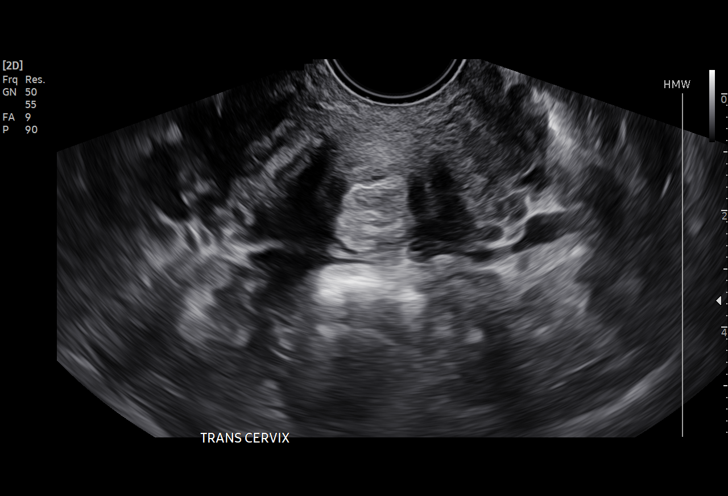
[im 18/29]
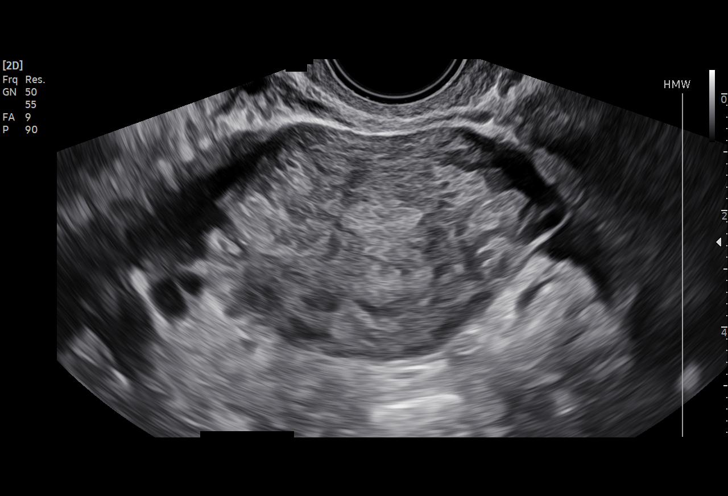
[im 20/29]
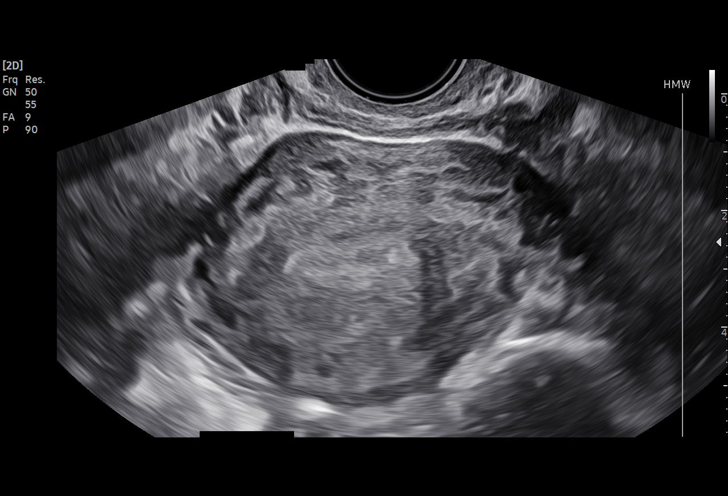
[im 22/29]
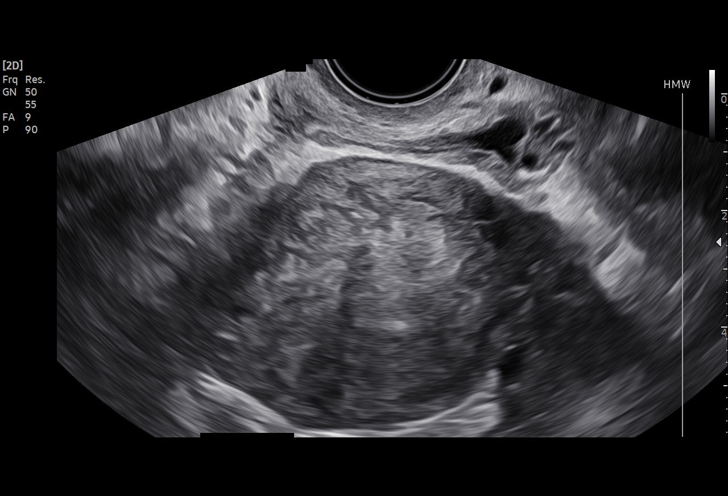
[im 24/29]
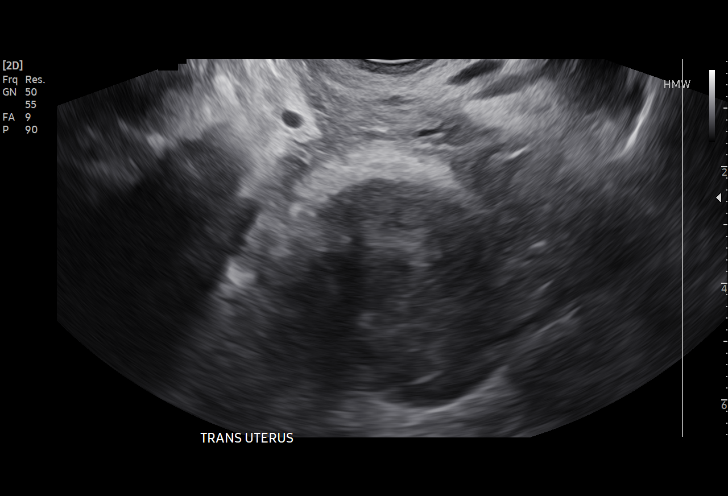
[im 26/29]
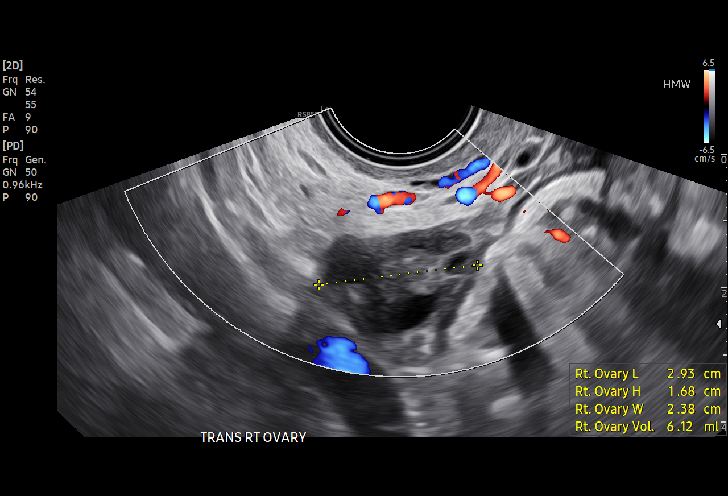
[im 29/29]
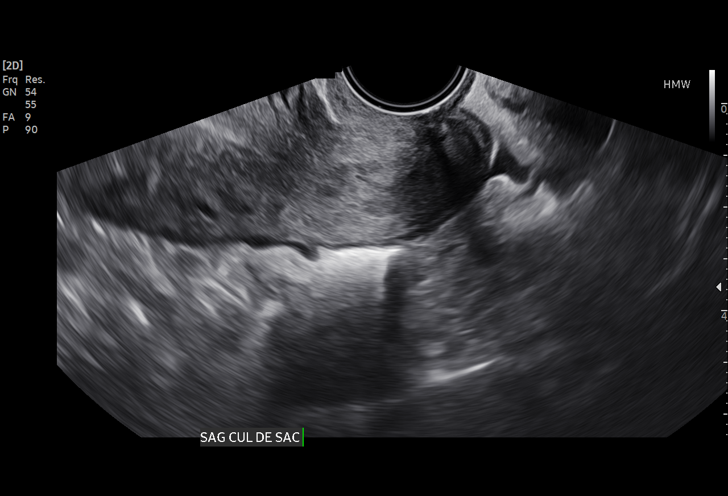

[15 of 28 positions shown; findings below may reference images not displayed]

FINDINGS: Intrauterine gestational sac: None

Yolk sac:  Not Visualized.

Embryo:  Not Visualized.

Cardiac Activity: Not Visualized.

Subchorionic hemorrhage:  None visualized.

Maternal uterus/adnexae: Anteverted uterus. Endometrial thickness of
9.5 mm. No intrauterine gestational sac. No fluid or debris within
the endometrial canal or lower uterine segment. The right ovary
measures 2.9 x 1.7 x 2.4 cm. Left ovary measures 3.0 x 1.9 x 2.6 cm.
No adnexal masses. A trace amount of simple free fluid is noted
within the cul-de-sac.
IMPRESSION: Unremarkable pelvic ultrasound. No intrauterine gestational sac. No
evidence of retained products of conception.

## 2022-03-07 ENCOUNTER — Ambulatory Visit (HOSPITAL_COMMUNITY)
Admission: EM | Admit: 2022-03-07 | Discharge: 2022-03-07 | Disposition: A | Discharge status: Home / Self Care | Payer: Medicaid Other | Attending: Emergency Medicine | Admitting: Emergency Medicine

## 2022-03-07 ENCOUNTER — Encounter (HOSPITAL_COMMUNITY): Payer: Self-pay | Admitting: Emergency Medicine

## 2022-03-07 ENCOUNTER — Ambulatory Visit (INDEPENDENT_AMBULATORY_CARE_PROVIDER_SITE_OTHER): Payer: Medicaid Other

## 2022-03-07 DIAGNOSIS — S6992XA Unspecified injury of left wrist, hand and finger(s), initial encounter: Secondary | ICD-10-CM | POA: Insufficient documentation

## 2022-03-07 DIAGNOSIS — M79645 Pain in left finger(s): Secondary | ICD-10-CM | POA: Diagnosis not present

## 2022-03-07 DIAGNOSIS — N898 Other specified noninflammatory disorders of vagina: Secondary | ICD-10-CM | POA: Diagnosis not present

## 2022-03-07 DIAGNOSIS — N930 Postcoital and contact bleeding: Secondary | ICD-10-CM | POA: Insufficient documentation

## 2022-03-07 NOTE — ED Triage Notes (Signed)
Pt reports abnormal vaginal discharge since Monday.  Also reports left thumb pain. States her thumb was smashed in a car door yesterday. Still able to move thumb, however pain increases with movement.

## 2022-03-07 NOTE — Discharge Instructions (Addendum)
Thumb x-ray does not show any signs of fracture. I recommend using ibuprofen and ice for symptoms.  Please return if your symptoms do not improve.  We will call you if anything returns positive on your swab.  You can call the women's clinic to set up an appointment and establish with a new OB/GYN.

## 2022-03-07 NOTE — ED Provider Notes (Signed)
[facility]    CSN: [id number] Arrival date & time: 03/07/22  1559     History   Chief Complaint Chief Complaint  Patient presents with   Vaginal Discharge   Finger Injury    HPI Natasha [name] is a 36 y.o. female.  Presents with increased vaginal discharge since Monday.  Reports sometimes it has an odor.  Denies any known exposure to STD. LMP 7/21, history of tubal ligation  Yesterday closed her left thumb in a car door.  She notes swelling to the thumb, is still able to move it but has some pain.  She applied ice last night.  Has not tried any medicines.  No bleeding or open lacerations  Reports occasional postcoital bleeding.  She does not remember when her last Pap smear was.  Does not have an OB/GYN. No pain with intercourse  Past Medical History:  Diagnosis Date   Gestational diabetes    Gestational diabetes mellitus (GDM), antepartum 06/03/2017   '[x]'$  aspirin 06/03/17   History of postpartum depression, currently pregnant 02/21/2011   History of preterm delivery 01/19/2017   3 preterm deliveries Had Makena 2012 with del at 36 wks Agrees to [facility] this time   Hypertension    Post partum depression 02/21/2011    Patient Active Problem List   Diagnosis Date Noted   Alpha thalassemia silent carrier 06/15/2020   History of four preterm deliveries, currently pregnant 05/03/2020    Past Surgical History:  Procedure Laterality Date   IUD REMOVAL     LAPAROSCOPY  02/06/2011   Procedure: LAPAROSCOPY OPERATIVE;  Surgeon: Osborne Oman, MD;  Location: [facility];  Service: Gynecology;  Laterality: N/A;  Removal of Mirena IUD from sigmoid serosa, enterolysis, proctoscopy   PROCTOSCOPY  02/06/2011   Procedure: PROCTOSCOPY;  Surgeon: Adin Hector, MD;  Location: [facility];  Service: General;  Laterality: N/A;   TUBAL LIGATION Bilateral 10/25/2020   Procedure: POST PARTUM TUBAL LIGATION;  Surgeon: Mora Bellman, MD;  Location: [facility];  Service: Gynecology;  Laterality:  Bilateral;    OB History     Gravida  6   Para  5   Term  0   Preterm  5   AB  1   Living  5      SAB  1   IAB  0   Ectopic  0   Multiple  0   Live Births  5            Home Medications    Prior to Admission medications   Not on File    Family History Family History  Problem Relation Age of Onset   Hypertension Paternal Grandmother    Hypertension Paternal Grandfather    Hypertension Maternal Grandmother    Hypertension Maternal Grandfather     Social History Social History   Tobacco Use   Smoking status: Never   Smokeless tobacco: Never  Vaping Use   Vaping Use: Never used  Substance Use Topics   Alcohol use: No   Drug use: No     Allergies   Patient has no known allergies.   Review of Systems Review of Systems  Genitourinary:  Positive for vaginal discharge.   Per HPI  Physical Exam Triage Vital Signs ED Triage Vitals  Enc Vitals Group     BP --      Pulse Rate 03/07/22 1627 88     Resp 03/07/22 1627 17     Temp 03/07/22  1627 99.2 F (37.3 C)     Temp Source 03/07/22 1627 Oral     SpO2 03/07/22 1627 100 %     Weight --      Height --      Head Circumference --      Peak Flow --      Pain Score 03/07/22 1626 4     Pain Loc --      Pain Edu? --      Excl. in [place]? --    No data found.  Updated Vital Signs Pulse 88   Temp 99.2 F (37.3 C) (Oral)   Resp 17   SpO2 100%    Physical Exam Vitals and nursing note reviewed. Exam conducted with a chaperone present.  Constitutional:      Appearance: Normal appearance.  HENT:     Mouth/Throat:     Pharynx: Oropharynx is clear.  Eyes:     Conjunctiva/sclera: Conjunctivae normal.  Cardiovascular:     Rate and Rhythm: Normal rate and regular rhythm.     Pulses: Normal pulses.     Heart sounds: Normal heart sounds.  Pulmonary:     Effort: Pulmonary effort is normal.     Breath sounds: Normal breath sounds.  Abdominal:     General: Bowel sounds are normal.      Tenderness: There is no abdominal tenderness.  Musculoskeletal:     Comments: Some swelling noted to the left thumb.  Full ROM of each joint, some tenderness with PIP motion. No snuffbox tenderness.  No nail damage.  No bruising or lacerations.  Cap refill less than 2 seconds  Skin:    Capillary Refill: Capillary refill takes less than 2 seconds.  Neurological:     Mental Status: She is alert and oriented to person, place, and time.     UC Treatments / Results  Labs (all labs ordered are listed, but only abnormal results are displayed) Labs Reviewed  CERVICOVAGINAL ANCILLARY ONLY    EKG  Radiology DG Finger Thumb Left  Result Date: 03/07/2022 CLINICAL DATA:  Left thumb was mast in the car door yesterday. Left thumb pain. EXAM: LEFT THUMB 2+V COMPARISON:  None available FINDINGS: Normal bone mineralization. Mild thumb carpometacarpal and triscaphe joint space narrowing. No acute fracture is seen. No dislocation. IMPRESSION: No acute fracture. Electronically Signed   By: Yvonne Kendall M.D.   On: 03/07/2022 17:29    Procedures Procedures   Medications Ordered in UC Medications - No data to display  Initial Impression / Assessment and Plan / UC Course  I have reviewed the triage vital signs and the nursing notes.  Pertinent labs & imaging results that were available during my care of the patient were reviewed by me and considered in my medical decision making (see chart for details).  Cytology swab pending.  Will treat as needed. Provided information for women's clinic, recommend follow up regarding post-coital bleeding.  Left thumb x-ray negative. Recommend ibuprofen, ice for swelling. Follow up with PCP as needed. Patient agrees to plan  Final Clinical Impressions(s) / UC Diagnoses   Final diagnoses:  Vaginal discharge  Injury of finger of left hand, initial encounter  PCB (post coital bleeding)     Discharge Instructions      Thumb x-ray does not show any signs of  fracture. I recommend using ibuprofen and ice for symptoms.  Please return if your symptoms do not improve.  We will call you if anything returns positive on your swab.  You can call the women's clinic to set up an appointment and establish with a new OB/GYN.     ED Prescriptions   None    PDMP not reviewed this encounter.   Edinson Domeier, Vernice [place] 03/07/22 1740

## 2022-03-10 LAB — CERVICOVAGINAL ANCILLARY ONLY
Bacterial Vaginitis (gardnerella): POSITIVE — AB
Candida Glabrata: NEGATIVE
Candida Vaginitis: NEGATIVE
Chlamydia: NEGATIVE
Comment: NEGATIVE
Comment: NEGATIVE
Comment: NEGATIVE
Comment: NEGATIVE
Comment: NEGATIVE
Comment: NORMAL
Neisseria Gonorrhea: NEGATIVE
Trichomonas: NEGATIVE

## 2022-03-11 ENCOUNTER — Telehealth (HOSPITAL_COMMUNITY): Payer: Self-pay | Admitting: Emergency Medicine

## 2022-03-11 MED ORDER — METRONIDAZOLE 500 MG PO TABS: 500.0000 mg | ORAL_TABLET | Freq: Two times a day (BID) | ORAL | 0 refills | Status: DC

## 2022-04-03 ENCOUNTER — Ambulatory Visit (HOSPITAL_COMMUNITY)
Admission: EM | Admit: 2022-04-03 | Discharge: 2022-04-03 | Disposition: A | Discharge status: Home / Self Care | Payer: Medicaid Other | Attending: Physician Assistant | Admitting: Physician Assistant

## 2022-04-03 ENCOUNTER — Ambulatory Visit (INDEPENDENT_AMBULATORY_CARE_PROVIDER_SITE_OTHER): Payer: Medicaid Other

## 2022-04-03 DIAGNOSIS — S20211A Contusion of right front wall of thorax, initial encounter: Secondary | ICD-10-CM

## 2022-04-03 DIAGNOSIS — R0781 Pleurodynia: Secondary | ICD-10-CM

## 2022-04-03 DIAGNOSIS — S2241XA Multiple fractures of ribs, right side, initial encounter for closed fracture: Secondary | ICD-10-CM | POA: Diagnosis not present

## 2022-04-03 MED ORDER — IBUPROFEN 600 MG PO TABS: 600.0000 mg | ORAL_TABLET | Freq: Three times a day (TID) | ORAL | 0 refills | Status: DC

## 2022-04-03 MED ORDER — TIZANIDINE HCL 4 MG PO TABS: 4.0000 mg | ORAL_TABLET | Freq: Three times a day (TID) | ORAL | 0 refills | Status: DC

## 2022-04-03 NOTE — Discharge Instructions (Addendum)
Advised to use ice therapy, 10 minutes on 20 minutes off, 3-4 times of the evening to help reduce pain and spasm. Take ibuprofen 600 mg every 8 hours with food on a regular basis for the next week to help reduce pain and swelling. Advised to take Zanaflex every 8 hours particularly at night to reduce muscle spasm and help give a better night sleep. Advised to watch for movements and avoid positions which aggravate the pain as it would take several weeks for the area to heal. Advised follow-up PCP or return to urgent care if symptoms fail to improve.
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
## 2022-04-23 ENCOUNTER — Ambulatory Visit (HOSPITAL_COMMUNITY)
Admission: EM | Admit: 2022-04-23 | Discharge: 2022-04-23 | Disposition: A | Discharge status: Home / Self Care | Payer: Medicaid Other | Attending: Family Medicine | Admitting: Family Medicine

## 2022-04-23 ENCOUNTER — Encounter (HOSPITAL_COMMUNITY): Payer: Self-pay

## 2022-04-23 DIAGNOSIS — N760 Acute vaginitis: Secondary | ICD-10-CM | POA: Insufficient documentation

## 2022-04-23 MED ORDER — METRONIDAZOLE 500 MG PO TABS: 500.0000 mg | ORAL_TABLET | Freq: Two times a day (BID) | ORAL | 0 refills | Status: AC

## 2022-04-23 NOTE — ED Triage Notes (Signed)
Pt is here for vaginal itching x 3days

## 2022-04-23 NOTE — ED Provider Notes (Signed)
[place]    CSN: [id number] Arrival date & time: 04/23/22  1913      History   Chief Complaint Chief Complaint  Patient presents with   Vaginal Itching    HPI Natasha [name] is a 36 y.o. female.    Vaginal Itching   Here for vaginal itching and maybe a little discharge in the last 2 or 3 days.  No abdominal pain.  Last menstrual cycle was September 20.  She denies the possibility of being pregnant currently.  No fever or vomiting.  She states it feels like when she has had BV in the past  Past Medical History:  Diagnosis Date   Gestational diabetes    Gestational diabetes mellitus (GDM), antepartum 06/03/2017   '[x]'$  aspirin 06/03/17   History of postpartum depression, currently pregnant 02/21/2011   History of preterm delivery 01/19/2017   3 preterm deliveries Had [place] 2012 with del at 64 wks Agrees to [facility] this time   Hypertension    Post partum depression 02/21/2011    Patient Active Problem List   Diagnosis Date Noted   Alpha thalassemia silent carrier 06/15/2020   History of four preterm deliveries, currently pregnant 05/03/2020    Past Surgical History:  Procedure Laterality Date   IUD REMOVAL     LAPAROSCOPY  02/06/2011   Procedure: LAPAROSCOPY OPERATIVE;  Surgeon: Osborne Oman, MD;  Location: [facility] [place];  Service: Gynecology;  Laterality: N/A;  Removal of Mirena IUD from sigmoid serosa, enterolysis, proctoscopy   PROCTOSCOPY  02/06/2011   Procedure: PROCTOSCOPY;  Surgeon: Adin Hector, MD;  Location: [facility];  Service: General;  Laterality: N/A;   TUBAL LIGATION Bilateral 10/25/2020   Procedure: POST PARTUM TUBAL LIGATION;  Surgeon: Mora Bellman, MD;  Location: [place] [facility];  Service: Gynecology;  Laterality: Bilateral;    OB History     Gravida  6   Para  5   Term  0   Preterm  5   AB  1   Living  5      SAB  1   IAB  0   Ectopic  0   Multiple  0   Live Births  5            Home Medications    Prior to  Admission medications   Medication Sig Start Date End Date Taking? Authorizing Provider  metroNIDAZOLE (FLAGYL) 500 MG tablet Take 1 tablet (500 mg total) by mouth 2 (two) times daily for 7 days. 04/23/22 04/30/22 Yes Barrett Henle, MD    Family History Family History  Problem Relation Age of Onset   Hypertension Paternal Grandmother    Hypertension Paternal Grandfather    Hypertension Maternal Grandmother    Hypertension Maternal Grandfather     Social History Social History   Tobacco Use   Smoking status: Never   Smokeless tobacco: Never  Vaping Use   Vaping Use: Never used  Substance Use Topics   Alcohol use: No   Drug use: No     Allergies   Patient has no known allergies.   Review of Systems Review of Systems   Physical Exam Triage Vital Signs ED Triage Vitals  Enc Vitals Group     BP 04/23/22 2021 138/84     Pulse Rate 04/23/22 2021 72     Resp 04/23/22 2021 12     Temp 04/23/22 2021 99 F (37.2 C)     Temp Source 04/23/22 2021 Oral  SpO2 04/23/22 2021 97 %     Weight --      Height 04/23/22 2018 '5\' 1"'$  (1.549 m)     Head Circumference --      Peak Flow --      Pain Score 04/23/22 2018 0     Pain Loc --      Pain Edu? --      Excl. in [place]? --    No data found.  Updated Vital Signs BP 138/84 (BP Location: Left Arm)   Pulse 72   Temp 99 F (37.2 C) (Oral)   Resp 12   Ht '5\' 1"'$  (1.549 m)   LMP 04/16/2022   SpO2 97%   BMI 26.07 kg/m   Visual Acuity Right Eye Distance:   Left Eye Distance:   Bilateral Distance:    Right Eye Near:   Left Eye Near:    Bilateral Near:     Physical Exam Vitals reviewed.  Constitutional:      General: She is not in acute distress.    Appearance: She is normal weight. She is not ill-appearing, toxic-appearing or diaphoretic.  Skin:    Coloration: Skin is not jaundiced or pale.  Neurological:     Mental Status: She is alert and oriented to person, place, and time.  Psychiatric:        Behavior:  Behavior normal.      UC Treatments / Results  Labs (all labs ordered are listed, but only abnormal results are displayed) Labs Reviewed  CERVICOVAGINAL ANCILLARY ONLY    EKG   Radiology No results found.  Procedures Procedures (including critical care time)  Medications Ordered in UC Medications - No data to display  Initial Impression / Assessment and Plan / UC Course  I have reviewed the triage vital signs and the nursing notes.  Pertinent labs & imaging results that were available during my care of the patient were reviewed by me and considered in my medical decision making (see chart for details).        Self swab was done, and staff will notify her of any positives.  Metronidazole is sent to treat empirically for possible BV. Final Clinical Impressions(s) / UC Diagnoses   Final diagnoses:  Acute vaginitis     Discharge Instructions      We will notify you if anything is positive on the swab  Take metronidazole 500 mg--1 tablet 2 times daily for 7 days.  Avoid drinking alcohol within 72 hours of taking this medication.  This is for possible BV      ED Prescriptions     Medication Sig Dispense Auth. Provider   metroNIDAZOLE (FLAGYL) 500 MG tablet Take 1 tablet (500 mg total) by mouth 2 (two) times daily for 7 days. 14 tablet Jashira Cotugno, Gwenlyn Perking, MD      PDMP not reviewed this encounter.   Barrett Henle, MD 04/23/22 2043

## 2022-04-23 NOTE — Discharge Instructions (Signed)
We will notify you if anything is positive on the swab  Take metronidazole 500 mg--1 tablet 2 times daily for 7 days.  Avoid drinking alcohol within 72 hours of taking this medication.  This is for possible BV

## 2022-04-24 LAB — CERVICOVAGINAL ANCILLARY ONLY
Bacterial Vaginitis (gardnerella): POSITIVE — AB
Candida Glabrata: NEGATIVE
Candida Vaginitis: NEGATIVE
Chlamydia: NEGATIVE
Comment: NEGATIVE
Comment: NEGATIVE
Comment: NEGATIVE
Comment: NEGATIVE
Comment: NEGATIVE
Comment: NORMAL
Neisseria Gonorrhea: NEGATIVE
Trichomonas: NEGATIVE

## 2022-10-27 ENCOUNTER — Ambulatory Visit: Payer: Medicaid Other | Admitting: Family Medicine

## 2022-10-29 IMAGING — US US MFM OB FOLLOW-UP
1 series · 13 of 28 positions shown · non-contrast
Comparison: none

[Series 1: us mfm ob follow-up · 13 of 39 slices shown]
[im 2/39]
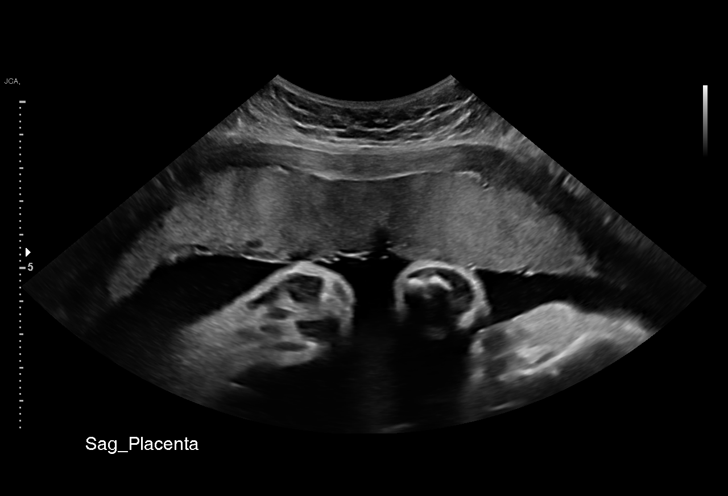
[im 5/39]
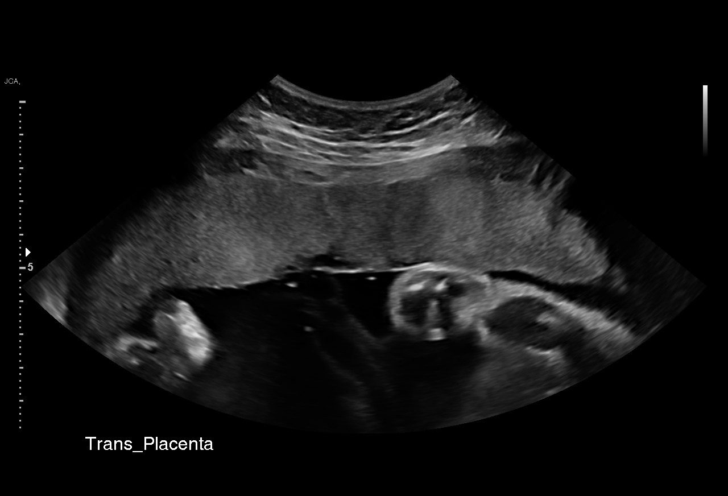
[im 8/39]
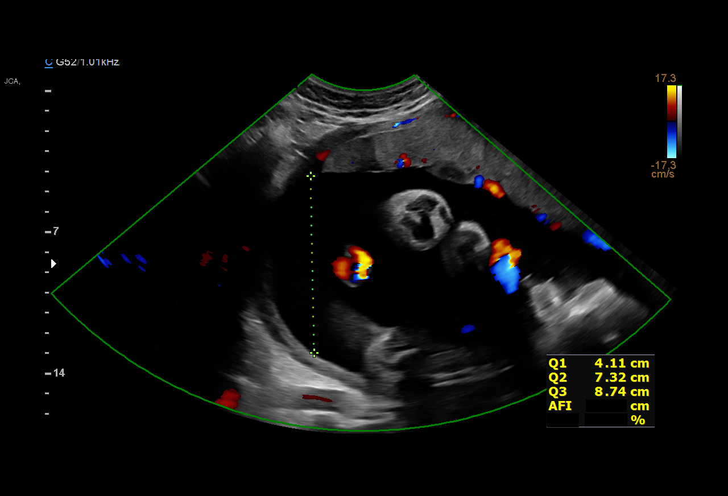
[im 10/39]
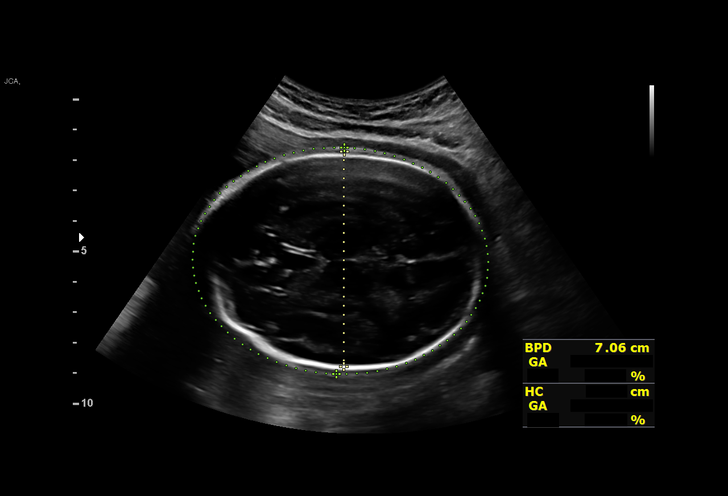
[im 13/39]
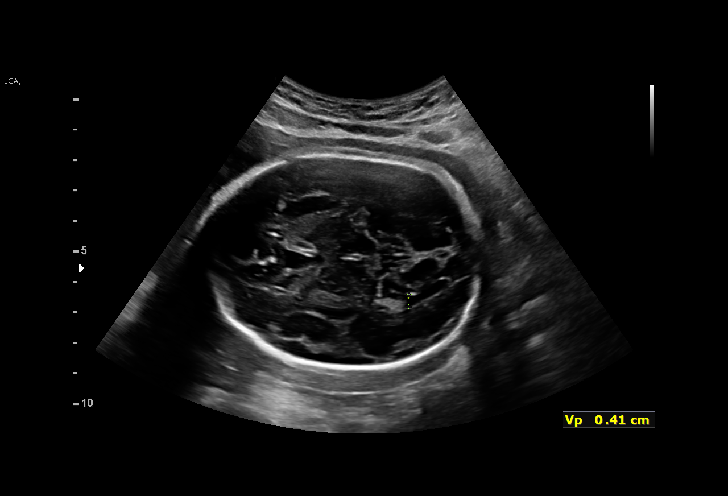
[im 16/39]
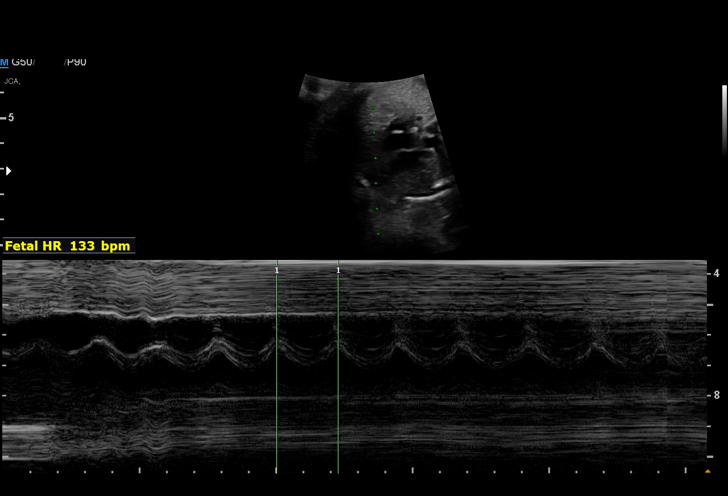
[im 20/39]
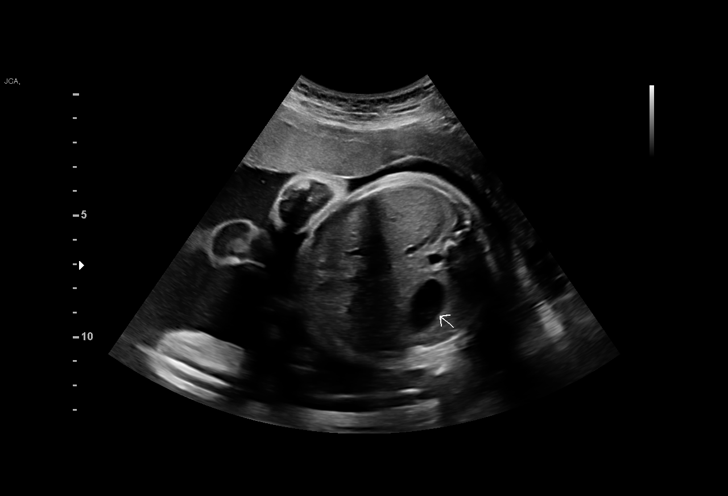
[im 23/39]
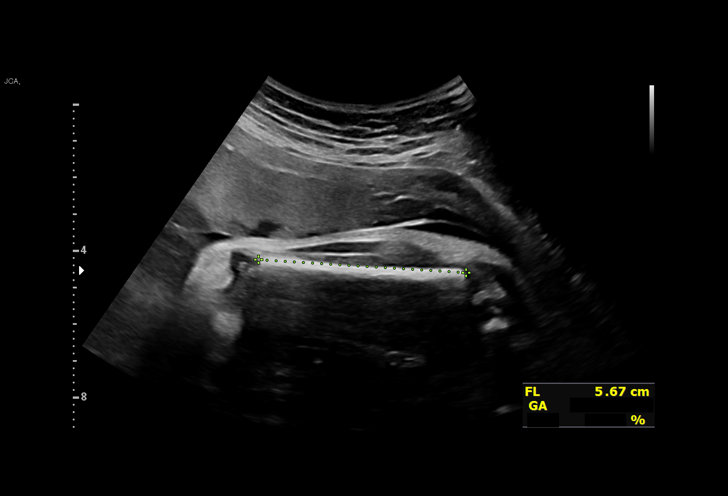
[im 26/39]
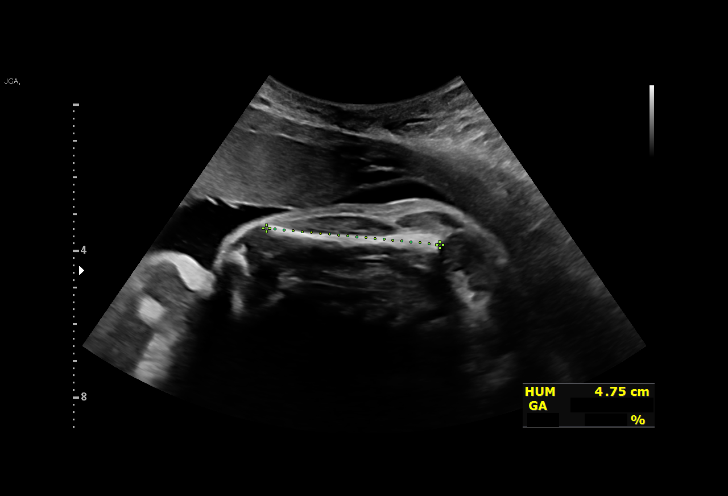
[im 29/39]
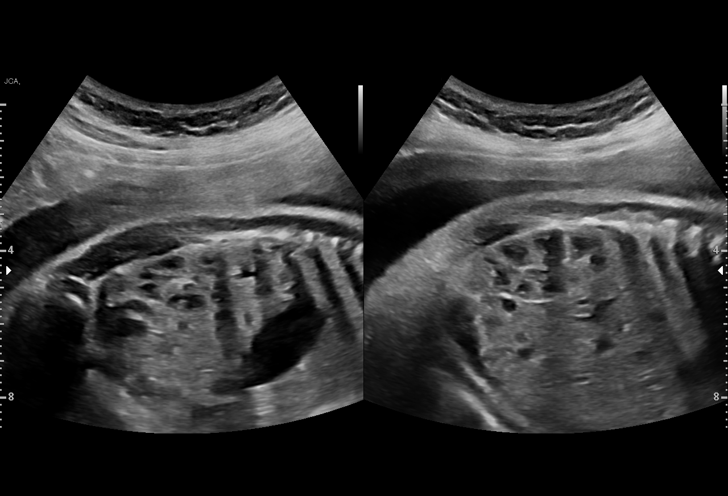
[im 31/39]
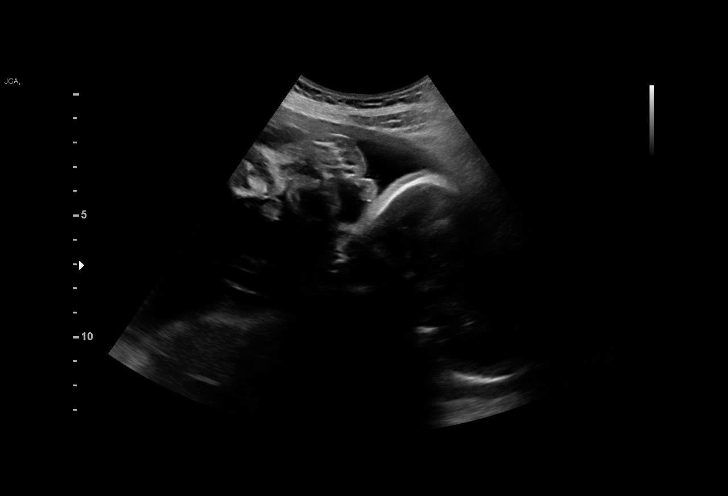
[im 34/39]
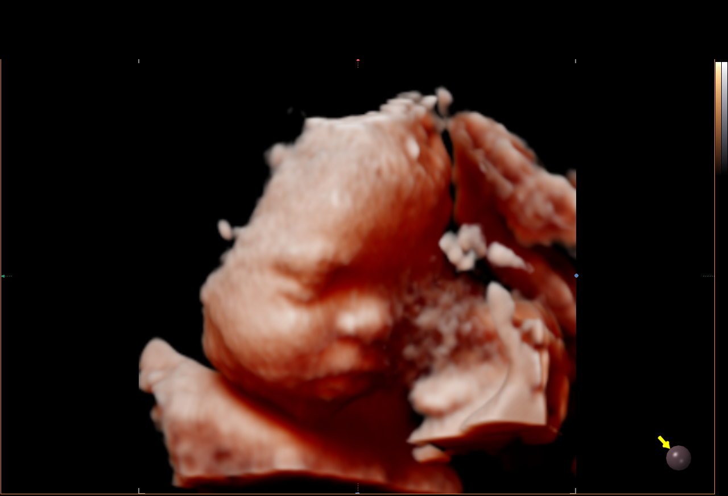
[im 37/39]
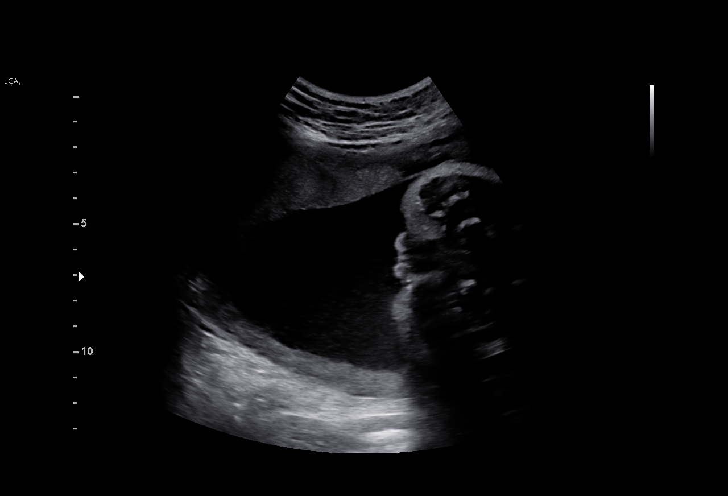

[13 of 28 positions shown; findings below may reference images not displayed]

TOMASA

Indications

 Gestational diabetes in pregnancy,
 controlled by oral hypoglycemic drugs
 (Metformin)
 Advanced maternal age multigravida 35+,
 second trimester
 Genetic carrier (Silent carrier for Jon Dan Bsh)[id number]
 Poor obstetric history: Previous preterm
 delivery, antepartum x4
 Poor obstetric history: Previous gestational
 diabetes
 Encounter for other antenatal screening
 follow-up
 28 weeks gestation of pregnancy
Fetal Evaluation

 Num Of Fetuses:         1
 Fetal Heart Rate(bpm):  133
 Cardiac Activity:       Observed
 Presentation:           Cephalic
 Placenta:               Anterior
 P. Cord Insertion:      Visualized

 Amniotic Fluid
 AFI FV:      Subjectively increased

 AFI Sum(cm)     %Tile       Largest Pocket(cm)
 23.01           95
 RUQ(cm)       RLQ(cm)       LUQ(cm)        LLQ(cm)

Biometry

 BPD:      70.1  mm     G. Age:  28w 1d         21  %    CI:        68.64   %    70 - 86
                                                         FL/HC:      21.0   %    19.6 -
 HC:      270.4  mm     G. Age:  29w 3d         40  %    HC/AC:      1.04        0.99 -
 AC:      260.8  mm     G. Age:  30w 2d         85  %    FL/BPD:     81.2   %    71 - 87
 FL:       56.9  mm     G. Age:  29w 6d         68  %    FL/AC:      21.8   %    20 - 24
 HUM:      47.4  mm     G. Age:  27w 6d         27  %
 LV:        4.1  mm

 Est. FW:    9564  gm      3 lb 4 oz     80  %
OB History

 Gravidity:    6         Prem:   4         SAB:   1
 Living:       4
Gestational Age

 LMP:           28w 5d        Date:  03/01/20                 EDD:   12/06/20
 U/S Today:     29w 3d                                        EDD:   12/01/20
 Best:          28w 5d     Det. By:  LMP  (03/01/20)          EDD:   12/06/20
Anatomy

 Cranium:               Previously seen        Aortic Arch:            Previously seen
 Cavum:                 Previously seen        Ductal Arch:            Previously seen
 Ventricles:            Appears normal         Diaphragm:              Appears normal
 Choroid Plexus:        Previously seen        Stomach:                Appears normal, left
                                                                       sided
 Cerebellum:            Previously seen        Abdomen:                Previously seen
 Posterior Fossa:       Previously seen        Abdominal Wall:         Previously seen
 Nuchal Fold:           Not applicable (>20    Cord Vessels:           Previously seen
                        wks GA)
 Face:                  Orbits and profile     Kidneys:                Appear normal
                        previously seen
 Lips:                  Previously seen        Bladder:                Previously seen
 Palate:                Previously seen        Spine:                  Previously seen
 Thoracic:              Appears normal         Upper Extremities:      Previously seen
 RVOT:                  Previously seen        Lower Extremities:      Previously seen
 LVOT:                  Previously seen

 Other:  Female gender previously seen. Nasal bone visualized. VC, 3VV and
         3VTV visualized.
Comments

 This patient was seen for a follow up growth scan due to
 gestational diabetes that is currently treated with Metformin.
 She denies any problems since her last exam.
 She was informed that the fetal growth and amniotic fluid
 level appears appropriate for her gestational age.
 Due to A2 gestational diabetes, we will start weekly fetal
 testing at around 32 weeks.
 A follow-up growth scan and biophysical profile was
 scheduled in 4 weeks.

## 2022-11-21 ENCOUNTER — Telehealth: Payer: Self-pay

## 2022-11-21 NOTE — Telephone Encounter (Signed)
LVM for patient to call back. AS, CMA 

## 2022-12-05 ENCOUNTER — Encounter (HOSPITAL_COMMUNITY): Payer: Self-pay

## 2022-12-05 ENCOUNTER — Other Ambulatory Visit: Payer: Self-pay

## 2022-12-05 ENCOUNTER — Emergency Department (HOSPITAL_COMMUNITY): Payer: Medicaid Other

## 2022-12-05 ENCOUNTER — Emergency Department (HOSPITAL_COMMUNITY)
Admission: EM | Admit: 2022-12-05 | Discharge: 2022-12-05 | Disposition: A | Discharge status: Home / Self Care | Payer: Medicaid Other | Attending: Emergency Medicine | Admitting: Emergency Medicine

## 2022-12-05 DIAGNOSIS — I10 Essential (primary) hypertension: Secondary | ICD-10-CM | POA: Diagnosis not present

## 2022-12-05 DIAGNOSIS — S0993XA Unspecified injury of face, initial encounter: Secondary | ICD-10-CM

## 2022-12-05 DIAGNOSIS — S0181XA Laceration without foreign body of other part of head, initial encounter: Secondary | ICD-10-CM | POA: Diagnosis not present

## 2022-12-05 DIAGNOSIS — S0990XA Unspecified injury of head, initial encounter: Secondary | ICD-10-CM | POA: Diagnosis not present

## 2022-12-05 DIAGNOSIS — S01112A Laceration without foreign body of left eyelid and periocular area, initial encounter: Secondary | ICD-10-CM | POA: Insufficient documentation

## 2022-12-05 MED ORDER — ACETAMINOPHEN 500 MG PO TABS
1000.0000 mg | ORAL_TABLET | Freq: Once | ORAL | Status: AC
  Administered 2022-12-05: 1000 mg via ORAL
  Filled 2022-12-05: qty 2

## 2022-12-05 MED ORDER — LIDOCAINE-EPINEPHRINE-TETRACAINE (LET) TOPICAL GEL
3.0000 mL | Freq: Once | TOPICAL | Status: AC
  Administered 2022-12-05: 3 mL via TOPICAL
  Filled 2022-12-05: qty 3
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
## 2022-12-10 ENCOUNTER — Other Ambulatory Visit: Payer: Self-pay

## 2022-12-10 ENCOUNTER — Emergency Department (HOSPITAL_COMMUNITY)
Admission: EM | Admit: 2022-12-10 | Discharge: 2022-12-10 | Disposition: A | Discharge status: Home / Self Care | Payer: Medicaid Other | Attending: Emergency Medicine | Admitting: Emergency Medicine

## 2022-12-10 ENCOUNTER — Encounter (HOSPITAL_COMMUNITY): Payer: Self-pay

## 2022-12-10 DIAGNOSIS — I10 Essential (primary) hypertension: Secondary | ICD-10-CM | POA: Diagnosis not present

## 2022-12-10 DIAGNOSIS — Z4802 Encounter for removal of sutures: Secondary | ICD-10-CM | POA: Insufficient documentation

## 2022-12-10 DIAGNOSIS — S01112D Laceration without foreign body of left eyelid and periocular area, subsequent encounter: Secondary | ICD-10-CM | POA: Diagnosis not present

## 2022-12-10 MED ORDER — KETOROLAC TROMETHAMINE 15 MG/ML IJ SOLN
15.0000 mg | Freq: Once | INTRAMUSCULAR | Status: DC
  Filled 2022-12-10: qty 1

## 2022-12-10 MED ORDER — ACETAMINOPHEN 500 MG PO TABS
1000.0000 mg | ORAL_TABLET | Freq: Once | ORAL | Status: DC
  Filled 2022-12-10: qty 2
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
## 2023-02-03 ENCOUNTER — Encounter (HOSPITAL_COMMUNITY): Payer: Self-pay | Admitting: Emergency Medicine

## 2023-02-03 ENCOUNTER — Ambulatory Visit (HOSPITAL_COMMUNITY)
Admission: EM | Admit: 2023-02-03 | Discharge: 2023-02-03 | Disposition: A | Discharge status: Home / Self Care | Payer: Medicaid Other | Attending: Emergency Medicine | Admitting: Emergency Medicine

## 2023-02-03 DIAGNOSIS — N760 Acute vaginitis: Secondary | ICD-10-CM | POA: Insufficient documentation

## 2023-02-03 LAB — POCT URINALYSIS DIP (MANUAL ENTRY)
Bilirubin, UA: NEGATIVE
Glucose, UA: NEGATIVE mg/dL
Leukocytes, UA: NEGATIVE
Nitrite, UA: NEGATIVE
Protein Ur, POC: 30 mg/dL — AB
Spec Grav, UA: 1.025 (ref 1.010–1.025)
Urobilinogen, UA: 1 E.U./dL
pH, UA: 7 (ref 5.0–8.0)

## 2023-02-03 LAB — POCT URINE PREGNANCY: Preg Test, Ur: NEGATIVE

## 2023-02-03 MED ORDER — LIDOCAINE 5 % EX OINT: 1.0000 | TOPICAL_OINTMENT | Freq: Four times a day (QID) | CUTANEOUS | 0 refills | Status: DC | PRN

## 2023-02-03 NOTE — ED Provider Notes (Signed)
[facility]    CSN: [id number] Arrival date & time: 02/03/23  0901    HISTORY   Chief Complaint  Patient presents with   Vaginal Discharge   HPI Natasha [name] is a pleasant, 37 y.o. female who presents to urgent care today. Patient complains of white vaginal discharge, vaginal discharge with fishy odor, and vaginal itching for the past 4 days.  Patient denies abnormal odor of urine, burning with urination, suprapubic pain, perineal pain, flank pain, fever, chills, malaise, rigors, significant fatigue, genital lesion(s), known exposure to STD, and possible exposure to STD.    The history is provided by the patient.   Past Medical History:  Diagnosis Date   Gestational diabetes    Gestational diabetes mellitus (GDM), antepartum 06/03/2017   [x]  aspirin 06/03/17   History of postpartum depression, currently pregnant 02/21/2011   History of preterm delivery 01/19/2017   3 preterm deliveries Had [facility] 2012 with del at 36 wks Agrees to [facility] this time   Hypertension    Post partum depression 02/21/2011   Patient Active Problem List   Diagnosis Date Noted   Alpha thalassemia silent carrier 06/15/2020   History of four preterm deliveries, currently pregnant 05/03/2020   Past Surgical History:  Procedure Laterality Date   IUD REMOVAL     LAPAROSCOPY  02/06/2011   Procedure: LAPAROSCOPY OPERATIVE;  Surgeon: Tereso Newcomer, MD;  Location: [facility];  Service: Gynecology;  Laterality: N/A;  Removal of Mirena IUD from sigmoid serosa, enterolysis, proctoscopy   PROCTOSCOPY  02/06/2011   Procedure: PROCTOSCOPY;  Surgeon: Ardeth Sportsman, MD;  Location: [facility];  Service: General;  Laterality: N/A;   TUBAL LIGATION Bilateral 10/25/2020   Procedure: POST PARTUM TUBAL LIGATION;  Surgeon: Catalina Antigua, MD;  Location: [facility];  Service: Gynecology;  Laterality: Bilateral;   OB History     Gravida  6   Para  5   Term  0   Preterm  5   AB  1   Living  5      SAB  1    IAB  0   Ectopic  0   Multiple  0   Live Births  5          Home Medications    Prior to Admission medications   Medication Sig Start Date End Date Taking? Authorizing Provider  Multiple Vitamin (MULTIVITAMIN) tablet Take 1 tablet by mouth daily.    [provider]  oxyCODONE-acetaminophen (PERCOCET) 10-325 MG tablet Take 0.5 tablets by mouth once.    [provider]    Family History Family History  Problem Relation Age of Onset   Hypertension Paternal Grandmother    Hypertension Paternal Grandfather    Hypertension Maternal Grandmother    Hypertension Maternal Grandfather    Social History Social History   Tobacco Use   Smoking status: Never   Smokeless tobacco: Never  Vaping Use   Vaping Use: Never used  Substance Use Topics   Alcohol use: No   Drug use: No   Allergies   Patient has no known allergies.  Review of Systems Review of Systems Pertinent findings revealed after performing a 14 point review of systems has been noted in the history of present illness.  Physical Exam Vital Signs BP 113/71 (BP Location: Left Arm)   Pulse 87   Temp 98.3 F (36.8 C) (Oral)   Resp 15   LMP 01/21/2023   SpO2 98%   No data  found.  Physical Exam Vitals and nursing note reviewed.  Constitutional:      General: She is not in acute distress.    Appearance: Normal appearance. She is not ill-appearing.  HENT:     Head: Normocephalic and atraumatic.  Eyes:     General: Lids are normal.        Right eye: No discharge.        Left eye: No discharge.     Extraocular Movements: Extraocular movements intact.     Conjunctiva/sclera: Conjunctivae normal.     Right eye: Right conjunctiva is not injected.     Left eye: Left conjunctiva is not injected.  Neck:     Trachea: Trachea and phonation normal.  Cardiovascular:     Rate and Rhythm: Normal rate and regular rhythm.     Pulses: Normal pulses.     Heart sounds: Normal heart sounds. No murmur  heard.    No friction rub. No gallop.  Pulmonary:     Effort: Pulmonary effort is normal. No accessory muscle usage, prolonged expiration or respiratory distress.     Breath sounds: Normal breath sounds. No stridor, decreased air movement or transmitted upper airway sounds. No decreased breath sounds, wheezing, rhonchi or rales.  Chest:     Chest wall: No tenderness.  Genitourinary:    Comments: Patient politely declines pelvic exam today, patient provided a vaginal swab for testing. Musculoskeletal:        General: Normal range of motion.     Cervical back: Normal range of motion and neck supple. Normal range of motion.  Lymphadenopathy:     Cervical: No cervical adenopathy.  Skin:    General: Skin is warm and dry.     Findings: No erythema or rash.  Neurological:     General: No focal deficit present.     Mental Status: She is alert and oriented to person, place, and time.  Psychiatric:        Mood and Affect: Mood normal.        Behavior: Behavior normal.     Visual Acuity Right Eye Distance:   Left Eye Distance:   Bilateral Distance:    Right Eye Near:   Left Eye Near:    Bilateral Near:     UC Couse / Diagnostics / Procedures:     Radiology No results found.  Procedures Procedures (including critical care time) EKG  Pending results:  Labs Reviewed  POCT URINALYSIS DIP (MANUAL ENTRY) - Abnormal; Notable for the following components:      Result Value   Ketones, POC UA moderate (40) (*)    Blood, UA moderate (*)    Protein Ur, POC =30 (*)    All other components within normal limits  POCT URINE PREGNANCY  CERVICOVAGINAL ANCILLARY ONLY    Medications Ordered in UC: Medications - No data to display  UC Diagnoses / Final Clinical Impressions(s)   I have reviewed the triage vital signs and the nursing notes.  Pertinent labs & imaging results that were available during my care of the patient were reviewed by me and considered in my medical decision making  (see chart for details).    Final diagnoses:  Acute vaginitis   Patient was provided with Lidocaine 5% ointment applied as needed for treatment of vulvovaginal irritation based on the history provided to me today. STD screening was performed, patient advised that the results be posted to their MyChart and if any of the results are positive, they will  be notified by phone, further treatment will be provided as indicated based on results of STD screening. Patient was advised to abstain from sexual intercourse until that they receive the results of their STD testing.  Patient was also advised to use condoms to protect themselves from STD exposure. Urinalysis today was normal. Urine pregnancy test was negative. Return precautions advised.  Drug allergies reviewed, all questions addressed.   Please see discharge instructions below for details of plan of care as provided to patient. ED Prescriptions     Medication Sig Dispense Auth. Provider   lidocaine (XYLOCAINE) 5 % ointment Apply 1 Application topically 4 (four) times daily as needed. 35.44 g Theadora Rama Scales, PA-C      PDMP not reviewed this encounter.  Disposition Upon Discharge:  Condition: stable for discharge home  Patient presented with concern for an acute illness with associated systemic symptoms and significant discomfort requiring urgent management. In my opinion, this is a condition that a prudent lay person (someone who possesses an average knowledge of health and medicine) may potentially expect to result in complications if not addressed urgently such as respiratory distress, impairment of bodily function or dysfunction of bodily organs.   As such, the patient has been evaluated and assessed, work-up was performed and treatment was provided in alignment with urgent care protocols and evidence based medicine.  Patient/parent/caregiver has been advised that the patient may require follow up for further testing and/or  treatment if the symptoms continue in spite of treatment, as clinically indicated and appropriate.  Routine symptom specific, illness specific and/or disease specific instructions were discussed with the patient and/or caregiver at length.  Prevention strategies for avoiding STD exposure were also discussed.  The patient will follow up with their current PCP if and as advised. If the patient does not currently have a PCP we will assist them in obtaining one.   The patient may need specialty follow up if the symptoms continue, in spite of conservative treatment and management, for further workup, evaluation, consultation and treatment as clinically indicated and appropriate.  Patient/parent/caregiver verbalized understanding and agreement of plan as discussed.  All questions were addressed during visit.  Please see discharge instructions below for further details of plan.  Discharge Instructions:   Discharge Instructions      The results of your vaginal swab test which screens for BV, yeast, gonorrhea, chlamydia and trichomonas will be made posted to your MyChart account once it is complete.  This typically takes 2 to 4 days.  Please abstain from sexual intercourse of any kind, vaginal, oral or anal, until you have received the results of your STD testing.     If any of your results are abnormal, you will receive a phone call regarding treatment.  Prescriptions, if any are needed, will be provided for you at your pharmacy.     For comfort, while you are waiting for the result of your vaginal swab test, I have provided you with an external vaginal cream that you can apply 3-4 times daily for relief of vaginal itching and burning.  I recommend that you abstain from sexual intercourse, tampon use or any other other intravaginal activities while you are waiting on these results and possible treatment.   Your urine pregnancy test today is negative.   Our point-of-care analysis of your urine sample  today was normal and did not reveal any concern for urinary tract infection.   If you have not had complete resolution of your symptoms after completing  any recommended treatment or if your symptoms worsen, please return for repeat evaluation.   Thank you for visiting urgent care today.  I appreciate the opportunity to participate in your care.       This office note has been dictated using Teaching laboratory technician.  Unfortunately, this method of dictation can sometimes lead to typographical or grammatical errors.  I apologize for your inconvenience in advance if this occurs.  Please do not hesitate to reach out to me if clarification is needed.       Theadora Rama Scales, PA-C 02/03/23 1113

## 2023-02-03 NOTE — ED Triage Notes (Signed)
Pt reports vaginal discharge that is white and foul odor for several days. Adds has little itching as well.

## 2023-02-03 NOTE — Discharge Instructions (Signed)
The results of your vaginal swab test which screens for BV, yeast, gonorrhea, chlamydia and trichomonas will be made posted to your MyChart account once it is complete.  This typically takes 2 to 4 days.  Please abstain from sexual intercourse of any kind, vaginal, oral or anal, until you have received the results of your STD testing.     If any of your results are abnormal, you will receive a phone call regarding treatment.  Prescriptions, if any are needed, will be provided for you at your pharmacy.     For comfort, while you are waiting for the result of your vaginal swab test, I have provided you with an external vaginal cream that you can apply 3-4 times daily for relief of vaginal itching and burning.  I recommend that you abstain from sexual intercourse, tampon use or any other other intravaginal activities while you are waiting on these results and possible treatment.   Your urine pregnancy test today is negative.   Our point-of-care analysis of your urine sample today was normal and did not reveal any concern for urinary tract infection.   If you have not had complete resolution of your symptoms after completing any recommended treatment or if your symptoms worsen, please return for repeat evaluation.   Thank you for visiting urgent care today.  I appreciate the opportunity to participate in your care.  

## 2023-02-04 LAB — CERVICOVAGINAL ANCILLARY ONLY
Bacterial Vaginitis (gardnerella): POSITIVE — AB
Candida Glabrata: NEGATIVE
Candida Vaginitis: NEGATIVE
Chlamydia: NEGATIVE
Comment: NEGATIVE
Comment: NEGATIVE
Comment: NEGATIVE
Comment: NEGATIVE
Comment: NEGATIVE
Comment: NORMAL
Neisseria Gonorrhea: NEGATIVE
Trichomonas: NEGATIVE

## 2023-02-05 ENCOUNTER — Telehealth (HOSPITAL_COMMUNITY): Payer: Self-pay | Admitting: Emergency Medicine

## 2023-02-05 ENCOUNTER — Encounter (HOSPITAL_COMMUNITY): Payer: Self-pay

## 2023-02-05 MED ORDER — METRONIDAZOLE 500 MG PO TABS: 500.0000 mg | ORAL_TABLET | Freq: Two times a day (BID) | ORAL | 0 refills | Status: DC

## 2023-03-14 ENCOUNTER — Encounter (HOSPITAL_COMMUNITY): Payer: Self-pay

## 2023-03-14 ENCOUNTER — Ambulatory Visit (HOSPITAL_COMMUNITY)
Admission: EM | Admit: 2023-03-14 | Discharge: 2023-03-14 | Disposition: A | Discharge status: Home / Self Care | Payer: Medicaid Other | Attending: Physician Assistant | Admitting: Physician Assistant

## 2023-03-14 DIAGNOSIS — Z113 Encounter for screening for infections with a predominantly sexual mode of transmission: Secondary | ICD-10-CM | POA: Insufficient documentation

## 2023-03-14 DIAGNOSIS — N898 Other specified noninflammatory disorders of vagina: Secondary | ICD-10-CM | POA: Insufficient documentation

## 2023-03-14 MED ORDER — METRONIDAZOLE 500 MG PO TABS: 500.0000 mg | ORAL_TABLET | Freq: Two times a day (BID) | ORAL | 0 refills | Status: DC

## 2023-03-14 NOTE — ED Triage Notes (Signed)
Pt is here for med refill on flagyl. Pt states she lost her mediation.

## 2023-03-14 NOTE — ED Provider Notes (Signed)
[facility]    CSN: [id number] Arrival date & time: 03/14/23  1450      History   Chief Complaint Chief Complaint  Patient presents with   Medication Refill    HPI Natasha [name] is a 37 y.o. female.   Patient presents today for medication refill.  She was seen by our clinic on 02/03/2023 and diagnosed with bacterial vaginosis based on swab result.  She was prescribed metronidazole but lost the medication before completing the course and so is requesting a refill.  She continues to have vaginal discharge with associated odor.  Denies any pelvic pain, abdominal pain, fever, nausea, vomiting.  She denies any changes to personal hygiene products including soaps or detergents.  She does report recurrent issues with bacterial vaginosis but does not currently follow with an OB/GYN.  She has been scheduled for OB/GYN evaluation including cervical cancer screening 04/22/2023.  She has no concern for pregnancy.  Denies any recent antibiotics.    Past Medical History:  Diagnosis Date   Gestational diabetes    Gestational diabetes mellitus (GDM), antepartum 06/03/2017   [x]  aspirin 06/03/17   History of postpartum depression, currently pregnant 02/21/2011   History of preterm delivery 01/19/2017   3 preterm deliveries Had [facility] 2012 with del at 36 wks Agrees to [facility] this time   Hypertension    Post partum depression 02/21/2011    Patient Active Problem List   Diagnosis Date Noted   Alpha thalassemia silent carrier 06/15/2020   History of four preterm deliveries, currently pregnant 05/03/2020    Past Surgical History:  Procedure Laterality Date   IUD REMOVAL     LAPAROSCOPY  02/06/2011   Procedure: LAPAROSCOPY OPERATIVE;  Surgeon: Tereso Newcomer, MD;  Location: [facility];  Service: Gynecology;  Laterality: N/A;  Removal of Mirena IUD from sigmoid serosa, enterolysis, proctoscopy   PROCTOSCOPY  02/06/2011   Procedure: PROCTOSCOPY;  Surgeon: Ardeth Sportsman, MD;  Location: [facility];   Service: General;  Laterality: N/A;   TUBAL LIGATION Bilateral 10/25/2020   Procedure: POST PARTUM TUBAL LIGATION;  Surgeon: Catalina Antigua, MD;  Location: [facility];  Service: Gynecology;  Laterality: Bilateral;    OB History     Gravida  6   Para  5   Term  0   Preterm  5   AB  1   Living  5      SAB  1   IAB  0   Ectopic  0   Multiple  0   Live Births  5            Home Medications    Prior to Admission medications   Medication Sig Start Date End Date Taking? Authorizing Provider  lidocaine (XYLOCAINE) 5 % ointment Apply 1 Application topically 4 (four) times daily as needed. 02/03/23   Theadora Rama Scales, PA-C  metroNIDAZOLE (FLAGYL) 500 MG tablet Take 1 tablet (500 mg total) by mouth 2 (two) times daily. 03/14/23   Akul Leggette, Noberto Retort, PA-C  Multiple Vitamin (MULTIVITAMIN) tablet Take 1 tablet by mouth daily.    [provider]    Family History Family History  Problem Relation Age of Onset   Hypertension Paternal Grandmother    Hypertension Paternal Grandfather    Hypertension Maternal Grandmother    Hypertension Maternal Grandfather     Social History Social History   Tobacco Use   Smoking status: Never   Smokeless tobacco: Never  Vaping Use   Vaping  status: Never Used  Substance Use Topics   Alcohol use: No   Drug use: No     Allergies   Patient has no known allergies.   Review of Systems Review of Systems  Constitutional:  Negative for activity change, appetite change, fatigue and fever.  Gastrointestinal:  Negative for abdominal pain, diarrhea, nausea and vomiting.  Genitourinary:  Positive for vaginal discharge. Negative for dysuria, frequency, pelvic pain, urgency, vaginal bleeding and vaginal pain.     Physical Exam Triage Vital Signs ED Triage Vitals [03/14/23 1516]  Encounter Vitals Group     BP 122/84     Systolic BP Percentile      Diastolic BP Percentile      Pulse Rate 89     Resp 16     Temp 98.1 F  (36.7 C)     Temp Source Oral     SpO2 97 %     Weight      Height      Head Circumference      Peak Flow      Pain Score      Pain Loc      Pain Education      Exclude from Growth Chart    No data found.  Updated Vital Signs BP 122/84 (BP Location: Left Arm)   Pulse 89   Temp 98.1 F (36.7 C) (Oral)   Resp 16   LMP 02/26/2023 (Approximate)   SpO2 97%   Visual Acuity Right Eye Distance:   Left Eye Distance:   Bilateral Distance:    Right Eye Near:   Left Eye Near:    Bilateral Near:     Physical Exam Vitals reviewed.  Constitutional:      General: She is awake. She is not in acute distress.    Appearance: Normal appearance. She is well-developed. She is not ill-appearing.     Comments: Very pleasant female appears stated age in no acute distress sitting comfortably in exam room  HENT:     Head: Normocephalic and atraumatic.  Cardiovascular:     Rate and Rhythm: Normal rate and regular rhythm.     Heart sounds: Normal heart sounds, S1 normal and S2 normal. No murmur heard. Pulmonary:     Effort: Pulmonary effort is normal.     Breath sounds: Normal breath sounds. No wheezing, rhonchi or rales.     Comments: Clear to auscultation bilaterally Abdominal:     General: Bowel sounds are normal.     Palpations: Abdomen is soft.     Tenderness: There is no abdominal tenderness. There is no right CVA tenderness, left CVA tenderness, guarding or rebound.     Comments: Benign abdominal exam  Psychiatric:        Behavior: Behavior is cooperative.      UC Treatments / Results  Labs (all labs ordered are listed, but only abnormal results are displayed) Labs Reviewed  CERVICOVAGINAL ANCILLARY ONLY    EKG   Radiology No results found.  Procedures Procedures (including critical care time)  Medications Ordered in UC Medications - No data to display  Initial Impression / Assessment and Plan / UC Course  I have reviewed the triage vital signs and the nursing  notes.  Pertinent labs & imaging results that were available during my care of the patient were reviewed by me and considered in my medical decision making (see chart for details).     Patient is well-appearing, afebrile, nontoxic, nontachycardic.  STI swab was  collected and is pending.  Will empirically treat for bacterial vaginosis given her clinical presentation and previous positive result where she did not complete her treatment.  Metronidazole sent to pharmacy we discussed that she is not to drink any alcohol with this medication due to risk of GI bleeding.  She can use hypoallergenic soaps and detergents and wear loosefitting cotton underwear.  Recommended follow-up with OB/GYN as scheduled next month to discuss recurrent symptoms.  We discussed that if she develops any worsening symptoms including pelvic pain, abdominal pain, fever, nausea, vomiting she needs to be seen emergently.  Should return precautions given.  Final Clinical Impressions(s) / UC Diagnoses   Final diagnoses:  Vaginal discharge  Screening examination for STI     Discharge Instructions      We are treating you for bacterial vaginosis.  Start metronidazole twice daily for 7 days.  Do not drink any alcohol while on this medication and for 3 days after completing course as it can cause you to vomit.  Wear loosefitting cotton underwear and use hypoallergenic soaps and detergents.  We will contact you if any to return additional treatment based on your swab result.  Follow-up with OB/GYN as scheduled in September and discuss recurrent symptoms with them.  If anything worsens or changes and you develop pelvic pain, abdominal pain, fever, nausea, vomiting you need to be seen immediately.    ED Prescriptions     Medication Sig Dispense Auth. Provider   metroNIDAZOLE (FLAGYL) 500 MG tablet Take 1 tablet (500 mg total) by mouth 2 (two) times daily. 14 tablet Yanelis Osika, Noberto Retort, PA-C      PDMP not reviewed this encounter.    Jeani Hawking, PA-C 03/14/23 1622

## 2023-03-14 NOTE — Discharge Instructions (Signed)
We are treating you for bacterial vaginosis.  Start metronidazole twice daily for 7 days.  Do not drink any alcohol while on this medication and for 3 days after completing course as it can cause you to vomit.  Wear loosefitting cotton underwear and use hypoallergenic soaps and detergents.  We will contact you if any to return additional treatment based on your swab result.  Follow-up with OB/GYN as scheduled in September and discuss recurrent symptoms with them.  If anything worsens or changes and you develop pelvic pain, abdominal pain, fever, nausea, vomiting you need to be seen immediately.

## 2023-03-16 LAB — CERVICOVAGINAL ANCILLARY ONLY
Bacterial Vaginitis (gardnerella): POSITIVE — AB
Candida Glabrata: NEGATIVE
Candida Vaginitis: NEGATIVE
Chlamydia: NEGATIVE
Comment: NEGATIVE
Comment: NEGATIVE
Comment: NEGATIVE
Comment: NEGATIVE
Comment: NEGATIVE
Comment: NORMAL
Neisseria Gonorrhea: NEGATIVE
Trichomonas: NEGATIVE

## 2023-04-22 ENCOUNTER — Ambulatory Visit: Payer: Medicaid Other | Admitting: Obstetrics & Gynecology

## 2023-04-23 ENCOUNTER — Ambulatory Visit: Payer: Medicaid Other | Admitting: Obstetrics and Gynecology

## 2023-04-23 ENCOUNTER — Other Ambulatory Visit (HOSPITAL_COMMUNITY)
Admission: RE | Admit: 2023-04-23 | Discharge: 2023-04-23 | Disposition: A | Discharge status: Home / Self Care | Payer: Medicaid Other | Source: Ambulatory Visit | Attending: Obstetrics & Gynecology | Admitting: Obstetrics & Gynecology

## 2023-04-23 ENCOUNTER — Other Ambulatory Visit: Payer: Self-pay

## 2023-04-23 ENCOUNTER — Encounter: Payer: Self-pay | Admitting: Obstetrics and Gynecology

## 2023-04-23 VITALS — BP 97/75 | HR 90 | Wt 124.9 lb

## 2023-04-23 DIAGNOSIS — Z1330 Encounter for screening examination for mental health and behavioral disorders, unspecified: Secondary | ICD-10-CM

## 2023-04-23 DIAGNOSIS — Z01419 Encounter for gynecological examination (general) (routine) without abnormal findings: Secondary | ICD-10-CM | POA: Insufficient documentation

## 2023-04-23 DIAGNOSIS — Z124 Encounter for screening for malignant neoplasm of cervix: Secondary | ICD-10-CM | POA: Insufficient documentation

## 2023-04-23 NOTE — Progress Notes (Signed)
ANNUAL EXAM Patient name: Natasha [name] MRN [id number]  Date of birth: 1985-10-05 Chief Complaint:   Gynecologic Exam  History of Present Illness:   Natasha [name] is a 37 y.o. [id number] being seen today for a routine annual exam.  Current complaints: recurrent BV  Menstrual concerns? Yes  having BV after a menses, has been using pads, more recent (started last year). No change in the  Breast or nipple changes? No left nipple retracted, has been there for some time  Contraception use? Yes T/L Sexually active? Yes female partner, no pain with intercourse   Patient's last menstrual period was 04/10/2023.   The pregnancy intention screening data noted above was reviewed. Potential methods of contraception were discussed. The patient elected to proceed with No data recorded.   Last pap     Component Value Date/Time   DIAGPAP  02/28/2020 0838    - Negative for intraepithelial lesion or malignancy (NILM)   HPVHIGH Negative 02/28/2020 0838   ADEQPAP  02/28/2020 0838    Satisfactory for evaluation; transformation zone component ABSENT.     H/O abnormal pap: yes freezing procedure, maybe 2007/2008 Last mammogram: n/a.  Last colonoscopy: n/a.      03/07/2021    1:52 PM 12/19/2020   11:56 AM 10/16/2020    3:30 PM 10/10/2020    2:21 PM 09/25/2020    2:52 PM  Depression screen PHQ 2/9  Decreased Interest 0 0 0 0 0  Down, Depressed, Hopeless 0 1 0 0 0  PHQ - 2 Score 0 1 0 0 0  Altered sleeping  0 0 0 1  Tired, decreased energy  0 0 0 0  Change in appetite  0 0 0 0  Feeling bad or failure about yourself   0 0 0 0  Trouble concentrating  0 0 0 0  Moving slowly or fidgety/restless  0 0 0 0  Suicidal thoughts  0 0 0 0  PHQ-9 Score  1 0 0 1        03/07/2021    1:52 PM 12/19/2020   11:57 AM 10/16/2020    3:30 PM 10/10/2020    2:21 PM  GAD 7 : Generalized Anxiety Score  Nervous, Anxious, on Edge 0 0 0 0  Control/stop worrying 0 0 0 0  Worry too much - different things 0 0 0 0  Trouble  relaxing 0 0 0 0  Restless 0 0 0 0  Easily annoyed or irritable 0 0 0 0  Afraid - awful might happen 0 0 0 0  Total GAD 7 Score 0 0 0 0  Anxiety Difficulty Not difficult at all        Review of Systems:   Pertinent items are noted in HPI Denies any headaches, blurred vision, fatigue, shortness of breath, chest pain, abdominal pain, abnormal vaginal discharge/itching/odor/irritation, problems with periods, bowel movements, urination, or intercourse unless otherwise stated above. Pertinent History Reviewed:  Reviewed past medical,surgical, social and family history.  Reviewed problem list, medications and allergies. Physical Assessment:   Vitals:   04/23/23 0952  BP: 97/75  Pulse: 90  Weight: 124 lb 14.4 oz (56.7 kg)  Body mass index is 23.6 kg/m.        Physical Examination:   General appearance - well appearing, and in no distress  Mental status - alert, oriented to person, place, and time  Psych:  She has a normal mood and affect  Skin - warm and dry, normal color, no  suspicious lesions noted  Chest - effort normal, all lung fields clear to auscultation bilaterally  Heart - normal rate and regular rhythm  Breasts - breasts appear normal, no suspicious masses, no skin or nipple changes or  axillary nodes on right, left nipple noted to be inverted without underlying masses  Abdomen - soft, nontender, nondistended, no masses or organomegaly  Pelvic -  VULVA: normal appearing vulva with no masses, tenderness or lesions   VAGINA: normal appearing vagina with normal color and discharge, no lesions   CERVIX: normal appearing cervix without discharge or lesions, no CMT  Thin prep pap is done with HR HPV cotesting  UTERUS: uterus is felt to be normal size, shape, consistency and nontender   Nontender levator ani muscles bilaterally  ADNEXA: No adnexal masses or tenderness noted.  Extremities:  No swelling or varicosities noted  Chaperone present for exam  No results found for  this or any previous visit (from the past 24 hour(s)).    Assessment & Plan:  1. Well woman exam with routine gynecological exam - Cervical cancer screening: Discussed guidelines. Pap with HPV completed - GC/CT: declines - Birth Control:  TL - Breast Health: Encouraged self breast awareness/SBE. Teaching provided.  - F/U 12 months and prn Discussed use of of douche with tap water to rinse after menses or vaginal boric acid following menstrual completion to avoid/treat BV that can be caused by old menstrual blood   2. Screening for cervical cancer Pap collected  No orders of the defined types were placed in this encounter.   Meds: No orders of the defined types were placed in this encounter.   Follow-up: Return if symptoms worsen or fail to improve, for Annual GYN.  Lorriane Shire, MD 04/23/2023 9:55 AM

## 2023-04-29 LAB — CYTOLOGY - PAP
Comment: NEGATIVE
Diagnosis: NEGATIVE
Diagnosis: REACTIVE
High risk HPV: NEGATIVE

## 2023-04-30 ENCOUNTER — Other Ambulatory Visit: Payer: Self-pay | Admitting: Obstetrics and Gynecology

## 2023-04-30 DIAGNOSIS — B9689 Other specified bacterial agents as the cause of diseases classified elsewhere: Secondary | ICD-10-CM

## 2023-04-30 MED ORDER — METRONIDAZOLE 0.75 % VA GEL: 1.0000 | Freq: Every day | VAGINAL | 1 refills | Status: DC

## 2023-05-11 ENCOUNTER — Telehealth: Payer: Self-pay | Admitting: *Deleted

## 2023-05-11 NOTE — Telephone Encounter (Signed)
Natasha [name] left a message this am that she was trying to get her prescription, that she was unable to pick it up. I called [name] and she confirmed it was her metrogel and she had called again and someone told her to call the pharmacy . She states she called the pharmacy and they are getting it ready and so she is fine. She thanked me for calling.  [name]

## 2023-05-28 ENCOUNTER — Encounter (HOSPITAL_COMMUNITY): Payer: Self-pay | Admitting: Emergency Medicine

## 2023-05-28 ENCOUNTER — Ambulatory Visit (HOSPITAL_COMMUNITY)
Admission: EM | Admit: 2023-05-28 | Discharge: 2023-05-28 | Disposition: A | Discharge status: Home / Self Care | Payer: Medicaid Other | Attending: Family Medicine | Admitting: Family Medicine

## 2023-05-28 DIAGNOSIS — S0181XA Laceration without foreign body of other part of head, initial encounter: Secondary | ICD-10-CM

## 2023-05-28 MED ORDER — LIDOCAINE-EPINEPHRINE 1 %-1:100000 IJ SOLN
INTRAMUSCULAR | Status: AC
  Filled 2023-05-28: qty 1
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
## 2023-05-28 NOTE — Discharge Instructions (Signed)
Clean the wound 1-2 times daily with soapy water and put new triple antibiotic ointment and a bandage on.   I put 3 stitches in.  Stitches need to be removed in about 5 days. You can return here. Our hours are 8-8 M-F and 10-6 SS. You can also make an appointment online which can lessen your wait time.
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
## 2023-06-03 ENCOUNTER — Ambulatory Visit (HOSPITAL_COMMUNITY): Payer: Self-pay

## 2023-06-05 ENCOUNTER — Ambulatory Visit (HOSPITAL_COMMUNITY)
Admission: RE | Admit: 2023-06-05 | Discharge: 2023-06-05 | Disposition: A | Discharge status: Home / Self Care | Payer: Medicaid Other | Source: Ambulatory Visit

## 2023-06-05 DIAGNOSIS — Z4802 Encounter for removal of sutures: Secondary | ICD-10-CM | POA: Diagnosis not present

## 2023-06-05 NOTE — ED Provider Notes (Signed)
3 total sutures removed without difficulty   Zenia Resides, MD 06/05/23 1120

## 2023-06-05 NOTE — ED Notes (Signed)
Pt arrived for suture removal on forehead. One suture removed and provider called in to triage room to remove the other two.

## 2023-10-03 ENCOUNTER — Other Ambulatory Visit: Payer: Self-pay

## 2023-10-03 ENCOUNTER — Ambulatory Visit (HOSPITAL_COMMUNITY)
Admission: EM | Admit: 2023-10-03 | Discharge: 2023-10-03 | Disposition: A | Discharge status: Home / Self Care | Attending: Physician Assistant | Admitting: Physician Assistant

## 2023-10-03 ENCOUNTER — Encounter (HOSPITAL_COMMUNITY): Payer: Self-pay | Admitting: Emergency Medicine

## 2023-10-03 DIAGNOSIS — Z113 Encounter for screening for infections with a predominantly sexual mode of transmission: Secondary | ICD-10-CM | POA: Diagnosis not present

## 2023-10-03 DIAGNOSIS — I10 Essential (primary) hypertension: Secondary | ICD-10-CM | POA: Diagnosis not present

## 2023-10-03 LAB — HIV ANTIBODY (ROUTINE TESTING W REFLEX): HIV Screen 4th Generation wRfx: NONREACTIVE

## 2023-10-03 MED ORDER — AMLODIPINE BESYLATE 5 MG PO TABS: 5.0000 mg | ORAL_TABLET | Freq: Every day | ORAL | 2 refills | Status: DC

## 2023-10-03 NOTE — ED Provider Notes (Signed)
 [facility]    CSN: [id number] Arrival date & time: 10/03/23  1049      History   Chief Complaint Chief Complaint  Patient presents with   Hypertension    HPI Natasha [name] is a 38 y.o. female.   Patient reports she has a history of hypertension and has been on hypertensive medications in the past.  She reports she has been checking her blood pressure at home over the past few weeks with elevated readings.  Reports higher than 140/90.  Denies chest pain, shortness of breath, headache.  She is also requesting STD testing today.  She reports she has mild discharge, without itching, burning, dysuria, hematuria, abdominal pain, fever, chills.    Past Medical History:  Diagnosis Date   Gestational diabetes    Gestational diabetes mellitus (GDM), antepartum 06/03/2017   [x]  aspirin 06/03/17   History of postpartum depression, currently pregnant 02/21/2011   History of preterm delivery 01/19/2017   3 preterm deliveries Had [facility] 2012 with del at 36 wks Agrees to [facility] this time   Hypertension    Post partum depression 02/21/2011    Patient Active Problem List   Diagnosis Date Noted   Alpha thalassemia silent carrier 06/15/2020   History of four preterm deliveries, currently pregnant 05/03/2020    Past Surgical History:  Procedure Laterality Date   IUD REMOVAL     LAPAROSCOPY  02/06/2011   Procedure: LAPAROSCOPY OPERATIVE;  Surgeon: Tereso Newcomer, MD;  Location: [facility];  Service: Gynecology;  Laterality: N/A;  Removal of Mirena IUD from sigmoid serosa, enterolysis, proctoscopy   PROCTOSCOPY  02/06/2011   Procedure: PROCTOSCOPY;  Surgeon: Ardeth Sportsman, MD;  Location: [facility];  Service: General;  Laterality: N/A;   TUBAL LIGATION Bilateral 10/25/2020   Procedure: POST PARTUM TUBAL LIGATION;  Surgeon: Catalina Antigua, MD;  Location: [facility];  Service: Gynecology;  Laterality: Bilateral;    OB History     Gravida  6   Para  5   Term  0   Preterm  5   AB  1    Living  5      SAB  1   IAB  0   Ectopic  0   Multiple  0   Live Births  5            Home Medications    Prior to Admission medications   Medication Sig Start Date End Date Taking? Authorizing Provider  amLODipine (NORVASC) 5 MG tablet Take 1 tablet (5 mg total) by mouth daily. 10/03/23 11/02/23 Yes Ward, Tylene Fantasia, PA-C  Multiple Vitamin (MULTIVITAMIN) tablet Take 1 tablet by mouth daily.    [provider]    Family History Family History  Problem Relation Age of Onset   Hypertension Paternal Grandmother    Hypertension Paternal Grandfather    Hypertension Maternal Grandmother    Hypertension Maternal Grandfather     Social History Social History   Tobacco Use   Smoking status: Never   Smokeless tobacco: Never  Vaping Use   Vaping status: Never Used  Substance Use Topics   Alcohol use: No   Drug use: No     Allergies   Patient has no known allergies.   Review of Systems Review of Systems  Constitutional:  Negative for chills and fever.  HENT:  Negative for ear pain and sore throat.   Eyes:  Negative for pain and visual disturbance.  Respiratory:  Negative for cough  and shortness of breath.   Cardiovascular:  Negative for chest pain and palpitations.  Gastrointestinal:  Negative for abdominal pain and vomiting.  Genitourinary:  Positive for vaginal discharge. Negative for dysuria and hematuria.  Musculoskeletal:  Negative for arthralgias and back pain.  Skin:  Negative for color change and rash.  Neurological:  Negative for seizures and syncope.  All other systems reviewed and are negative.    Physical Exam Triage Vital Signs ED Triage Vitals [10/03/23 1134]  Encounter Vitals Group     BP (!) 138/96     Systolic BP Percentile      Diastolic BP Percentile      Pulse Rate 85     Resp 18     Temp 98 F (36.7 C)     Temp Source Oral     SpO2 96 %     Weight      Height      Head Circumference      Peak Flow      Pain Score  5     Pain Loc      Pain Education      Exclude from Growth Chart    No data found.  Updated Vital Signs BP (!) 138/96 (BP Location: Right Arm)   Pulse 85   Temp 98 F (36.7 C) (Oral)   Resp 18   LMP 09/26/2023 (Approximate)   SpO2 96%   Visual Acuity Right Eye Distance:   Left Eye Distance:   Bilateral Distance:    Right Eye Near:   Left Eye Near:    Bilateral Near:     Physical Exam Vitals and nursing note reviewed.  Constitutional:      General: She is not in acute distress.    Appearance: She is well-developed.  HENT:     Head: Normocephalic and atraumatic.  Eyes:     Conjunctiva/sclera: Conjunctivae normal.  Cardiovascular:     Rate and Rhythm: Normal rate and regular rhythm.     Heart sounds: No murmur heard. Pulmonary:     Effort: Pulmonary effort is normal. No respiratory distress.     Breath sounds: Normal breath sounds.  Abdominal:     Palpations: Abdomen is soft.     Tenderness: There is no abdominal tenderness.  Musculoskeletal:        General: No swelling.     Cervical back: Neck supple.  Skin:    General: Skin is warm and dry.     Capillary Refill: Capillary refill takes less than 2 seconds.  Neurological:     Mental Status: She is alert.  Psychiatric:        Mood and Affect: Mood normal.      UC Treatments / Results  Labs (all labs ordered are listed, but only abnormal results are displayed) Labs Reviewed  HIV ANTIBODY (ROUTINE TESTING W REFLEX)  CERVICOVAGINAL ANCILLARY ONLY    EKG   Radiology No results found.  Procedures Procedures (including critical care time)  Medications Ordered in UC Medications - No data to display  Initial Impression / Assessment and Plan / UC Course  I have reviewed the triage vital signs and the nursing notes.  Pertinent labs & imaging results that were available during my care of the patient were reviewed by me and considered in my medical decision making (see chart for details).     Will  prescribe amlodipine for hypertension.  Advised to continue checking blood pressure at home.  Advised importance of follow-up with  primary care physician.  Primary care assistance ordered.  Patient asymptomatic at this time. Cervicovaginal self swab in clinic today.  HIV testing done as well.  Will treat if indicated based on testing results. Final Clinical Impressions(s) / UC Diagnoses   Final diagnoses:  Primary hypertension  Screen for STD (sexually transmitted disease)     Discharge Instructions      Can started amlodipine for hypertension.  Please continue to monitor your BP at home.  Recommend follow up with a Primary Care Physician.   Will call with STD testing results and initiate treatment if indicated.     ED Prescriptions     Medication Sig Dispense Auth. Provider   amLODipine (NORVASC) 5 MG tablet Take 1 tablet (5 mg total) by mouth daily. 30 tablet Ward, Tylene Fantasia, PA-C      PDMP not reviewed this encounter.   Ward, Tylene Fantasia, PA-C 10/03/23 1245

## 2023-10-03 NOTE — Discharge Instructions (Signed)
 Can started amlodipine for hypertension.  Please continue to monitor your BP at home.  Recommend follow up with a Primary Care Physician.   Will call with STD testing results and initiate treatment if indicated.

## 2023-10-03 NOTE — ED Triage Notes (Signed)
 Pt here with concern about her BP states she has hx of HTN but not on medication right now. And is requesting to be check for STD.

## 2023-10-05 LAB — CERVICOVAGINAL ANCILLARY ONLY
Bacterial Vaginitis (gardnerella): POSITIVE — AB
Candida Glabrata: NEGATIVE
Candida Vaginitis: NEGATIVE
Chlamydia: NEGATIVE
Comment: NEGATIVE
Comment: NEGATIVE
Comment: NEGATIVE
Comment: NEGATIVE
Comment: NEGATIVE
Comment: NORMAL
Neisseria Gonorrhea: NEGATIVE
Trichomonas: NEGATIVE

## 2023-10-06 ENCOUNTER — Telehealth (HOSPITAL_COMMUNITY): Payer: Self-pay

## 2023-10-06 MED ORDER — METRONIDAZOLE 0.75 % VA GEL: 1.0000 | Freq: Every day | VAGINAL | 0 refills | Status: AC

## 2023-10-06 MED ORDER — METRONIDAZOLE 500 MG PO TABS
500.0000 mg | ORAL_TABLET | Freq: Two times a day (BID) | ORAL | 0 refills | Status: AC
Start: 2023-10-06 — End: 2023-10-13

## 2023-10-06 NOTE — Telephone Encounter (Signed)
 Per protocol, pt requires tx with metronidazole. Rx sent to pharmacy on file.

## 2023-10-06 NOTE — Telephone Encounter (Signed)
 Pt called requesting metrogel over PO. Also requested pharmacy change.

## 2023-10-06 NOTE — Addendum Note (Signed)
 Addended by: Warren Danes on: 10/06/2023 05:00 PM   Modules accepted: Orders

## 2023-10-12 ENCOUNTER — Ambulatory Visit: Admitting: Family

## 2023-11-02 ENCOUNTER — Emergency Department (HOSPITAL_COMMUNITY)
Admission: EM | Admit: 2023-11-02 | Discharge: 2023-11-02 | Discharge status: Against Medical Advice | Attending: Emergency Medicine | Admitting: Emergency Medicine

## 2023-11-02 ENCOUNTER — Ambulatory Visit (HOSPITAL_COMMUNITY): Admission: EM | Admit: 2023-11-02 | Discharge: 2023-11-02 | Disposition: A | Discharge status: Home / Self Care

## 2023-11-02 ENCOUNTER — Emergency Department (HOSPITAL_COMMUNITY)

## 2023-11-02 ENCOUNTER — Encounter (HOSPITAL_COMMUNITY): Payer: Self-pay | Admitting: *Deleted

## 2023-11-02 DIAGNOSIS — R519 Headache, unspecified: Secondary | ICD-10-CM | POA: Insufficient documentation

## 2023-11-02 DIAGNOSIS — R42 Dizziness and giddiness: Secondary | ICD-10-CM

## 2023-11-02 DIAGNOSIS — R7309 Other abnormal glucose: Secondary | ICD-10-CM | POA: Insufficient documentation

## 2023-11-02 DIAGNOSIS — R079 Chest pain, unspecified: Secondary | ICD-10-CM | POA: Diagnosis not present

## 2023-11-02 DIAGNOSIS — R002 Palpitations: Secondary | ICD-10-CM | POA: Insufficient documentation

## 2023-11-02 DIAGNOSIS — I10 Essential (primary) hypertension: Secondary | ICD-10-CM | POA: Insufficient documentation

## 2023-11-02 DIAGNOSIS — Z79899 Other long term (current) drug therapy: Secondary | ICD-10-CM | POA: Diagnosis not present

## 2023-11-02 DIAGNOSIS — R110 Nausea: Secondary | ICD-10-CM | POA: Insufficient documentation

## 2023-11-02 LAB — TROPONIN I (HIGH SENSITIVITY)
Troponin I (High Sensitivity): 2 ng/L (ref <18)
Troponin I (High Sensitivity): <2 ng/L (ref <18)

## 2023-11-02 LAB — BASIC METABOLIC PANEL WITH GFR
Anion gap: 11 (ref 5–15)
BUN: 9 mg/dL (ref 6–20)
CO2: 27 mmol/L (ref 22–32)
Calcium: 10.2 mg/dL (ref 8.9–10.3)
Chloride: 103 mmol/L (ref 98–111)
Creatinine, Ser: 0.66 mg/dL (ref 0.44–1.00)
GFR, Estimated: >60 mL/min (ref >60)
Glucose, Bld: 116 mg/dL — ABNORMAL HIGH (ref 70–99)
Potassium: 4.1 mmol/L (ref 3.5–5.1)
Sodium: 141 mmol/L (ref 135–145)

## 2023-11-02 LAB — CBC
HCT: 41.1 % (ref 36.0–46.0)
Hemoglobin: 12.9 g/dL (ref 12.0–15.0)
MCH: 29.9 pg (ref 26.0–34.0)
MCHC: 31.4 g/dL (ref 30.0–36.0)
MCV: 95.1 fL (ref 80.0–100.0)
Platelets: 335 10*3/uL (ref 150–400)
RBC: 4.32 MIL/uL (ref 3.87–5.11)
RDW: 13.8 % (ref 11.5–15.5)
WBC: 6.5 10*3/uL (ref 4.0–10.5)
nRBC: 0 % (ref 0.0–0.2)

## 2023-11-02 LAB — HCG, SERUM, QUALITATIVE: Preg, Serum: NEGATIVE

## 2023-11-02 MED ORDER — IBUPROFEN 400 MG PO TABS
600.0000 mg | ORAL_TABLET | Freq: Once | ORAL | Status: AC
  Administered 2023-11-02: 600 mg via ORAL
  Filled 2023-11-02: qty 1

## 2023-11-02 MED ORDER — ONDANSETRON 4 MG PO TBDP
4.0000 mg | ORAL_TABLET | Freq: Once | ORAL | Status: AC
  Administered 2023-11-02: 4 mg via ORAL
  Filled 2023-11-02: qty 1

## 2023-11-02 NOTE — ED Notes (Signed)
 Patient is being discharged from the Urgent Care and sent to the Emergency Department via POV . Per Pola Corn, NP, patient is in need of higher level of care due to dizziness. Patient is aware and verbalizes understanding of plan of care.  Vitals:   11/02/23 1009  BP: (!) 140/94  Pulse: 100  Resp: 18  Temp: 98.2 F (36.8 C)  SpO2: 100%

## 2023-11-02 NOTE — ED Triage Notes (Signed)
 Pt states she felt like her BP was high today about 45 mins ago she took her BP meds. She states she is dizzy, she states she is unable to sit upright or stand. Provider called to room

## 2023-11-02 NOTE — ED Triage Notes (Signed)
 Pt referred to ED from UC for palpitations, dizziness, headache, and SOB that started this morning. Pt states she thought it was related to her BP and took her amlodipine.

## 2023-11-02 NOTE — ED Provider Notes (Signed)
 [facility]  Note:  This document was prepared using Dragon voice recognition software and may include unintentional dictation errors.  MRN: [id number] DOB: 1986/07/04  Subjective:   Natasha [name] is a 38 y.o. female presenting for severe dizziness, weakness, headache, palpitations, tachypnea x 45 minutes.  Patient reports that symptoms came on suddenly without any warning symptoms.  Patient denies any past history of palpitations, cardiac issues or anxiety.  Patient has not taken any medication to treat symptoms.  Patient brought to urgent care by her boyfriend for evaluation of sudden onset of symptoms.  No current facility-administered medications for this encounter.  Current Outpatient Medications:    amLODipine (NORVASC) 5 MG tablet, Take 1 tablet (5 mg total) by mouth daily., Disp: 30 tablet, Rfl: 2   Multiple Vitamin (MULTIVITAMIN) tablet, Take 1 tablet by mouth daily., Disp: , Rfl:    No Known Allergies  Past Medical History:  Diagnosis Date   Gestational diabetes    Gestational diabetes mellitus (GDM), antepartum 06/03/2017   [x]  aspirin 06/03/17   History of postpartum depression, currently pregnant 02/21/2011   History of preterm delivery 01/19/2017   3 preterm deliveries Had [facility] 2012 with del at 36 wks Agrees to [facility] this time   Hypertension    Post partum depression 02/21/2011     Past Surgical History:  Procedure Laterality Date   IUD REMOVAL     LAPAROSCOPY  02/06/2011   Procedure: LAPAROSCOPY OPERATIVE;  Surgeon: Tereso Newcomer, MD;  Location: [facility];  Service: Gynecology;  Laterality: N/A;  Removal of Mirena IUD from sigmoid serosa, enterolysis, proctoscopy   PROCTOSCOPY  02/06/2011   Procedure: PROCTOSCOPY;  Surgeon: Ardeth Sportsman, MD;  Location: [facility];  Service: General;  Laterality: N/A;   TUBAL LIGATION Bilateral 10/25/2020   Procedure: POST PARTUM TUBAL LIGATION;  Surgeon: Catalina Antigua, MD;  Location: [facility];  Service: Gynecology;   Laterality: Bilateral;    Family History  Problem Relation Age of Onset   Hypertension Paternal Grandmother    Hypertension Paternal Grandfather    Hypertension Maternal Grandmother    Hypertension Maternal Grandfather     Social History   Tobacco Use   Smoking status: Never   Smokeless tobacco: Never  Vaping Use   Vaping status: Never Used  Substance Use Topics   Alcohol use: No   Drug use: No    ROS Refer to HPI for ROS details.  Objective:   Vitals: BP (!) 140/94 (BP Location: Right Arm)   Pulse 100   Temp 98.2 F (36.8 C) (Oral)   Resp 18   LMP 09/26/2023 (Approximate)   SpO2 100%   Physical Exam Vitals and nursing note reviewed.  Constitutional:      General: She is not in acute distress.    Appearance: Normal appearance. She is well-developed. She is not ill-appearing or toxic-appearing.  HENT:     Head: Normocephalic.  Cardiovascular:     Rate and Rhythm: Regular rhythm. Tachycardia present.     Heart sounds: No murmur heard. Pulmonary:     Effort: Tachypnea present. No respiratory distress.     Breath sounds: Normal breath sounds. No stridor. No wheezing, rhonchi or rales.  Skin:    General: Skin is warm and dry.  Neurological:     General: No focal deficit present.     Mental Status: She is alert and oriented to person, place, and time.  Psychiatric:        Mood and Affect:  Mood normal.        Behavior: Behavior normal.     Procedures  No results found for this or any previous visit (from the past 24 hours).  Assessment and Plan :   PDMP not reviewed this encounter.  1. Dizziness    -With sudden onset of dizziness, headache, weakness and palpitations follow-up in emergency department for further evaluation and management is recommended. -May need evaluation with stat laboratory testing and advanced imaging which is not available in urgent care. -Please go directly to emergency department after leaving urgent care for further diagnostic  evaluation and management of current symptoms. -Continue to monitor symptoms for any change in severity if there is any escalation of current symptoms or development of new symptoms follow-up in ER for further evaluation and management.  [place]boy   [place], [place] B, NP 11/02/23 1022

## 2023-11-02 NOTE — Discharge Instructions (Addendum)
-  With sudden onset of dizziness, headache, weakness and palpitations follow-up in emergency department for further evaluation and management is recommended. -May need evaluation with stat laboratory testing and advanced imaging which is not available in urgent care. -Please go directly to emergency department after leaving urgent care for further diagnostic evaluation and management of current symptoms.

## 2023-11-02 NOTE — ED Provider Notes (Signed)
 [facility] EMERGENCY DEPARTMENT AT [facility] Provider Note   CSN: [id number] Arrival date & time: 11/02/23  1024     History  Chief Complaint  Patient presents with   Dizziness   Palpitations    Modine Oppenheimer Casasola is a 38 y.o. female with a past medical history of gestational diabetes, HTN presents emergency department for evaluation of dizziness, headache, palpitations, nausea, SHOB that started at 10 00 this morning.  She reports that she was working out when all of symptoms started together.  She endorses that the palpitations lasted for a total of 5 minutes and resolved following resting.  She has no medical history of cardiac events.  She is never felt anything like this in the past. She denies chest pain, fevers, hemoptysis, unilateral leg swelling, recent surgery or trauma, prior clot, OCP.  Recently started amlodipine 3 weeks ago for HTN but has not had any adverse effects that she is aware of at this time. Has been compliant with medication. She has a PCP follow-up on Nov 26, 2023  Initially, was evaluated by UC this morning who recommended ED evaluation  Currently, she complains of frontal headache and nausea.  She has no visual disturbances, recent trauma.   Dizziness Associated symptoms: chest pain and palpitations   Associated symptoms: no diarrhea, no headaches, no nausea, no shortness of breath, no vomiting and no weakness   Palpitations Associated symptoms: chest pain and dizziness   Associated symptoms: no cough, no nausea, no numbness, no shortness of breath, no vomiting and no weakness       Home Medications Prior to Admission medications   Medication Sig Start Date End Date Taking? Authorizing Provider  amLODipine (NORVASC) 5 MG tablet Take 1 tablet (5 mg total) by mouth daily. 10/03/23 11/02/23  Ward, Tylene Fantasia, PA-C  Multiple Vitamin (MULTIVITAMIN) tablet Take 1 tablet by mouth daily.    [provider]      Allergies    Patient has no known  allergies.    Review of Systems   Review of Systems  Constitutional:  Negative for chills, fatigue and fever.  Respiratory:  Negative for cough, chest tightness, shortness of breath and wheezing.   Cardiovascular:  Positive for chest pain and palpitations.  Gastrointestinal:  Negative for abdominal pain, constipation, diarrhea, nausea and vomiting.  Neurological:  Positive for dizziness. Negative for seizures, weakness, light-headedness, numbness and headaches.    Physical Exam Updated Vital Signs BP (!) 115/94   Pulse 83   Temp 98.5 F (36.9 C)   Resp 18   LMP 10/27/2023 (Approximate)   SpO2 100%  Physical Exam Vitals and nursing note reviewed.  Constitutional:      General: She is not in acute distress.    Appearance: Normal appearance.  HENT:     Head: Normocephalic and atraumatic.  Eyes:     General: Lids are normal. Vision grossly intact.     Extraocular Movements:     Right eye: Normal extraocular motion and no nystagmus.     Left eye: Normal extraocular motion and no nystagmus.     Conjunctiva/sclera: Conjunctivae normal.  Cardiovascular:     Rate and Rhythm: Normal rate.  Pulmonary:     Effort: Pulmonary effort is normal. No respiratory distress.     Breath sounds: Normal breath sounds.  Chest:     Chest wall: No tenderness.  Skin:    Coloration: Skin is not jaundiced or pale.  Neurological:     General: No  focal deficit present.     Mental Status: She is alert and oriented to person, place, and time. Mental status is at baseline.     Cranial Nerves: No cranial nerve deficit.     Sensory: No sensory deficit.     Motor: No weakness.     Coordination: Coordination normal.     Gait: Gait normal.     Deep Tendon Reflexes: Reflexes normal.     Comments: Following commands appropriately.  Ambulates without difficulty.  Grip strength equal BUE.  Sensation of BUE and BLE intact and equal.  Motor 5/5 BUE and BLE     ED Results / Procedures / Treatments    Labs (all labs ordered are listed, but only abnormal results are displayed) Labs Reviewed  BASIC METABOLIC PANEL WITH GFR - Abnormal; Notable for the following components:      Result Value   Glucose, Bld 116 (*)    All other components within normal limits  CBC  HCG, SERUM, QUALITATIVE  TROPONIN I (HIGH SENSITIVITY)  TROPONIN I (HIGH SENSITIVITY)    EKG EKG Interpretation Date/Time:  Monday November 02 2023 11:44:48 EDT Ventricular Rate:  80 PR Interval:  158 QRS Duration:  88 QT Interval:  390 QTC Calculation: 449 R Axis:   67  Text Interpretation: Normal sinus rhythm Normal ECG No previous ECGs available Confirmed by Elayne Snare ([id number]) on 11/02/2023 12:06:28 PM  Radiology DG Chest 2 View Result Date: 11/02/2023 CLINICAL DATA:  Palpitations. EXAM: CHEST - 2 VIEW COMPARISON:  04/03/2022. FINDINGS: Bilateral lung fields are clear. Bilateral costophrenic angles are clear. Normal cardio-mediastinal silhouette. No acute osseous abnormalities. Old healed right posterior sixth through eighth rib fractures noted. The soft tissues are within normal limits. IMPRESSION: No active cardiopulmonary disease. Electronically Signed   By: Jules Schick M.D.   On: 11/02/2023 11:49    Procedures Procedures    Medications Ordered in ED Medications  ibuprofen (ADVIL) tablet 600 mg (600 mg Oral Given 11/02/23 1234)  ondansetron (ZOFRAN-ODT) disintegrating tablet 4 mg (4 mg Oral Given 11/02/23 1235)    ED Course/ Medical Decision Making/ A&P                                 Medical Decision Making Amount and/or Complexity of Data Reviewed Labs: ordered. Radiology: ordered.  Risk Prescription drug management.   Patient presents to the ED for concern of CP, dizziness, HA, SHOB, this involves an extensive number of treatment options, and is a complaint that carries with it a high risk of complications and morbidity.  The differential diagnosis includes ACS, arrhythmia, infection, PE ,  pneumonia   Co morbidities that complicate the patient evaluation  HTN Recently starting amlodipine 3 wks ago   Additional history obtained:  Additional history obtained from Family, Nursing, and Outside Medical Records   External records from outside source obtained and reviewed including point-of-care bedside, triage RN note, urgent care note from today   Lab Tests:  I Ordered, and personally interpreted labs.  The pertinent results include:   Troponin x 1 negative hCG negative CBC WNL CBG 116   Imaging Studies ordered:  I ordered imaging studies including x-ray I independently visualized and interpreted imaging which showed no acute cardiopulmonary pathology I agree with the radiologist interpretation   Cardiac Monitoring:  The patient was maintained on a cardiac monitor.  I personally viewed and interpreted the cardiac monitored which showed an underlying  rhythm of: NSR with no STE nor ischemia   Medicines ordered and prescription drug management:  I ordered medication including ibuprofen and zofran  for HA, nausea  Reevaluation of the patient after these medicines showed that the patient improved I have reviewed the patients home medicines and have made adjustments as needed   Test Considered:  Dimer, CTA    Problem List / ED Course:  Palpitations Nausea Lasted 5 minutes. No CP ever. Has resolved PTA I am reassured that her first troponin was negative especially as her palpitations have resolved since.  However, prior to obtaining results of second troponin, patient eloped from the emergency department.  So was unable to provide recommendations for follow-up and return to emergency department precautions.  Second troponin was negative so I very low suspicion for ACS CXR WNL Low suspicion for she is PERC negative Dizziness HA Neurologically intact.  Headache resolved with ibuprofen Low suspicion for ICH, central vertigo with reassuring physical  exam Currently no dizziness at physical exam   Reevaluation:  After the interventions noted above, I reevaluated the patient and found that they have :resolved   Social Determinants of Health:  Does not appear that she has a primary care provider-was unable to give recommendation for discharge paperwork   Dispostion:  After consideration of the diagnostic results and the patients response to treatment, I feel that the patent would benefit from outpatient management with primary care with reassuring ED workup.  However, I was unable to provide her with disposition, DC paperwork, recommendations as she eloped prior to obtaining results of second Trop.  Fortunately both are negative ACS risk is very low   Final Clinical Impression(s) / ED Diagnoses Final diagnoses:  Palpitations  Dizziness    Rx / DC Orders ED Discharge Orders     None         Judithann Sheen, PA 11/02/23 1721    Elayne Snare K, DO 11/04/23 [phone]

## 2023-11-26 ENCOUNTER — Encounter: Payer: Self-pay | Admitting: Family

## 2023-11-26 ENCOUNTER — Ambulatory Visit (INDEPENDENT_AMBULATORY_CARE_PROVIDER_SITE_OTHER): Admitting: Family

## 2023-11-26 VITALS — BP 105/70 | HR 65 | Temp 97.7°F | Ht 61.0 in | Wt 147.2 lb

## 2023-11-26 DIAGNOSIS — I10 Essential (primary) hypertension: Secondary | ICD-10-CM | POA: Insufficient documentation

## 2023-11-26 MED ORDER — AMLODIPINE BESYLATE 2.5 MG PO TABS: 2.5000 mg | ORAL_TABLET | Freq: Every day | ORAL | 5 refills | Status: DC

## 2023-11-26 NOTE — Assessment & Plan Note (Signed)
 Hypertension controlled with amlodipine . Blood pressure 105/70 mmHg, within target. No amlodipine  side effects. Heart palpitations improved. Emphasized home monitoring. - Prescribe amlodipine  2.5 mg daily. - Instruct to monitor blood pressure daily for a week and record readings. - Advise to report if blood pressure exceeds 120/80 mmHg. - Schedule follow-up in 1 month for physical exam and fasting labs.

## 2023-11-26 NOTE — Progress Notes (Signed)
 New Patient Office Visit  Subjective:  Patient ID: Natasha [name], female    DOB: [birth year]  Age: 38 y.o. MRN: [id number]  CC:  Chief Complaint  Patient presents with   New Patient (Initial Visit)   Hypertension   HPI Natasha [name] presents for establishing care today.  Discussed the use of AI scribe software for clinical note transcription with the patient, who gave verbal consent to proceed.  History of Present Illness Natasha [name] is a 38 year old female with hypertension who presents with concerns about elevated blood pressure. She is accompanied by her daughter, Natasha [name].  She initially experienced dizziness and numbness in her hands, leading to a visit to urgent care where elevated blood pressure was noted. She was started on amlodipine , which she takes in the morning without experiencing ankle swelling. Heart palpitations have improved. She has not been monitoring her blood pressure at home recently, although she owns an arm cuff. Since starting the medication, she has not felt lightheaded or dizzy and feels okay. No ankle swelling, lightheadedness, or dizziness since starting the medication.  Assessment & Plan Hypertension Hypertension controlled with amlodipine . Blood pressure 105/70 mmHg, within target. No amlodipine  side effects. Heart palpitations improved. Emphasized home monitoring. - Prescribe amlodipine  2.5 mg daily. - Instruct to monitor blood pressure daily for a week and record readings. - Advise to report if blood pressure exceeds 120/80 mmHg. - Schedule follow-up in 1 month for physical exam and fasting labs.   Subjective:    Outpatient Medications Prior to Visit  Medication Sig Dispense Refill   Multiple Vitamin (MULTIVITAMIN) tablet Take 1 tablet by mouth daily.     amLODipine  (NORVASC ) 5 MG tablet Take 1 tablet (5 mg total) by mouth daily. 30 tablet 2   No facility-administered medications prior to visit.   Past Medical History:  Diagnosis Date    Gestational diabetes    Gestational diabetes mellitus (GDM), antepartum 06/03/2017   [x]  aspirin  06/03/17   History of postpartum depression, currently pregnant 02/21/2011   History of preterm delivery 01/19/2017   3 preterm deliveries Had [facility] 2012 with del at 36 wks Agrees to [facility] this time   Hypertension    Post partum depression 02/21/2011   Past Surgical History:  Procedure Laterality Date   IUD REMOVAL     LAPAROSCOPY  02/06/2011   Procedure: LAPAROSCOPY OPERATIVE;  Surgeon: Julianne Octave, MD;  Location: [facility];  Service: Gynecology;  Laterality: N/A;  Removal of Mirena IUD from sigmoid serosa, enterolysis, proctoscopy   PROCTOSCOPY  02/06/2011   Procedure: PROCTOSCOPY;  Surgeon: Eddye Goodie, MD;  Location: [facility];  Service: General;  Laterality: N/A;   TUBAL LIGATION Bilateral 10/25/2020   Procedure: POST PARTUM TUBAL LIGATION;  Surgeon: Verlyn Goad, MD;  Location: [facility];  Service: Gynecology;  Laterality: Bilateral;    Objective:   Today's Vitals: BP 105/70 (BP Location: Left Arm, Patient Position: Sitting, Cuff Size: Large)   Pulse 65   Temp 97.7 F (36.5 C) (Temporal)   Ht 5\' 1"  (1.549 m)   Wt 147 lb 3.2 oz (66.8 kg)   LMP 11/19/2023 (Exact Date)   SpO2 98%   BMI 27.81 kg/m   Physical Exam Vitals and nursing note reviewed.  Constitutional:      Appearance: Normal appearance.  Cardiovascular:     Rate and Rhythm: Normal rate and regular rhythm.  Pulmonary:     Effort: Pulmonary effort is normal.     Breath  sounds: Normal breath sounds.  Musculoskeletal:        General: Normal range of motion.  Skin:    General: Skin is warm and dry.  Neurological:     Mental Status: She is alert.  Psychiatric:        Mood and Affect: Mood normal.        Behavior: Behavior normal.    Meds ordered this encounter  Medications   amLODipine  (NORVASC ) 2.5 MG tablet    Sig: Take 1 tablet (2.5 mg total) by mouth daily.    Dispense:  30 tablet    Refill:  5     Supervising Provider:   ANDY, CAMILLE L [[id number]]    Versa Gore, NP

## 2023-11-26 NOTE — Patient Instructions (Addendum)
 Welcome to [facility], It was a pleasure meeting you today!    As discussed, I have sent your refill to your pharmacy.  Please schedule a 1 month follow up visit today and ok to combine with a physical with fasting labs.    PLEASE NOTE: If you had any LAB tests please let us  know if you have not heard back within a few days. You may see your results on MyChart before we have a chance to review them but we will give you a call once they are reviewed by us . If we ordered any REFERRALS today, please let us  know if you have not heard from their office within the next week.  Let us  know through MyChart if you are needing REFILLS, or have your pharmacy send us  the request. You can also use MyChart to communicate with me or any office staff.  Please try these tips to maintain a healthy lifestyle: It is important that you exercise regularly at least 30 minutes 5 times a week. Think about what you will eat, plan ahead. Choose whole foods, & think  "clean, green, fresh or frozen" over canned, processed or packaged foods which are more sugary, salty, and fatty. 70 to 75% of food eaten should be fresh vegetables and protein. 2-3  meals daily with healthy snacks between meals, but must be whole fruit, protein or vegetables. Aim to eat over a 10 hour period when you are active, for example, 7am to 5pm, and then STOP after your last meal of the day, drinking only water.  Shorter eating windows, 6-8 hours, are showing benefits in heart disease and blood sugar regulation. Drink water every day! Shoot for 64 ounces daily = 8 cups, no other drink is as healthy! Fruit juice is best enjoyed in a healthy way, by EATING the fruit.

## 2023-12-15 ENCOUNTER — Encounter (HOSPITAL_COMMUNITY): Payer: Self-pay

## 2023-12-15 ENCOUNTER — Ambulatory Visit (HOSPITAL_COMMUNITY)
Admission: EM | Admit: 2023-12-15 | Discharge: 2023-12-15 | Disposition: A | Discharge status: Home / Self Care | Attending: Internal Medicine | Admitting: Internal Medicine

## 2023-12-15 DIAGNOSIS — H7291 Unspecified perforation of tympanic membrane, right ear: Secondary | ICD-10-CM

## 2023-12-15 DIAGNOSIS — H9201 Otalgia, right ear: Secondary | ICD-10-CM | POA: Diagnosis not present

## 2023-12-15 MED ORDER — OFLOXACIN 0.3 % OT SOLN: 5.0000 [drp] | Freq: Two times a day (BID) | OTIC | 0 refills | Status: DC

## 2023-12-15 MED ORDER — AMOXICILLIN-POT CLAVULANATE 875-125 MG PO TABS: 1.0000 | ORAL_TABLET | Freq: Two times a day (BID) | ORAL | 0 refills | Status: AC

## 2023-12-15 NOTE — ED Provider Notes (Signed)
 [facility]    CSN: [id number] Arrival date & time: 12/15/23  1928      History   Chief Complaint Chief Complaint  Patient presents with   Otalgia    HPI Natasha [name] is a 38 y.o. female.   38 year old female who presents to urgent care with complaints of right ear pain and bleeding.  She reports that yesterday she stuck a Bobby pin in her ear and immediately got significant pain.  She has not had any loss of hearing.  She has had some drainage from the ear since then.  She denies any fevers, chills, sore throat, difficulty swallowing, loss of hearing.  She has never had any ear problems in the past.   Otalgia   Past Medical History:  Diagnosis Date   Gestational diabetes    Gestational diabetes mellitus (GDM), antepartum 06/03/2017   [x]  aspirin  06/03/17   History of postpartum depression, currently pregnant 02/21/2011   History of preterm delivery 01/19/2017   3 preterm deliveries Had [facility] 2012 with del at 36 wks Agrees to [facility] this time   Hypertension    Post partum depression 02/21/2011    Patient Active Problem List   Diagnosis Date Noted   Essential (primary) hypertension 11/26/2023   Alpha thalassemia silent carrier 06/15/2020   History of four preterm deliveries, currently pregnant 05/03/2020    Past Surgical History:  Procedure Laterality Date   IUD REMOVAL     LAPAROSCOPY  02/06/2011   Procedure: LAPAROSCOPY OPERATIVE;  Surgeon: Julianne Octave, MD;  Location: [facility];  Service: Gynecology;  Laterality: N/A;  Removal of Mirena IUD from sigmoid serosa, enterolysis, proctoscopy   PROCTOSCOPY  02/06/2011   Procedure: PROCTOSCOPY;  Surgeon: Eddye Goodie, MD;  Location: [facility];  Service: General;  Laterality: N/A;   TUBAL LIGATION Bilateral 10/25/2020   Procedure: POST PARTUM TUBAL LIGATION;  Surgeon: Verlyn Goad, MD;  Location: [facility];  Service: Gynecology;  Laterality: Bilateral;    OB History     Gravida  6   Para  5   Term  0    Preterm  5   AB  1   Living  5      SAB  1   IAB  0   Ectopic  0   Multiple  0   Live Births  5            Home Medications    Prior to Admission medications   Medication Sig Start Date End Date Taking? Authorizing Provider  amoxicillin -clavulanate (AUGMENTIN ) 875-125 MG tablet Take 1 tablet by mouth every 12 (twelve) hours for 10 days. 12/15/23 12/25/23 Yes Sanders Manninen A, PA-C  ofloxacin (FLOXIN) 0.3 % OTIC solution Place 5 drops into the right ear 2 (two) times daily. 12/15/23  Yes Ewan Grau A, PA-C  amLODipine  (NORVASC ) 2.5 MG tablet Take 1 tablet (2.5 mg total) by mouth daily. 11/26/23 12/26/23  Versa Gore, NP  Multiple Vitamin (MULTIVITAMIN) tablet Take 1 tablet by mouth daily.    [provider]    Family History Family History  Problem Relation Age of Onset   Hypertension Paternal Grandmother    Hypertension Paternal Grandfather    Hypertension Maternal Grandmother    Hypertension Maternal Grandfather     Social History Social History   Tobacco Use   Smoking status: Never   Smokeless tobacco: Never  Vaping Use   Vaping status: Never Used  Substance Use Topics   Alcohol use:  No   Drug use: No     Allergies   Patient has no known allergies.   Review of Systems Review of Systems  HENT:  Positive for ear pain.      Physical Exam Triage Vital Signs ED Triage Vitals  Encounter Vitals Group     BP 12/15/23 2022 98/66     Systolic BP Percentile --      Diastolic BP Percentile --      Pulse Rate 12/15/23 2022 78     Resp 12/15/23 2022 16     Temp 12/15/23 2022 98.7 F (37.1 C)     Temp Source 12/15/23 2022 Oral     SpO2 12/15/23 2022 97 %     Weight --      Height --      Head Circumference --      Peak Flow --      Pain Score 12/15/23 2021 5     Pain Loc --      Pain Education --      Exclude from Growth Chart --    No data found.  Updated Vital Signs BP 98/66 (BP Location: Right Arm)   Pulse 78   Temp  98.7 F (37.1 C) (Oral)   Resp 16   LMP 12/14/2023 (Exact Date)   SpO2 97%   Visual Acuity Right Eye Distance:   Left Eye Distance:   Bilateral Distance:    Right Eye Near:   Left Eye Near:    Bilateral Near:     Physical Exam Vitals and nursing note reviewed.  Constitutional:      General: She is not in acute distress.    Appearance: She is well-developed.  HENT:     Head: Normocephalic and atraumatic.     Right Ear: Hearing normal. Tenderness present. No drainage. Tympanic membrane is injected and perforated.     Left Ear: Hearing and tympanic membrane normal.     Mouth/Throat:     Mouth: Mucous membranes are moist.  Eyes:     Conjunctiva/sclera: Conjunctivae normal.  Cardiovascular:     Rate and Rhythm: Normal rate.  Pulmonary:     Effort: Pulmonary effort is normal. No respiratory distress.  Musculoskeletal:        General: No swelling.     Cervical back: Neck supple.  Skin:    General: Skin is warm and dry.     Capillary Refill: Capillary refill takes less than 2 seconds.  Neurological:     Mental Status: She is alert.  Psychiatric:        Mood and Affect: Mood normal.      UC Treatments / Results  Labs (all labs ordered are listed, but only abnormal results are displayed) Labs Reviewed - No data to display  EKG   Radiology No results found.  Procedures Procedures (including critical care time)  Medications Ordered in UC Medications - No data to display  Initial Impression / Assessment and Plan / UC Course  I have reviewed the triage vital signs and the nursing notes.  Pertinent labs & imaging results that were available during my care of the patient were reviewed by me and considered in my medical decision making (see chart for details).     Rupture of right tympanic membrane  Right ear pain   The right tympanic membrane is ruptured from trauma of inserting a pin in the ear.  The patient's hearing is intact which is reassuring.  We will  need  to cover this with antibiotics and precautions with showering.  We will treat with the following: Augmentin  875 mg twice daily for 10 days.  Ofloxacin 5 drops in the right ear 2 times daily for 10 days.  When taking a shower keep a cottonball in the ear to keep water from entering the ear canal.  Do this for the first 3 to 4 days.  If you continue to see any drainage then continue this until there is no drainage. If you continue to have persistent pain, drainage or you lose hearing then recommend following up with primary care or an ear nose and throat specialist.  Final Clinical Impressions(s) / UC Diagnoses   Final diagnoses:  Rupture of right tympanic membrane  Right ear pain   Discharge Instructions      The right tympanic membrane (eardrum) is ruptured from trauma.  Your hearing is intact which is good and reassuring.  We will need to cover this with antibiotics and precautions with showering.  We will treat with the following: Augmentin  875 mg twice daily for 10 days.  This is an antibiotic.  Take this with food. Ofloxacin 5 drops in the right ear 2 times daily for 10 days.  When taking a shower keep a cottonball in the ear to keep water from entering the ear canal.  Do this for the first 3 to 4 days.  If you continue to see any drainage then continue this until there is no drainage. If you continue to have persistent pain, drainage or you lose hearing then recommend following up with primary care or an ear nose and throat specialist.  ED Prescriptions     Medication Sig Dispense Auth. Provider   ofloxacin (FLOXIN) 0.3 % OTIC solution Place 5 drops into the right ear 2 (two) times daily. 5 mL Cliffie Gingras A, PA-C   amoxicillin -clavulanate (AUGMENTIN ) 875-125 MG tablet Take 1 tablet by mouth every 12 (twelve) hours for 10 days. 20 tablet Kreg Pesa, [place]      PDMP not reviewed this encounter.   Acie Acosta 12/15/23 2112

## 2023-12-15 NOTE — ED Triage Notes (Signed)
 Pt states right ear pain.  States she stuck a bobbi pin in it and it started bleeding.

## 2023-12-15 NOTE — Discharge Instructions (Addendum)
 The right tympanic membrane (eardrum) is ruptured from trauma.  Your hearing is intact which is good and reassuring.  We will need to cover this with antibiotics and precautions with showering.  We will treat with the following: Augmentin  875 mg twice daily for 10 days.  This is an antibiotic.  Take this with food. Ofloxacin 5 drops in the right ear 2 times daily for 10 days.  When taking a shower keep a cottonball in the ear to keep water from entering the ear canal.  Do this for the first 3 to 4 days.  If you continue to see any drainage then continue this until there is no drainage. If you continue to have persistent pain, drainage or you lose hearing then recommend following up with primary care or an ear nose and throat specialist.

## 2023-12-16 ENCOUNTER — Ambulatory Visit (HOSPITAL_COMMUNITY)

## 2023-12-24 ENCOUNTER — Encounter: Admitting: Family

## 2023-12-26 ENCOUNTER — Encounter (HOSPITAL_COMMUNITY): Payer: Self-pay

## 2023-12-26 ENCOUNTER — Ambulatory Visit (HOSPITAL_COMMUNITY)
Admission: EM | Admit: 2023-12-26 | Discharge: 2023-12-26 | Disposition: A | Discharge status: Home / Self Care | Attending: Physician Assistant | Admitting: Physician Assistant

## 2023-12-26 DIAGNOSIS — B3731 Acute candidiasis of vulva and vagina: Secondary | ICD-10-CM | POA: Insufficient documentation

## 2023-12-26 MED ORDER — FLUCONAZOLE 150 MG PO TABS: 150.0000 mg | ORAL_TABLET | Freq: Every day | ORAL | 0 refills | Status: DC

## 2023-12-26 NOTE — ED Provider Notes (Signed)
 [facility]    CSN: [id number] Arrival date & time: 12/26/23  1558      History   Chief Complaint Chief Complaint  Patient presents with   Vaginal Discharge    HPI Natasha [name] is a 38 y.o. female.   Patient presents with vaginal itching that started about 2 days ago.  Patient reports she just finished an antibiotic.  She denies pelvic pain, fever, chills.  She has tried nothing over-the-counter.    Past Medical History:  Diagnosis Date   Gestational diabetes    Gestational diabetes mellitus (GDM), antepartum 06/03/2017   [x]  aspirin  06/03/17   History of postpartum depression, currently pregnant 02/21/2011   History of preterm delivery 01/19/2017   3 preterm deliveries Had [facility] 2012 with del at 36 wks Agrees to [facility] this time   Hypertension    Post partum depression 02/21/2011    Patient Active Problem List   Diagnosis Date Noted   Essential (primary) hypertension 11/26/2023   Alpha thalassemia silent carrier 06/15/2020   History of four preterm deliveries, currently pregnant 05/03/2020    Past Surgical History:  Procedure Laterality Date   IUD REMOVAL     LAPAROSCOPY  02/06/2011   Procedure: LAPAROSCOPY OPERATIVE;  Surgeon: Julianne Octave, MD;  Location: [facility];  Service: Gynecology;  Laterality: N/A;  Removal of Mirena IUD from sigmoid serosa, enterolysis, proctoscopy   PROCTOSCOPY  02/06/2011   Procedure: PROCTOSCOPY;  Surgeon: Eddye Goodie, MD;  Location: [facility];  Service: General;  Laterality: N/A;   TUBAL LIGATION Bilateral 10/25/2020   Procedure: POST PARTUM TUBAL LIGATION;  Surgeon: Verlyn Goad, MD;  Location: [facility];  Service: Gynecology;  Laterality: Bilateral;    OB History     Gravida  6   Para  5   Term  0   Preterm  5   AB  1   Living  5      SAB  1   IAB  0   Ectopic  0   Multiple  0   Live Births  5            Home Medications    Prior to Admission medications   Medication Sig Start Date End  Date Taking? Authorizing Provider  amLODipine  (NORVASC ) 2.5 MG tablet Take 1 tablet (2.5 mg total) by mouth daily. 11/26/23 12/26/23 Yes Versa Gore, NP  fluconazole  (DIFLUCAN ) 150 MG tablet Take 1 tablet (150 mg total) by mouth daily. 12/26/23  Yes Ward, Verena Shawgo Z, PA-C  Multiple Vitamin (MULTIVITAMIN) tablet Take 1 tablet by mouth daily.    [provider]  ofloxacin  (FLOXIN ) 0.3 % OTIC solution Place 5 drops into the right ear 2 (two) times daily. 12/15/23   Kreg Pesa, PA-C    Family History Family History  Problem Relation Age of Onset   Hypertension Paternal Grandmother    Hypertension Paternal Grandfather    Hypertension Maternal Grandmother    Hypertension Maternal Grandfather     Social History Social History   Tobacco Use   Smoking status: Never   Smokeless tobacco: Never  Vaping Use   Vaping status: Never Used  Substance Use Topics   Alcohol use: No   Drug use: No     Allergies   Patient has no known allergies.   Review of Systems Review of Systems  Constitutional:  Negative for chills and fever.  HENT:  Negative for ear pain and sore throat.   Eyes:  Negative  for pain and visual disturbance.  Respiratory:  Negative for cough and shortness of breath.   Cardiovascular:  Negative for chest pain and palpitations.  Gastrointestinal:  Negative for abdominal pain and vomiting.  Genitourinary:  Negative for dysuria and hematuria.  Musculoskeletal:  Negative for arthralgias and back pain.  Skin:  Negative for color change and rash.  Neurological:  Negative for seizures and syncope.  All other systems reviewed and are negative.    Physical Exam Triage Vital Signs ED Triage Vitals  Encounter Vitals Group     BP 12/26/23 1618 118/82     Systolic BP Percentile --      Diastolic BP Percentile --      Pulse Rate 12/26/23 1618 93     Resp 12/26/23 1618 18     Temp 12/26/23 1618 98.3 F (36.8 C)     Temp Source 12/26/23 1618 Oral     SpO2  12/26/23 1618 97 %     Weight --      Height --      Head Circumference --      Peak Flow --      Pain Score 12/26/23 1616 0     Pain Loc --      Pain Education --      Exclude from Growth Chart --    No data found.  Updated Vital Signs BP 118/82 (BP Location: Left Arm)   Pulse 93   Temp 98.3 F (36.8 C) (Oral)   Resp 18   LMP 12/14/2023 (Exact Date)   SpO2 97%   Visual Acuity Right Eye Distance:   Left Eye Distance:   Bilateral Distance:    Right Eye Near:   Left Eye Near:    Bilateral Near:     Physical Exam Vitals and nursing note reviewed.  Constitutional:      General: She is not in acute distress.    Appearance: She is well-developed.  HENT:     Head: Normocephalic and atraumatic.  Eyes:     Conjunctiva/sclera: Conjunctivae normal.  Cardiovascular:     Rate and Rhythm: Normal rate and regular rhythm.     Heart sounds: No murmur heard. Pulmonary:     Effort: Pulmonary effort is normal. No respiratory distress.     Breath sounds: Normal breath sounds.  Abdominal:     Palpations: Abdomen is soft.     Tenderness: There is no abdominal tenderness.  Musculoskeletal:        General: No swelling.     Cervical back: Neck supple.  Skin:    General: Skin is warm and dry.     Capillary Refill: Capillary refill takes less than 2 seconds.  Neurological:     Mental Status: She is alert.  Psychiatric:        Mood and Affect: Mood normal.      UC Treatments / Results  Labs (all labs ordered are listed, but only abnormal results are displayed) Labs Reviewed  CERVICOVAGINAL ANCILLARY ONLY    EKG   Radiology No results found.  Procedures Procedures (including critical care time)  Medications Ordered in UC Medications - No data to display  Initial Impression / Assessment and Plan / UC Course  I have reviewed the triage vital signs and the nursing notes.  Pertinent labs & imaging results that were available during my care of the patient were  reviewed by me and considered in my medical decision making (see chart for details).  Will treat for vaginal yeast.  Diflucan  prescribed.  Cervicovaginal self swab in clinic today.  Will change treatment plan if indicated based on results. Final Clinical Impressions(s) / UC Diagnoses   Final diagnoses:  Yeast vaginitis     Discharge Instructions      Take Diflucan  as prescribed. Test results pending will change treatment plan if indicated based on results.   ED Prescriptions     Medication Sig Dispense Auth. Provider   fluconazole  (DIFLUCAN ) 150 MG tablet Take 1 tablet (150 mg total) by mouth daily. 1 tablet Ward, Char Common, PA-C      PDMP not reviewed this encounter.   Ward, Char Common, PA-C 12/26/23 [phone]

## 2023-12-26 NOTE — Discharge Instructions (Addendum)
 Take Diflucan  as prescribed. Test results pending will change treatment plan if indicated based on results.

## 2023-12-26 NOTE — ED Triage Notes (Signed)
 Pt has concern for yeast infection following 10-day course of Augmentin . Pt reports discharge, itching, and rash. No concern for STD's.

## 2023-12-28 LAB — CERVICOVAGINAL ANCILLARY ONLY
Bacterial Vaginitis (gardnerella): POSITIVE — AB
Candida Glabrata: NEGATIVE
Candida Vaginitis: POSITIVE — AB
Chlamydia: NEGATIVE
Comment: NEGATIVE
Comment: NEGATIVE
Comment: NEGATIVE
Comment: NEGATIVE
Comment: NEGATIVE
Comment: NORMAL
Neisseria Gonorrhea: NEGATIVE
Trichomonas: NEGATIVE

## 2023-12-29 ENCOUNTER — Ambulatory Visit (HOSPITAL_COMMUNITY): Payer: Self-pay

## 2023-12-29 MED ORDER — METRONIDAZOLE 500 MG PO TABS: 500.0000 mg | ORAL_TABLET | Freq: Two times a day (BID) | ORAL | 0 refills | Status: DC

## 2023-12-29 MED ORDER — METRONIDAZOLE 0.75 % VA GEL
1.0000 | Freq: Every day | VAGINAL | 0 refills | Status: AC
Start: 2023-12-29 — End: 2024-01-03

## 2024-02-02 ENCOUNTER — Encounter (HOSPITAL_COMMUNITY): Payer: Self-pay

## 2024-02-02 ENCOUNTER — Ambulatory Visit (HOSPITAL_COMMUNITY)
Admission: RE | Admit: 2024-02-02 | Discharge: 2024-02-02 | Disposition: A | Discharge status: Home / Self Care | Source: Ambulatory Visit | Attending: Family Medicine | Admitting: Family Medicine

## 2024-02-02 VITALS — BP 123/85 | HR 86 | Temp 98.7°F | Resp 14

## 2024-02-02 DIAGNOSIS — H66002 Acute suppurative otitis media without spontaneous rupture of ear drum, left ear: Secondary | ICD-10-CM

## 2024-02-02 MED ORDER — AMOXICILLIN-POT CLAVULANATE 875-125 MG PO TABS: 1.0000 | ORAL_TABLET | Freq: Two times a day (BID) | ORAL | 0 refills | Status: DC

## 2024-02-02 MED ORDER — FLUCONAZOLE 150 MG PO TABS: ORAL_TABLET | ORAL | 0 refills | Status: DC

## 2024-02-02 NOTE — ED Triage Notes (Signed)
 Patient reports that she began having clear left ear drainage from her left ear last week and is now having left ear pain that started last night. Patient reports that her ear has ruptured in 2021.  Patient states she has been using left over ear drops from a prescription that she had in May for her right ear.

## 2024-02-02 NOTE — ED Provider Notes (Signed)
 [facility]   [id number] 02/02/24 Arrival Time: 1326  ASSESSMENT & PLAN:  1. Non-recurrent acute suppurative otitis media of left ear without spontaneous rupture of tympanic membrane    Begin: Meds ordered this encounter  Medications   amoxicillin -clavulanate (AUGMENTIN ) 875-125 MG tablet    Sig: Take 1 tablet by mouth every 12 (twelve) hours.    Dispense:  14 tablet    Refill:  0   fluconazole  (DIFLUCAN ) 150 MG tablet    Sig: Take one tablet by mouth as a single dose. May repeat in 3 days if symptoms persist.    Dispense:  2 tablet    Refill:  0  Fluconazole  at pt request. May f/u with PCP or here as needed.  Reviewed expectations re: course of current medical issues. Questions answered. Outlined signs and symptoms indicating need for more acute intervention. Patient verbalized understanding. After Visit Summary given.   SUBJECTIVE: History from: patient.  Natasha [name] is a 38 y.o. female who presents with complaint of L otalgia and watery drainage. Patient reports that she began having clear left ear drainage from her left ear last week and is now having left ear pain that started last night. Patient reports that her ear has ruptured in 2021.  Patient states she has been using left over ear drops from a prescription that she had in May for her right ear. Ear feels clogged.  Social History   Tobacco Use  Smoking Status Never  Smokeless Tobacco Never     OBJECTIVE:  Vitals:   02/02/24 1345  BP: 123/85  Pulse: 86  Resp: 14  Temp: 98.7 F (37.1 C)  TempSrc: Oral  SpO2: 94%    General appearance: alert; appears fatigued Ear Canal: normal TM: left: erythematous, retracted, bulging Neck: supple without LAD Lungs: unlabored respirations, symmetrical air entry; cough: absent; no respiratory distress Skin: warm and dry Psychological: alert and cooperative; normal mood and affect  No Known Allergies  Past Medical History:  Diagnosis Date    Gestational diabetes    Gestational diabetes mellitus (GDM), antepartum 06/03/2017   [x]  aspirin  06/03/17   History of postpartum depression, currently pregnant 02/21/2011   History of preterm delivery 01/19/2017   3 preterm deliveries Had [facility] 2012 with del at 36 wks Agrees to [facility] this time   Hypertension    Post partum depression 02/21/2011   Family History  Problem Relation Age of Onset   Hypertension Maternal Grandmother    Hypertension Maternal Grandfather    Hypertension Paternal Grandmother    Hypertension Paternal Grandfather    Social History   Socioeconomic History   Marital status: Single    Spouse name: Not on file   Number of children: Not on file   Years of education: Not on file   Highest education level: GED or equivalent  Occupational History   Not on file  Tobacco Use   Smoking status: Never   Smokeless tobacco: Never  Vaping Use   Vaping status: Never Used  Substance and Sexual Activity   Alcohol use: No   Drug use: No   Sexual activity: Yes    Birth control/protection: Surgical, None  Other Topics Concern   Not on file  Social History Narrative   Not on file   Social Drivers of Health   Financial Resource Strain: Low Risk  (11/22/2023)   Overall Financial Resource Strain (CARDIA)    Difficulty of Paying Living Expenses: Not very hard  Food Insecurity: No Food Insecurity (11/22/2023)  Hunger Vital Sign    Worried About Running Out of Food in the Last Year: Never true    Ran Out of Food in the Last Year: Never true  Transportation Needs: No Transportation Needs (11/22/2023)   PRAPARE - Administrator, Civil Service (Medical): No    Lack of Transportation (Non-Medical): No  Physical Activity: Insufficiently Active (11/22/2023)   Exercise Vital Sign    Days of Exercise per Week: 2 days    Minutes of Exercise per Session: 10 min  Stress: No Stress Concern Present (11/22/2023)   [employer] of Occupational Health - Occupational  Stress Questionnaire    Feeling of Stress : Not at all  Social Connections: Unknown (11/22/2023)   Social Connection and Isolation Panel    Frequency of Communication with Friends and Family: More than three times a week    Frequency of Social Gatherings with Friends and Family: Three times a week    Attends Religious Services: More than 4 times per year    Active Member of Clubs or Organizations: No    Attends Banker Meetings: Not on file    Marital Status: Patient declined  Intimate Partner Violence: Unknown (11/01/2021)   Received from [facility]   HITS    Physically Hurt: Not on file    Insult or Talk Down To: Not on file    Threaten Physical Harm: Not on file    Scream or Curse: Not on file             Rolinda Rogue, MD 02/02/24 [phone]

## 2024-03-08 DIAGNOSIS — F4310 Post-traumatic stress disorder, unspecified: Secondary | ICD-10-CM | POA: Diagnosis not present

## 2024-03-12 ENCOUNTER — Encounter (HOSPITAL_COMMUNITY): Payer: Self-pay

## 2024-03-12 ENCOUNTER — Ambulatory Visit (HOSPITAL_COMMUNITY)
Admission: EM | Admit: 2024-03-12 | Discharge: 2024-03-12 | Disposition: A | Discharge status: Home / Self Care | Attending: Emergency Medicine | Admitting: Emergency Medicine

## 2024-03-12 DIAGNOSIS — R21 Rash and other nonspecific skin eruption: Secondary | ICD-10-CM | POA: Diagnosis not present

## 2024-03-12 DIAGNOSIS — T63461A Toxic effect of venom of wasps, accidental (unintentional), initial encounter: Secondary | ICD-10-CM | POA: Diagnosis not present

## 2024-03-12 DIAGNOSIS — R609 Edema, unspecified: Secondary | ICD-10-CM | POA: Diagnosis not present

## 2024-03-12 MED ORDER — DEXAMETHASONE SODIUM PHOSPHATE 10 MG/ML IJ SOLN
10.0000 mg | Freq: Once | INTRAMUSCULAR | Status: AC
  Administered 2024-03-12: 10 mg via INTRAMUSCULAR

## 2024-03-12 MED ORDER — DEXAMETHASONE SODIUM PHOSPHATE 10 MG/ML IJ SOLN
INTRAMUSCULAR | Status: AC
  Filled 2024-03-12: qty 1

## 2024-03-12 MED ORDER — TRIAMCINOLONE ACETONIDE 0.1 % EX CREA
1.0000 | TOPICAL_CREAM | Freq: Two times a day (BID) | CUTANEOUS | 0 refills | Status: DC
Start: 2024-03-12 — End: 2024-04-26

## 2024-03-12 NOTE — ED Triage Notes (Signed)
 Presenting with rash and multiple insect bites from a wasp onset today around 3:45 pm. States has never been stung before so unsure if allergic.  Has not taken anything for her symptoms.

## 2024-03-12 NOTE — Discharge Instructions (Addendum)
 You received a steroid injection in clinic today to help with your reaction to the wasp sting. You can apply triamcinolone cream twice daily to the area to help reduce inflammation and rash. You can take cetirizine (Zyrtec) once daily to help with itching and swelling. You can also take 25 mg of Benadryl every 6 hours as needed for itching and swelling to the area.  This can make you drowsy so do not drive, work, or drink alcohol while taking this. If you develop worsening swelling, full-body hives, tongue swelling, throat swelling, or trouble breathing please seek immediate medical treatment in the emergency department.

## 2024-03-12 NOTE — ED Provider Notes (Signed)
 [facility]    CSN: [id number] Arrival date & time: 03/12/24  1615      History   Chief Complaint Chief Complaint  Patient presents with   Insect Bite    HPI Natasha [name] is a 38 y.o. female.   Patient presents with rash and swelling to her right buttock after being stung by a wasp today around 3:45 PM.  Patient denies any known allergy to wasp sting.  Patient denies taking any medication for her symptoms.  Patient states that it seems like the swelling on her buttock is increased.  Denies tongue or throat swelling.  Denies trouble breathing or full-body hives.  The history is provided by the patient and medical records.    Past Medical History:  Diagnosis Date   Gestational diabetes    Gestational diabetes mellitus (GDM), antepartum 06/03/2017   [x]  aspirin 06/03/17   History of postpartum depression, currently pregnant 02/21/2011   History of preterm delivery 01/19/2017   3 preterm deliveries Had [facility] 2012 with del at 36 wks Agrees to [facility] this time   Hypertension    Post partum depression 02/21/2011    Patient Active Problem List   Diagnosis Date Noted   Essential (primary) hypertension 11/26/2023   Alpha thalassemia silent carrier 06/15/2020   History of four preterm deliveries, currently pregnant 05/03/2020    Past Surgical History:  Procedure Laterality Date   IUD REMOVAL     LAPAROSCOPY  02/06/2011   Procedure: LAPAROSCOPY OPERATIVE;  Surgeon: Gloris DELENA Hugger, MD;  Location: [facility];  Service: Gynecology;  Laterality: N/A;  Removal of Mirena IUD from sigmoid serosa, enterolysis, proctoscopy   PROCTOSCOPY  02/06/2011   Procedure: PROCTOSCOPY;  Surgeon: Elspeth KYM Schultze, MD;  Location: [facility];  Service: General;  Laterality: N/A;   TUBAL LIGATION Bilateral 10/25/2020   Procedure: POST PARTUM TUBAL LIGATION;  Surgeon: Alger Gong, MD;  Location: [facility];  Service: Gynecology;  Laterality: Bilateral;    OB History     Gravida  6   Para  5    Term  0   Preterm  5   AB  1   Living  5      SAB  1   IAB  0   Ectopic  0   Multiple  0   Live Births  5            Home Medications    Prior to Admission medications   Medication Sig Start Date End Date Taking? Authorizing Provider  amLODipine (NORVASC) 2.5 MG tablet Take 1 tablet (2.5 mg total) by mouth daily. 11/26/23 03/12/24 Yes Lucius Krabbe, NP  triamcinolone cream (KENALOG) 0.1 % Apply 1 Application topically 2 (two) times daily. 03/12/24  Yes Johnie Rumaldo DELENA, NP    Family History Family History  Problem Relation Age of Onset   Hypertension Maternal Grandmother    Hypertension Maternal Grandfather    Hypertension Paternal Grandmother    Hypertension Paternal Grandfather     Social History Social History   Tobacco Use   Smoking status: Never   Smokeless tobacco: Never  Vaping Use   Vaping status: Never Used  Substance Use Topics   Alcohol use: No   Drug use: No     Allergies   Patient has no known allergies.   Review of Systems Review of Systems  Per HPI  Physical Exam Triage Vital Signs ED Triage Vitals  Encounter Vitals Group     BP 03/12/24  1728 126/82     Girls Systolic BP Percentile --      Girls Diastolic BP Percentile --      Boys Systolic BP Percentile --      Boys Diastolic BP Percentile --      Pulse Rate 03/12/24 1728 71     Resp 03/12/24 1728 18     Temp 03/12/24 1728 98.2 F (36.8 C)     Temp Source 03/12/24 1728 Oral     SpO2 03/12/24 1728 98 %     Weight 03/12/24 1728 151 lb (68.5 kg)     Height 03/12/24 1728 5' 1 (1.549 m)     Head Circumference --      Peak Flow --      Pain Score 03/12/24 1726 5     Pain Loc --      Pain Education --      Exclude from Growth Chart --    No data found.  Updated Vital Signs BP 126/82 (BP Location: Right Arm)   Pulse 71   Temp 98.2 F (36.8 C) (Oral)   Resp 18   Ht 5' 1 (1.549 m)   Wt 151 lb (68.5 kg)   LMP 02/27/2024 (Exact Date)   SpO2 98%   BMI 28.53  kg/m   Visual Acuity Right Eye Distance:   Left Eye Distance:   Bilateral Distance:    Right Eye Near:   Left Eye Near:    Bilateral Near:     Physical Exam Vitals and nursing note reviewed.  Constitutional:      General: She is awake. She is not in acute distress.    Appearance: Normal appearance. She is well-developed and well-groomed. She is not ill-appearing.  Skin:    General: Skin is warm and dry.     Findings: Erythema and rash present.     Comments: Approximately 4 cm in diameter area of swelling noted to the right buttock with erythema and rash present over this.  Neurological:     Mental Status: She is alert.  Psychiatric:        Behavior: Behavior is cooperative.      UC Treatments / Results  Labs (all labs ordered are listed, but only abnormal results are displayed) Labs Reviewed - No data to display  EKG   Radiology No results found.  Procedures Procedures (including critical care time)  Medications Ordered in UC Medications  dexamethasone (DECADRON) injection 10 mg (10 mg Intramuscular Given 03/12/24 1759)    Initial Impression / Assessment and Plan / UC Course  I have reviewed the triage vital signs and the nursing notes.  Pertinent labs & imaging results that were available during my care of the patient were reviewed by me and considered in my medical decision making (see chart for details).     Patient is overall well-appearing.  Vitals are stable.  Given IM Decadron in clinic for reaction related to wasp sting.  Prescribed triamcinolone cream for additional relief of this.  Discussed over-the-counter antihistamine use.  Discussed follow-up, return, and strict ER precautions. Final Clinical Impressions(s) / UC Diagnoses   Final diagnoses:  Wasp sting, accidental or unintentional, initial encounter  Rash  Swelling     Discharge Instructions      You received a steroid injection in clinic today to help with your reaction to the wasp  sting. You can apply triamcinolone cream twice daily to the area to help reduce inflammation and rash. You can take cetirizine (Zyrtec)  once daily to help with itching and swelling. You can also take 25 mg of Benadryl every 6 hours as needed for itching and swelling to the area.  This can make you drowsy so do not drive, work, or drink alcohol while taking this. If you develop worsening swelling, full-body hives, tongue swelling, throat swelling, or trouble breathing please seek immediate medical treatment in the emergency department.   ED Prescriptions     Medication Sig Dispense Auth. Provider   triamcinolone cream (KENALOG) 0.1 % Apply 1 Application topically 2 (two) times daily. 30 g Johnie Flaming A, NP      PDMP not reviewed this encounter.   Johnie Flaming A, NP 03/12/24 1816

## 2024-03-15 DIAGNOSIS — F4310 Post-traumatic stress disorder, unspecified: Secondary | ICD-10-CM | POA: Diagnosis not present

## 2024-03-16 DIAGNOSIS — F4310 Post-traumatic stress disorder, unspecified: Secondary | ICD-10-CM | POA: Diagnosis not present

## 2024-03-22 DIAGNOSIS — F4310 Post-traumatic stress disorder, unspecified: Secondary | ICD-10-CM | POA: Diagnosis not present

## 2024-03-29 DIAGNOSIS — F4310 Post-traumatic stress disorder, unspecified: Secondary | ICD-10-CM | POA: Diagnosis not present

## 2024-04-05 DIAGNOSIS — F4310 Post-traumatic stress disorder, unspecified: Secondary | ICD-10-CM | POA: Diagnosis not present

## 2024-04-12 DIAGNOSIS — F4310 Post-traumatic stress disorder, unspecified: Secondary | ICD-10-CM | POA: Diagnosis not present

## 2024-04-19 DIAGNOSIS — F4310 Post-traumatic stress disorder, unspecified: Secondary | ICD-10-CM | POA: Diagnosis not present

## 2024-04-26 ENCOUNTER — Encounter: Payer: Self-pay | Admitting: Family

## 2024-04-26 ENCOUNTER — Ambulatory Visit: Admitting: Family

## 2024-04-26 VITALS — BP 121/80 | HR 72 | Temp 97.7°F | Ht 61.0 in | Wt 158.2 lb

## 2024-04-26 DIAGNOSIS — R208 Other disturbances of skin sensation: Secondary | ICD-10-CM | POA: Diagnosis not present

## 2024-04-26 DIAGNOSIS — R202 Paresthesia of skin: Secondary | ICD-10-CM

## 2024-04-26 DIAGNOSIS — Z0000 Encounter for general adult medical examination without abnormal findings: Secondary | ICD-10-CM | POA: Diagnosis not present

## 2024-04-26 DIAGNOSIS — F4310 Post-traumatic stress disorder, unspecified: Secondary | ICD-10-CM | POA: Diagnosis not present

## 2024-04-26 DIAGNOSIS — I10 Essential (primary) hypertension: Secondary | ICD-10-CM | POA: Diagnosis not present

## 2024-04-26 DIAGNOSIS — Z1231 Encounter for screening mammogram for malignant neoplasm of breast: Secondary | ICD-10-CM

## 2024-04-26 LAB — LIPID PANEL
Cholesterol: 185 mg/dL (ref 0–200)
HDL: 59.3 mg/dL (ref >39.00)
LDL Cholesterol: 109 mg/dL — ABNORMAL HIGH (ref 0–99)
NonHDL: 125.37
Total CHOL/HDL Ratio: 3
Triglycerides: 82 mg/dL (ref 0.0–149.0)
VLDL: 16.4 mg/dL (ref 0.0–40.0)

## 2024-04-26 LAB — CBC WITH DIFFERENTIAL/PLATELET
Basophils Absolute: 0 K/uL (ref 0.0–0.1)
Basophils Relative: 0.3 % (ref 0.0–3.0)
Eosinophils Absolute: 0.2 K/uL (ref 0.0–0.7)
Eosinophils Relative: 3.7 % (ref 0.0–5.0)
HCT: 36.3 % (ref 36.0–46.0)
Hemoglobin: 12 g/dL (ref 12.0–15.0)
Lymphocytes Relative: 36.7 % (ref 12.0–46.0)
Lymphs Abs: 2.2 K/uL (ref 0.7–4.0)
MCHC: 33.1 g/dL (ref 30.0–36.0)
MCV: 91.2 fl (ref 78.0–100.0)
Monocytes Absolute: 0.4 K/uL (ref 0.1–1.0)
Monocytes Relative: 6.4 % (ref 3.0–12.0)
Neutro Abs: 3.1 K/uL (ref 1.4–7.7)
Neutrophils Relative %: 52.9 % (ref 43.0–77.0)
Platelets: 308 K/uL (ref 150.0–400.0)
RBC: 3.98 Mil/uL (ref 3.87–5.11)
RDW: 14 % (ref 11.5–15.5)
WBC: 5.9 K/uL (ref 4.0–10.5)

## 2024-04-26 LAB — COMPREHENSIVE METABOLIC PANEL WITH GFR
ALT: 16 U/L (ref 0–35)
AST: 16 U/L (ref 0–37)
Albumin: 4.4 g/dL (ref 3.5–5.2)
Alkaline Phosphatase: 62 U/L (ref 39–117)
BUN: 10 mg/dL (ref 6–23)
CO2: 30 meq/L (ref 19–32)
Calcium: 9.6 mg/dL (ref 8.4–10.5)
Chloride: 102 meq/L (ref 96–112)
Creatinine, Ser: 0.61 mg/dL (ref 0.40–1.20)
GFR: 111.84 mL/min (ref >60.00)
Glucose, Bld: 98 mg/dL (ref 70–99)
Potassium: 4.1 meq/L (ref 3.5–5.1)
Sodium: 139 meq/L (ref 135–145)
Total Bilirubin: 0.3 mg/dL (ref 0.2–1.2)
Total Protein: 7.6 g/dL (ref 6.0–8.3)

## 2024-04-26 LAB — TSH: TSH: 1.55 u[IU]/mL (ref 0.35–5.50)

## 2024-04-26 MED ORDER — AMLODIPINE BESYLATE 2.5 MG PO TABS: 2.5000 mg | ORAL_TABLET | Freq: Every day | ORAL | 5 refills | Status: AC

## 2024-04-26 NOTE — Progress Notes (Signed)
 Phone [phone]  Subjective:   Patient is a 38 y.o. female presenting for annual physical.    Chief Complaint  Patient presents with   Annual Exam    Fasting w/ labs   Hypertension  Discussed the use of AI scribe software for clinical note transcription with the patient, who gave verbal consent to proceed.  History of Present Illness   Natasha [name] is a 38 year old female who presents for an annual physical exam.  She is fasting for the visit. Pap smears are current, and a mammogram is being ordered today. She is considering a colonoscopy as she is now 68, though there is no family history of colon cancer.  Menstrual cycles occur twice a month sometimes, lasting about five days with heavy flow initially. She has been previously informed of low iron levels.  Alcohol consumption is rare and social. She does not use vaping or THC. She engages in daily casual walking for exercise.  Amlodipine  2.5 mg is taken daily for blood pressure, which she monitors at home when feeling unwell. Blood pressure remains stable on this dose.  Numbness and tingling occur in her right hand, occasionally in the left, primarily in the mornings after awakening, improving throughout the day. This began after starting amlodipine  which she takes in the morning. There is no neck pain or cold sensation in her hands. She is not working and does not perform repetitive hand activities other than home activities and caring for her children. She denies any associated pain in hands or arms.     See problem oriented charting- ROS- full  review of systems was completed and negative except for what is noted in HPI above.  The following were reviewed and entered/updated in epic: Past Medical History:  Diagnosis Date   Gestational diabetes    Gestational diabetes mellitus (GDM), antepartum 06/03/2017   [x]  aspirin  06/03/17   History of postpartum depression, currently pregnant 02/21/2011   History of preterm delivery  01/19/2017   3 preterm deliveries Had [facility] 2012 with del at 36 wks Agrees to [facility] this time   Hypertension    Post partum depression 02/21/2011   Patient Active Problem List   Diagnosis Date Noted   Essential (primary) hypertension 11/26/2023   Alpha thalassemia silent carrier 06/15/2020   History of four preterm deliveries, currently pregnant 05/03/2020   Past Surgical History:  Procedure Laterality Date   IUD REMOVAL     LAPAROSCOPY  02/06/2011   Procedure: LAPAROSCOPY OPERATIVE;  Surgeon: Gloris DELENA Hugger, MD;  Location: [facility];  Service: Gynecology;  Laterality: N/A;  Removal of Mirena IUD from sigmoid serosa, enterolysis, proctoscopy   PROCTOSCOPY  02/06/2011   Procedure: PROCTOSCOPY;  Surgeon: Elspeth KYM Schultze, MD;  Location: [facility];  Service: General;  Laterality: N/A;   TUBAL LIGATION Bilateral 10/25/2020   Procedure: POST PARTUM TUBAL LIGATION;  Surgeon: Alger Gong, MD;  [facility]: [facility];  Service: Gynecology;  Laterality: Bilateral;    Family History  Problem Relation Age of Onset   Hypertension Maternal Grandmother    Hypertension Maternal Grandfather    Hypertension Paternal Grandmother    Hypertension Paternal Grandfather     Medications- reviewed and updated Current Outpatient Medications  Medication Sig Dispense Refill   amLODipine  (NORVASC ) 2.5 MG tablet Take 1 tablet (2.5 mg total) by mouth daily. 30 tablet 5   triamcinolone  cream (KENALOG ) 0.1 % Apply 1 Application topically 2 (two) times daily. 30 g 0   No current facility-administered  medications for this visit.   Allergies-reviewed and updated No Known Allergies  Social History   Social History Narrative   Not on file   Objective:  BP 121/80 (BP Location: Left Arm, Patient Position: Sitting, Cuff Size: Large)   Pulse 72   Temp 97.7 F (36.5 C) (Temporal)   Ht 5' 1 (1.549 m)   Wt 158 lb 4 oz (71.8 kg)   LMP 04/21/2024 (Exact Date)   SpO2 98%   BMI 29.90 kg/m  Physical Exam Vitals  and nursing note reviewed.  Constitutional:      Appearance: Normal appearance.  HENT:     Head: Normocephalic.     Right Ear: Tympanic membrane normal.     Left Ear: Tympanic membrane normal.     Nose: Nose normal.     Mouth/Throat:     Mouth: Mucous membranes are moist.  Eyes:     Pupils: Pupils are equal, round, and reactive to light.  Cardiovascular:     Rate and Rhythm: Normal rate and regular rhythm.  Pulmonary:     Effort: Pulmonary effort is normal.     Breath sounds: Normal breath sounds.  Musculoskeletal:        General: Normal range of motion.     Cervical back: Normal range of motion.  Lymphadenopathy:     Cervical: No cervical adenopathy.  Skin:    General: Skin is warm and dry.  Neurological:     Mental Status: She is alert.  Psychiatric:        Mood and Affect: Mood normal.        Behavior: Behavior normal.     Assessment and Plan   Health Maintenance counseling: 1. Anticipatory guidance: Patient counseled regarding regular dental exams q6 months, eye exams,  avoiding smoking and second hand smoke, limiting alcohol to 1 beverage per day, no illicit drugs.   2. Risk factor reduction:  Advised patient of need for regular exercise and diet rich with fruits and vegetables to reduce risk of heart attack and stroke.  Wt Readings from Last 3 Encounters:  04/26/24 158 lb 4 oz (71.8 kg)  03/12/24 151 lb (68.5 kg)  11/26/23 147 lb 3.2 oz (66.8 kg)   3. Immunizations/screenings/ancillary studies Immunization History  Administered Date(s) Administered   Influenza,inj,Quad PF,6+ Mos 05/05/2017   Influenza-Unspecified 05/05/2017   Tdap 07/02/2013, 05/20/2017, 09/25/2020   Health Maintenance Due  Topic Date Due   Hepatitis B Vaccines 19-59 Average Risk (1 of 3 - 19+ 3-dose series) Never done   HPV VACCINES (1 - 3-dose SCDM series) Never done   Mammogram  11/29/2023    4. Cervical cancer screening- due 2029 5. Breast cancer screening-  mammogram ordered today 6.  Colon cancer screening -  will defer today 7. Skin cancer screening- advised regular sunscreen use. Denies worrisome, changing, or new skin lesions.  8. Birth control/STD check- tubal ligation - irregular cycles, about 5d, heavy then light 9. Osteoporosis screening- N/A 10. Alcohol screening: socially 11. Smoking associated screening (lung cancer screening, AAA screen 65-75, UA)- non- smoker 12. Exercise- walking most days  Assessment and Plan    Adult Wellness Visit Routine wellness visit focused on screenings and lifestyle modifications. No family history of colon cancer, screening may be considered in a year or two. Emphasized exercise, healthy diet, and dental health. - Order mammogram  today. - Discussed colonoscopy screening options. - Encourage regular exercise to increase heart rate for better health benefit vs casual walking. - Advised on  healthy diet, avoiding processed foods and sweets. - Encourage continued brushing and flossing for dental health.  Hand numbness and tingling Intermittent numbness and tingling in the right hand, possibly related to amlodipine  or carpal tunnel syndrome possibly. Discussed potential causes and management strategies, including wrist braces at night. - Recommend wearing wrist braces with a hard underplate at night for two weeks. - Advised on avoiding tight gripping of objects. - Consider wearing wrist braces during the day if symptoms persist. - F/U prn if sx are persisting  Essential hypertension Well-controlled on amlodipine  2.5 mg daily. Discussed potential side effects and switching dosing time to nighttime. - Continue amlodipine  2.5 mg daily. - Switch amlodipine  dosing to nighttime to see if any change with hand numbness. - Send prescription to [facility]. - F/U in 6 mos or sooner if sx persist     Recommended follow up:  Return in about 6 months (around 10/24/2024) for HTN, med refills. No future appointments.  Lab/Order associations:   fasting  Lucius Krabbe, NP

## 2024-04-26 NOTE — Patient Instructions (Addendum)
 It was very nice to see you today!  I will review your lab results via MyChart in a few days.  As discussed, look for wrist braces over-the-counter near where the Ace wrap's are, you want them with a hard plate on the underside of your wrist to keep from bending your hand.  Wear these while you sleep during the night for 2 weeks and see if your hand numbness has improved.  You can also wear them during the day as able.  Your look great! Stay well! Increase your cardio exercise!  Schedule a 6 month follow up visit for refills.     PLEASE NOTE:  If you had any lab tests please let us  know if you have not heard back within a few days. You may see your results on MyChart before we have a chance to review them but we will give you a call once they are reviewed by us . If we ordered any referrals today, please let us  know if you have not heard from their office within the next week.

## 2024-04-26 NOTE — Assessment & Plan Note (Signed)
 Well-controlled on amlodipine  2.5 mg daily. Discussed potential side effects and switching dosing time to nighttime. - Continue amlodipine  2.5 mg daily. - Switch amlodipine  dosing to nighttime to see if any change with hand numbness. - Send prescription to [facility]. - F/U in 6 mos or sooner if sx persist

## 2024-04-27 ENCOUNTER — Ambulatory Visit: Payer: Self-pay | Admitting: Family

## 2024-05-03 DIAGNOSIS — F4310 Post-traumatic stress disorder, unspecified: Secondary | ICD-10-CM | POA: Diagnosis not present

## 2024-05-10 DIAGNOSIS — F4310 Post-traumatic stress disorder, unspecified: Secondary | ICD-10-CM | POA: Diagnosis not present

## 2024-05-10 NOTE — Telephone Encounter (Signed)
 Pt's chart was accessed due to pt being on a backlogged list of patients needing assistance with PCP placement.  Per chart review, pt has since been seen by PCP. No further action taken at this time.

## 2024-05-17 DIAGNOSIS — F4310 Post-traumatic stress disorder, unspecified: Secondary | ICD-10-CM | POA: Diagnosis not present

## 2024-05-24 DIAGNOSIS — F4310 Post-traumatic stress disorder, unspecified: Secondary | ICD-10-CM | POA: Diagnosis not present

## 2024-05-31 DIAGNOSIS — F4310 Post-traumatic stress disorder, unspecified: Secondary | ICD-10-CM | POA: Diagnosis not present

## 2024-06-07 DIAGNOSIS — F4310 Post-traumatic stress disorder, unspecified: Secondary | ICD-10-CM | POA: Diagnosis not present

## 2024-06-14 DIAGNOSIS — F4310 Post-traumatic stress disorder, unspecified: Secondary | ICD-10-CM | POA: Diagnosis not present

## 2024-06-21 DIAGNOSIS — F4310 Post-traumatic stress disorder, unspecified: Secondary | ICD-10-CM | POA: Diagnosis not present

## 2024-06-27 ENCOUNTER — Ambulatory Visit
Admission: RE | Admit: 2024-06-27 | Discharge: 2024-06-27 | Disposition: A | Source: Ambulatory Visit | Attending: Family | Admitting: Family

## 2024-06-27 DIAGNOSIS — Z1231 Encounter for screening mammogram for malignant neoplasm of breast: Secondary | ICD-10-CM

## 2024-06-28 ENCOUNTER — Other Ambulatory Visit: Payer: Self-pay | Admitting: Family

## 2024-06-28 DIAGNOSIS — N6452 Nipple discharge: Secondary | ICD-10-CM

## 2024-06-28 DIAGNOSIS — F4310 Post-traumatic stress disorder, unspecified: Secondary | ICD-10-CM | POA: Diagnosis not present

## 2024-07-05 DIAGNOSIS — F4310 Post-traumatic stress disorder, unspecified: Secondary | ICD-10-CM | POA: Diagnosis not present

## 2024-07-09 ENCOUNTER — Ambulatory Visit (HOSPITAL_COMMUNITY)
Admission: EM | Admit: 2024-07-09 | Discharge: 2024-07-09 | Disposition: A | Discharge status: Home / Self Care | Attending: Emergency Medicine | Admitting: Emergency Medicine

## 2024-07-09 ENCOUNTER — Encounter (HOSPITAL_COMMUNITY): Payer: Self-pay

## 2024-07-09 DIAGNOSIS — Z113 Encounter for screening for infections with a predominantly sexual mode of transmission: Secondary | ICD-10-CM

## 2024-07-09 DIAGNOSIS — N898 Other specified noninflammatory disorders of vagina: Secondary | ICD-10-CM

## 2024-07-09 LAB — HIV ANTIBODY (ROUTINE TESTING W REFLEX): HIV Screen 4th Generation wRfx: NONREACTIVE

## 2024-07-09 NOTE — ED Provider Notes (Signed)
 [facility]    CSN: [id number] Arrival date & time: 07/09/24  1005      History   Chief Complaint Chief Complaint  Patient presents with   Vaginal Discharge    HPI Mykenzi Vanzile Maestre is a 38 y.o. female.   Patient presents with abnormal vaginal discharge that began 3 days ago.  Patient denies vaginal pain, itching, irritation, or abnormal vaginal bleeding.  Patient also denies pelvic pain, abdominal pain, dysuria, hematuria, and urinary frequency.  Patient reports that she is sexually active and would like STD testing just to be sure.  Patient denies any known exposures to STDs.  LMP 11/22.  The history is provided by the patient and medical records.  Vaginal Discharge   Past Medical History:  Diagnosis Date   Gestational diabetes    Gestational diabetes mellitus (GDM), antepartum 06/03/2017   [x]  aspirin  06/03/17   History of four preterm deliveries, currently pregnant 05/03/2020   Will get Makena this pregnancy  Serial cervical length scans at [facility]  16w 4.1 cm  Initiated Makena at [redacted]w[redacted]d.  Cervical length 4cm at [redacted]w[redacted]d.     History of postpartum depression, currently pregnant 02/21/2011   History of preterm delivery 01/19/2017   3 preterm deliveries Had [facility] 2012 with del at 36 wks Agrees to [facility] this time   Hypertension    Post partum depression 02/21/2011    Patient Active Problem List   Diagnosis Date Noted   Essential (primary) hypertension 11/26/2023   Alpha thalassemia silent carrier 06/15/2020    Past Surgical History:  Procedure Laterality Date   IUD REMOVAL     LAPAROSCOPY  02/06/2011   Procedure: LAPAROSCOPY OPERATIVE;  Surgeon: Gloris DELENA Hugger, MD;  Location: [facility];  Service: Gynecology;  Laterality: N/A;  Removal of Mirena IUD from sigmoid serosa, enterolysis, proctoscopy   PROCTOSCOPY  02/06/2011   Procedure: PROCTOSCOPY;  Surgeon: Elspeth KYM Schultze, MD;  Location: [facility];  Service: General;  Laterality: N/A;   TUBAL LIGATION Bilateral 10/25/2020    Procedure: POST PARTUM TUBAL LIGATION;  Surgeon: Alger Gong, MD;  [facility];  Service: Gynecology;  Laterality: Bilateral;    OB History     Gravida  6   Para  5   Term  0   Preterm  5   AB  1   Living  5      SAB  1   IAB  0   Ectopic  0   Multiple  0   Live Births  5            Home Medications    Prior to Admission medications  Medication Sig Start Date End Date Taking? Authorizing Provider  amLODipine  (NORVASC ) 2.5 MG tablet Take 1 tablet (2.5 mg total) by mouth daily. 04/26/24 05/26/24  Lucius Krabbe, NP    Family History Family History  Problem Relation Age of Onset   Hypertension Maternal Grandmother    Hypertension Maternal Grandfather    Hypertension Paternal Grandmother    Hypertension Paternal Grandfather    Breast cancer Neg Hx     Social History Social History[1]   Allergies   Patient has no known allergies.   Review of Systems Review of Systems  Genitourinary:  Positive for vaginal discharge.   Per HPI  Physical Exam Triage Vital Signs ED Triage Vitals [07/09/24 1112]  Encounter Vitals Group     BP 115/66     Girls Systolic BP Percentile  Girls Diastolic BP Percentile      Boys Systolic BP Percentile      Boys Diastolic BP Percentile      Pulse Rate 81     Resp 16     Temp 99.3 F (37.4 C)     Temp Source Oral     SpO2 96 %     Weight      Height      Head Circumference      Peak Flow      Pain Score 0     Pain Loc      Pain Education      Exclude from Growth Chart    No data found.  Updated Vital Signs BP 115/66 (BP Location: Left Arm)   Pulse 81   Temp 99.3 F (37.4 C) (Oral)   Resp 16   LMP 06/18/2024 (Exact Date)   SpO2 96%   Visual Acuity Right Eye Distance:   Left Eye Distance:   Bilateral Distance:    Right Eye Near:   Left Eye Near:    Bilateral Near:     Physical Exam Vitals and nursing note reviewed.  Constitutional:      General: She is awake. She is not  in acute distress.    Appearance: Normal appearance. She is well-developed and well-groomed. She is not ill-appearing.  Genitourinary:    Comments: Exam deferred Skin:    General: Skin is warm and dry.  Neurological:     Mental Status: She is alert.  Psychiatric:        Behavior: Behavior is cooperative.      UC Treatments / Results  Labs (all labs ordered are listed, but only abnormal results are displayed) Labs Reviewed  HIV ANTIBODY (ROUTINE TESTING W REFLEX)  SYPHILIS: RPR W/REFLEX TO RPR TITER AND TREPONEMAL ANTIBODIES, TRADITIONAL SCREENING AND DIAGNOSIS ALGORITHM  CERVICOVAGINAL ANCILLARY ONLY    EKG   Radiology No results found.  Procedures Procedures (including critical care time)  Medications Ordered in UC Medications - No data to display  Initial Impression / Assessment and Plan / UC Course  I have reviewed the triage vital signs and the nursing notes.  Pertinent labs & imaging results that were available during my care of the patient were reviewed by me and considered in my medical decision making (see chart for details).     Patient is overall well-appearing.  Vitals are stable.  GU exam deferred.  Patient perform self swab for STD/STI.  HIV and RPR ordered.  Discussed follow-up and return precautions. Final Clinical Impressions(s) / UC Diagnoses   Final diagnoses:  Vaginal discharge  Screen for STD (sexually transmitted disease)     Discharge Instructions      Your results will come back over the next few days and someone will call if results are positive and require treatment.  Return here as needed.    ED Prescriptions   None    PDMP not reviewed this encounter.    [1]  Social History Tobacco Use   Smoking status: Never   Smokeless tobacco: Never  Vaping Use   Vaping status: Never Used  Substance Use Topics   Alcohol use: No   Drug use: No     Johnie Rumaldo LABOR, NP 07/09/24 1131

## 2024-07-09 NOTE — Discharge Instructions (Signed)
 Your results will come back over the next few days and someone will call if results are positive and require treatment.  Return here as needed.

## 2024-07-09 NOTE — ED Triage Notes (Signed)
 Patient c/o a white vaginal discharge x 3 days.

## 2024-07-10 LAB — SYPHILIS: RPR W/REFLEX TO RPR TITER AND TREPONEMAL ANTIBODIES, TRADITIONAL SCREENING AND DIAGNOSIS ALGORITHM: RPR Ser Ql: NONREACTIVE

## 2024-07-11 ENCOUNTER — Ambulatory Visit (HOSPITAL_COMMUNITY): Payer: Self-pay

## 2024-07-11 LAB — CERVICOVAGINAL ANCILLARY ONLY
Bacterial Vaginitis (gardnerella): POSITIVE — AB
Candida Glabrata: NEGATIVE
Candida Vaginitis: NEGATIVE
Chlamydia: NEGATIVE
Comment: NEGATIVE
Comment: NEGATIVE
Comment: NEGATIVE
Comment: NEGATIVE
Comment: NEGATIVE
Comment: NORMAL
Neisseria Gonorrhea: NEGATIVE
Trichomonas: NEGATIVE

## 2024-07-11 MED ORDER — METRONIDAZOLE 500 MG PO TABS: 500.0000 mg | ORAL_TABLET | Freq: Two times a day (BID) | ORAL | 0 refills | Status: AC

## 2024-07-12 DIAGNOSIS — F4310 Post-traumatic stress disorder, unspecified: Secondary | ICD-10-CM | POA: Diagnosis not present

## 2024-07-12 MED ORDER — METRONIDAZOLE 0.75 % VA GEL: 1.0000 | Freq: Every day | VAGINAL | 0 refills | Status: AC

## 2024-07-12 NOTE — Telephone Encounter (Signed)
 Pt called requesting metrogel . Rx updated.

## 2024-08-08 ENCOUNTER — Encounter (HOSPITAL_COMMUNITY): Payer: Self-pay

## 2024-08-08 ENCOUNTER — Emergency Department (HOSPITAL_COMMUNITY)
Admission: EM | Admit: 2024-08-08 | Discharge: 2024-08-09 | Disposition: A | Attending: Emergency Medicine | Admitting: Emergency Medicine

## 2024-08-08 ENCOUNTER — Other Ambulatory Visit: Payer: Self-pay

## 2024-08-08 ENCOUNTER — Emergency Department (HOSPITAL_COMMUNITY)

## 2024-08-08 DIAGNOSIS — W228XXA Striking against or struck by other objects, initial encounter: Secondary | ICD-10-CM | POA: Insufficient documentation

## 2024-08-08 DIAGNOSIS — S0990XA Unspecified injury of head, initial encounter: Secondary | ICD-10-CM | POA: Insufficient documentation
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
## 2024-08-09 NOTE — ED Provider Notes (Signed)
" °  [facility] Emergency Department Provider Note MRN:  [id number]  Arrival date & time: 08/09/2024     Chief Complaint   Head Injury   History of Present Illness   Natasha [name] is a 39 y.o. year-old female presents to the ED with chief complaint of head area.  She states that the shower curtain rod fell and hit her in the head tonight.  She did not lose consciousness.  She states that she did see stars.  She denies nausea or vomiting.  She states that she wanted to be safe because she has had prior head injuries in the past.  History provided by patient.   Review of Systems  Pertinent positive and negative review of systems noted in HPI.    Physical Exam   Vitals:   08/08/24 2053  BP: (!) 156/104  Pulse: 90  Resp: 20  Temp: 98.3 F (36.8 C)  SpO2: 100%    CONSTITUTIONAL:  non toxic-appearing, NAD NEURO:  Alert and oriented x 3, CN 3-12 grossly intact EYES:  eyes equal and reactive ENT/NECK:  Supple, no stridor, no laceration or significant hematoma or contusion to the head  CARDIO:  normal rate, regular rhythm, appears well-perfused  PULM:  No respiratory distress, CTAB GI/GU:  non-distended,  MSK/SPINE:  No gross deformities, no edema, moves all extremities  SKIN:  no rash, atraumatic   *Additional and/or pertinent findings included in MDM below  Diagnostic and Interventional Summary    EKG Interpretation Date/Time:    Ventricular Rate:    PR Interval:    QRS Duration:    QT Interval:    QTC Calculation:   R Axis:      Text Interpretation:         Labs Reviewed - No data to display  CT Head Wo Contrast  Final Result      Medications - No data to display   Procedures  /  Critical Care Procedures  ED Course and Medical Decision Making  I have reviewed the triage vital signs, the nursing notes, and pertinent available records from the EMR.  Social Determinants Affecting Complexity of Care: Patient has no clinically  significant social determinants affecting this chief complaint..   ED Course:    Medical Decision Making Patient here after having a curtain rod fall and hit her head.  She denies LOC.  CT ordered in triage is reassuring.  No vomiting.  No seizures.  Patient appears stable for discharge.         Consultants: No consultations were needed in caring for this patient.   Treatment and Plan: Emergency department workup does not suggest an emergent condition requiring admission or immediate intervention beyond  what has been performed at this time. The patient is safe for discharge and has  been instructed to return immediately for worsening symptoms, change in  symptoms or any other concerns    Final Clinical Impressions(s) / ED Diagnoses     ICD-10-CM   1. Injury of head, initial encounter  S09.90XA       ED Discharge Orders     None         Discharge Instructions Discussed with and Provided to Patient:   Discharge Instructions   None      Vicky Charleston, PA-C 08/09/24 0159    Palumbo, April, MD 08/09/24 0347  "

## 2024-10-25 ENCOUNTER — Ambulatory Visit: Admitting: Family
